# Patient Record
Sex: Female | Born: 1960 | Race: White | Hispanic: No | Marital: Married | Smoking: Never smoker
Health system: Southern US, Community
[De-identification: ages and names within clinical notes are randomized; demographics above are authoritative.]

## PROBLEM LIST (undated history)

## (undated) DIAGNOSIS — F329 Major depressive disorder, single episode, unspecified: Secondary | ICD-10-CM

## (undated) DIAGNOSIS — D649 Anemia, unspecified: Secondary | ICD-10-CM

## (undated) DIAGNOSIS — I10 Essential (primary) hypertension: Secondary | ICD-10-CM

## (undated) DIAGNOSIS — N83209 Unspecified ovarian cyst, unspecified side: Secondary | ICD-10-CM

## (undated) DIAGNOSIS — E119 Type 2 diabetes mellitus without complications: Secondary | ICD-10-CM

## (undated) DIAGNOSIS — J309 Allergic rhinitis, unspecified: Secondary | ICD-10-CM

## (undated) DIAGNOSIS — F32A Depression, unspecified: Secondary | ICD-10-CM

## (undated) DIAGNOSIS — C259 Malignant neoplasm of pancreas, unspecified: Secondary | ICD-10-CM

## (undated) HISTORY — DX: Malignant neoplasm of pancreas, unspecified: C25.9

## (undated) HISTORY — PX: LAMINECTOMY: SHX219

## (undated) HISTORY — PX: OTHER SURGICAL HISTORY: SHX169

## (undated) HISTORY — DX: Anemia, unspecified: D64.9

## (undated) HISTORY — DX: Unspecified ovarian cyst, unspecified side: N83.209

## (undated) HISTORY — DX: Allergic rhinitis, unspecified: J30.9

## (undated) HISTORY — DX: Major depressive disorder, single episode, unspecified: F32.9

## (undated) HISTORY — DX: Depression, unspecified: F32.A

## (undated) HISTORY — DX: Essential (primary) hypertension: I10

## (undated) HISTORY — DX: Type 2 diabetes mellitus without complications: E11.9

---

## 1998-06-29 ENCOUNTER — Encounter: Payer: Self-pay | Admitting: Obstetrics and Gynecology

## 1998-06-29 ENCOUNTER — Ambulatory Visit (HOSPITAL_COMMUNITY): Admission: RE | Admit: 1998-06-29 | Discharge: 1998-06-29 | Payer: Self-pay | Admitting: Obstetrics and Gynecology

## 1999-11-30 ENCOUNTER — Emergency Department (HOSPITAL_COMMUNITY): Admission: EM | Admit: 1999-11-30 | Discharge: 1999-11-30 | Payer: Self-pay | Admitting: Emergency Medicine

## 2000-09-23 ENCOUNTER — Emergency Department (HOSPITAL_COMMUNITY): Admission: EM | Admit: 2000-09-23 | Discharge: 2000-09-23 | Payer: Self-pay | Admitting: Emergency Medicine

## 2003-03-17 ENCOUNTER — Other Ambulatory Visit: Admission: RE | Admit: 2003-03-17 | Discharge: 2003-03-17 | Payer: Self-pay | Admitting: Obstetrics and Gynecology

## 2003-04-14 ENCOUNTER — Ambulatory Visit (HOSPITAL_COMMUNITY): Admission: RE | Admit: 2003-04-14 | Discharge: 2003-04-14 | Payer: Self-pay | Admitting: Obstetrics and Gynecology

## 2004-04-29 ENCOUNTER — Ambulatory Visit (HOSPITAL_COMMUNITY): Admission: RE | Admit: 2004-04-29 | Discharge: 2004-04-29 | Payer: Self-pay | Admitting: Obstetrics and Gynecology

## 2004-06-17 ENCOUNTER — Ambulatory Visit: Payer: Self-pay | Admitting: Internal Medicine

## 2004-12-09 ENCOUNTER — Other Ambulatory Visit: Admission: RE | Admit: 2004-12-09 | Discharge: 2004-12-09 | Payer: Self-pay | Admitting: Obstetrics and Gynecology

## 2005-08-15 ENCOUNTER — Ambulatory Visit: Payer: Self-pay | Admitting: Internal Medicine

## 2005-10-04 ENCOUNTER — Ambulatory Visit: Payer: Self-pay | Admitting: Internal Medicine

## 2005-12-09 ENCOUNTER — Other Ambulatory Visit: Admission: RE | Admit: 2005-12-09 | Discharge: 2005-12-09 | Payer: Self-pay | Admitting: Obstetrics and Gynecology

## 2006-01-02 ENCOUNTER — Ambulatory Visit (HOSPITAL_COMMUNITY): Admission: RE | Admit: 2006-01-02 | Discharge: 2006-01-02 | Payer: Self-pay | Admitting: Obstetrics and Gynecology

## 2006-06-19 ENCOUNTER — Ambulatory Visit: Payer: Self-pay | Admitting: Internal Medicine

## 2006-10-09 ENCOUNTER — Ambulatory Visit: Payer: Self-pay | Admitting: Internal Medicine

## 2006-10-09 LAB — CONVERTED CEMR LAB
Albumin: 4 g/dL (ref 3.5–5.2)
Alkaline Phosphatase: 42 units/L (ref 39–117)
BUN: 7 mg/dL (ref 6–23)
Basophils Relative: 0.5 % (ref 0.0–1.0)
Eosinophils Absolute: 0.7 10*3/uL — ABNORMAL HIGH (ref 0.0–0.6)
GFR calc Af Amer: 101 mL/min
HDL: 88.1 mg/dL (ref >39.0)
Ketones, ur: NEGATIVE mg/dL
Leukocytes, UA: NEGATIVE
Lymphocytes Relative: 28 % (ref 12.0–46.0)
MCV: 98 fL (ref 78.0–100.0)
Monocytes Relative: 9.4 % (ref 3.0–11.0)
Neutro Abs: 3.9 10*3/uL (ref 1.4–7.7)
Platelets: 359 10*3/uL (ref 150–400)
Potassium: 4 meq/L (ref 3.5–5.1)
Specific Gravity, Urine: 1.01 (ref 1.000–1.03)
Total Protein: 6.4 g/dL (ref 6.0–8.3)
Triglycerides: 54 mg/dL (ref 0–149)
Urine Glucose: NEGATIVE mg/dL
Urobilinogen, UA: 0.2 (ref 0.0–1.0)
VLDL: 11 mg/dL (ref 0–40)

## 2006-11-14 ENCOUNTER — Encounter: Payer: Self-pay | Admitting: Internal Medicine

## 2006-12-13 ENCOUNTER — Other Ambulatory Visit: Admission: RE | Admit: 2006-12-13 | Discharge: 2006-12-13 | Payer: Self-pay | Admitting: Obstetrics and Gynecology

## 2007-01-07 DIAGNOSIS — I10 Essential (primary) hypertension: Secondary | ICD-10-CM

## 2007-01-07 DIAGNOSIS — F418 Other specified anxiety disorders: Secondary | ICD-10-CM

## 2007-01-07 DIAGNOSIS — J309 Allergic rhinitis, unspecified: Secondary | ICD-10-CM | POA: Insufficient documentation

## 2007-08-07 ENCOUNTER — Ambulatory Visit: Payer: Self-pay | Admitting: Internal Medicine

## 2007-08-07 ENCOUNTER — Ambulatory Visit: Payer: Self-pay | Admitting: Cardiovascular Disease

## 2007-08-07 DIAGNOSIS — R079 Chest pain, unspecified: Secondary | ICD-10-CM

## 2007-08-07 DIAGNOSIS — R1011 Right upper quadrant pain: Secondary | ICD-10-CM | POA: Insufficient documentation

## 2007-08-07 LAB — CONVERTED CEMR LAB
CO2: 26 meq/L (ref 19–32)
GFR calc Af Amer: 100 mL/min
Glucose, Bld: 86 mg/dL (ref 70–99)
Potassium: 4.7 meq/L (ref 3.5–5.1)
Sodium: 138 meq/L (ref 135–145)

## 2007-08-08 ENCOUNTER — Telehealth: Payer: Self-pay | Admitting: Internal Medicine

## 2007-09-17 ENCOUNTER — Telehealth: Payer: Self-pay | Admitting: Internal Medicine

## 2007-11-02 ENCOUNTER — Telehealth (INDEPENDENT_AMBULATORY_CARE_PROVIDER_SITE_OTHER): Payer: Self-pay | Admitting: *Deleted

## 2007-11-29 ENCOUNTER — Telehealth (INDEPENDENT_AMBULATORY_CARE_PROVIDER_SITE_OTHER): Payer: Self-pay | Admitting: *Deleted

## 2008-02-01 ENCOUNTER — Ambulatory Visit: Payer: Self-pay | Admitting: Internal Medicine

## 2008-02-01 DIAGNOSIS — R51 Headache: Secondary | ICD-10-CM | POA: Insufficient documentation

## 2008-02-01 DIAGNOSIS — M549 Dorsalgia, unspecified: Secondary | ICD-10-CM | POA: Insufficient documentation

## 2008-02-01 DIAGNOSIS — R519 Headache, unspecified: Secondary | ICD-10-CM | POA: Insufficient documentation

## 2008-06-13 ENCOUNTER — Telehealth: Payer: Self-pay | Admitting: Internal Medicine

## 2008-11-10 LAB — CONVERTED CEMR LAB: Pap Smear: NORMAL

## 2008-11-11 ENCOUNTER — Ambulatory Visit: Payer: Self-pay | Admitting: Internal Medicine

## 2008-11-11 DIAGNOSIS — M25519 Pain in unspecified shoulder: Secondary | ICD-10-CM

## 2008-11-18 ENCOUNTER — Other Ambulatory Visit: Admission: RE | Admit: 2008-11-18 | Discharge: 2008-11-18 | Payer: Self-pay | Admitting: Obstetrics and Gynecology

## 2008-11-24 ENCOUNTER — Ambulatory Visit: Payer: Self-pay | Admitting: Internal Medicine

## 2008-11-25 LAB — CONVERTED CEMR LAB
ALT: 23 units/L (ref 0–35)
AST: 25 units/L (ref 0–37)
Albumin: 3.7 g/dL (ref 3.5–5.2)
Alkaline Phosphatase: 29 units/L — ABNORMAL LOW (ref 39–117)
Basophils Relative: 0.2 % (ref 0.0–3.0)
Cholesterol: 179 mg/dL (ref 0–200)
Eosinophils Relative: 7.9 % — ABNORMAL HIGH (ref 0.0–5.0)
GFR calc non Af Amer: 95.78 mL/min (ref >60)
Glucose, Bld: 74 mg/dL (ref 70–99)
HCT: 37.1 % (ref 36.0–46.0)
Hemoglobin: 12.7 g/dL (ref 12.0–15.0)
Leukocytes, UA: NEGATIVE
Lymphs Abs: 2.2 10*3/uL (ref 0.7–4.0)
Monocytes Relative: 6.9 % (ref 3.0–12.0)
Neutro Abs: 3.2 10*3/uL (ref 1.4–7.7)
Nitrite: NEGATIVE
Potassium: 4.2 meq/L (ref 3.5–5.1)
RBC: 3.8 M/uL — ABNORMAL LOW (ref 3.87–5.11)
RDW: 12 % (ref 11.5–14.6)
Sodium: 139 meq/L (ref 135–145)
TSH: 1.29 microintl units/mL (ref 0.35–5.50)
Total Protein, Urine: NEGATIVE mg/dL
Total Protein: 6.1 g/dL (ref 6.0–8.3)
pH: 6 (ref 5.0–8.0)

## 2008-12-01 ENCOUNTER — Ambulatory Visit (HOSPITAL_COMMUNITY): Admission: RE | Admit: 2008-12-01 | Discharge: 2008-12-01 | Payer: Self-pay | Admitting: Obstetrics and Gynecology

## 2010-02-23 ENCOUNTER — Ambulatory Visit: Admit: 2010-02-23 | Payer: Self-pay | Admitting: Internal Medicine

## 2010-03-07 ENCOUNTER — Encounter: Payer: Self-pay | Admitting: Obstetrics and Gynecology

## 2010-03-07 ENCOUNTER — Encounter: Payer: Self-pay | Admitting: Internal Medicine

## 2010-03-18 ENCOUNTER — Other Ambulatory Visit: Payer: Self-pay | Admitting: Obstetrics and Gynecology

## 2010-03-18 DIAGNOSIS — Z1239 Encounter for other screening for malignant neoplasm of breast: Secondary | ICD-10-CM

## 2010-03-23 ENCOUNTER — Ambulatory Visit: Payer: Self-pay

## 2010-03-23 ENCOUNTER — Other Ambulatory Visit (HOSPITAL_COMMUNITY)
Admission: RE | Admit: 2010-03-23 | Discharge: 2010-03-23 | Disposition: A | Discharge status: Home / Self Care | Payer: BC Managed Care – PPO | Source: Ambulatory Visit | Attending: Obstetrics and Gynecology | Admitting: Obstetrics and Gynecology

## 2010-03-23 ENCOUNTER — Other Ambulatory Visit: Payer: Self-pay | Admitting: Nurse Practitioner

## 2010-03-23 DIAGNOSIS — Z1159 Encounter for screening for other viral diseases: Secondary | ICD-10-CM | POA: Insufficient documentation

## 2010-03-23 DIAGNOSIS — Z01419 Encounter for gynecological examination (general) (routine) without abnormal findings: Secondary | ICD-10-CM | POA: Insufficient documentation

## 2010-03-25 ENCOUNTER — Ambulatory Visit: Payer: Self-pay

## 2010-04-06 ENCOUNTER — Ambulatory Visit
Admission: RE | Admit: 2010-04-06 | Discharge: 2010-04-06 | Disposition: A | Discharge status: Home / Self Care | Payer: BC Managed Care – PPO | Source: Ambulatory Visit | Attending: Obstetrics and Gynecology | Admitting: Obstetrics and Gynecology

## 2010-04-06 ENCOUNTER — Other Ambulatory Visit: Payer: Self-pay | Admitting: Obstetrics and Gynecology

## 2010-04-06 DIAGNOSIS — Z1239 Encounter for other screening for malignant neoplasm of breast: Secondary | ICD-10-CM

## 2010-04-08 ENCOUNTER — Encounter: Payer: Self-pay | Admitting: Internal Medicine

## 2010-04-08 ENCOUNTER — Ambulatory Visit (INDEPENDENT_AMBULATORY_CARE_PROVIDER_SITE_OTHER): Payer: BC Managed Care – PPO | Admitting: Internal Medicine

## 2010-04-08 DIAGNOSIS — Z0000 Encounter for general adult medical examination without abnormal findings: Secondary | ICD-10-CM

## 2010-04-13 NOTE — Assessment & Plan Note (Signed)
Summary: last ov 2010 / check blood pressure/med   Vital Signs:  Patient profile:   50 year old female Height:      62 inches Weight:      159 pounds BMI:     29.19 O2 Sat:      95 % on Room air Temp:     98.1 degrees F oral Pulse rate:   111 / minute BP sitting:   110 / 80  (left arm) Cuff size:   regular  Vitals Entered By: Zella Ball Ewing CMA Duncan Dull) (April 08, 2010 2:04 PM)  O2 Flow:  Room air CC: followup on meds/RE   Primary Care Provider:  Corwin Levins MD  CC:  followup on meds/RE.  History of Present Illness: here for wellness, overall doing ok;  Pt denies CP, worsening sob, doe, wheezing, orthopnea, pnd, worsening LE edema, palps, dizziness or syncope  Pt denies new neuro symptoms such as headache, facial or extremity weakness  Pt denies polydipsia, polyuria   Overall good compliance with meds, trying to follow low chol diet, wt stable, little excercise however  Overall good compliance with meds, and good tolerability.  No fever, wt loss, night sweats, loss of appetite or other constitutional symptoms  Denies worsening depressive symptoms, suicidal ideation, or panic, though has ongoing mild anxiety.  Pt states good ability with ADL's, low fall risk, home safety reviewed and adequate, no significant change in hearing or vision, trying to follow lower chol diet, and occasionally active only with regular excercise.  Has gained some wt back that she had lost previously.  Also with wart lesion to left leg not better with OTC treatment but plans to try again, does not want to see derm at this time.   Preventive Screening-Counseling & Management      Drug Use:  no.    Problems Prior to Update: 1)  Back Pain  (ICD-724.5) 2)  Preventive Health Care  (ICD-V70.0) 3)  Shoulder Pain, Left  (ICD-719.41) 4)  Headache  (ICD-784.0) 5)  Back Pain  (ICD-724.5) 6)  Abdominal Pain Right Upper Quadrant  (ICD-789.01) 7)  Rib Pain  (ICD-786.50) 8)  Allergic Rhinitis  (ICD-477.9) 9)   Hypertension  (ICD-401.9) 10)  Depression  (ICD-311)  Medications Prior to Update: 1)  Diovan 320 Mg Tabs (Valsartan) .... Take 1 Tablet By Mouth Once A Day 2)  Fluticasone Propionate 50 Mcg/act Susp (Fluticasone Propionate) .... Spray 2 Spray Into Both  Nostrils Once A Day 3)  Alprazolam 0.25 Mg  Tabs (Alprazolam) .Marland Kitchen.. 1 By Mouth Two Times A Day Prn 4)  Prozac 20 Mg  Caps (Fluoxetine Hcl) .... Take 1 Tablet By Mouth Once A Day 5)  Wellbutrin Sr 150 Mg  Tb12 (Bupropion Hcl) .... Take 1 Tablet By Mouth Once A Day  Current Medications (verified): 1)  Diovan 320 Mg Tabs (Valsartan) .... Take 1 Tablet By Mouth Once A Day 2)  Fluticasone Propionate 50 Mcg/act Susp (Fluticasone Propionate) .... Spray 2 Spray Into Both  Nostrils Once A Day 3)  Alprazolam 0.25 Mg  Tabs (Alprazolam) .Marland Kitchen.. 1 By Mouth Two Times A Day Prn 4)  Prozac 20 Mg  Caps (Fluoxetine Hcl) .... Take 2 By Mouth Once Daily 5)  Wellbutrin Sr 150 Mg  Tb12 (Bupropion Hcl) .... Take 1 Tablet By Mouth Once A Day 6)  Zyrtec Allergy 10 Mg Tabs (Cetirizine Hcl) .Marland Kitchen.. 1 By Mouth Once Daily 7)  Junel 1/20 1-20 Mg-Mcg Tabs (Norethindrone Acet-Ethinyl Est) .Marland Kitchen.. 1 By Mouth  Once Daily 8)  Centrum Silver  Tabs (Multiple Vitamins-Minerals) .Marland Kitchen.. 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 Drug use-no  Risk Factors: Smoking Status: never (01-18-2007)  Past Medical History: Depression - sees Dr Evelene Croon Hypertension Allergic rhinitis  Social History: Never Smoked Alcohol use-yes (7 drinks per week) Married 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         all otherwise negative per pt -    Physical Exam  General:  alert and overweight-appearing.   Head:  normocephalic and atraumatic.   Eyes:  vision grossly intact, pupils equal, and pupils round.   Ears:  R ear normal and L ear normal.   Nose:  no external deformity and no external erythema.   Mouth:  no gingival abnormalities and pharynx pink and moist.   Neck:  supple and no masses.   Lungs:  normal respiratory effort and normal breath sounds.   Heart:  normal rate and regular rhythm.   Abdomen:  soft, non-tender, and normal bowel sounds.   Msk:  no joint tenderness and no joint swelling.  , no spine tenderness or lumbar paravertebral tedner Extremities:  no edema, no erythema  Neurologic:  cranial nerves II-XII intact, strength normal in all extremities, and sensation intact to light touch.   Skin:  color normal and no rashes.  , has approx 8 mm warty lesin to the left medial mid leg Psych:  not depressed appearing and slightly anxious.     Impression & Recommendations:  Problem # 1:  Preventive Health Care (ICD-V70.0) Overall doing well, age appropriate education and counseling updated, referral for preventive services and immunizations addressed, dietary counseling and smoking status adressed , most recent labs reviewed  I have personally reviewed and have noted 1.The patient's medical and social history 2.Their use of alcohol, tobacco or illicit drugs 3.Their current medications and supplements 4. Functional ability including ADL's,  fall risk, home safety risk, hearing & visual impairment  5.Diet and physical activities 6.Evidence for depression or mood disorders The patients weight, height, BMI  have been recorded in the chart I have made referrals, counseling and provided education to the patient based review of the above   Problem # 2:  HYPERTENSION (ICD-401.9)  Her updated medication list for this problem includes:    Diovan 320 Mg Tabs (Valsartan) .Marland Kitchen... Take 1 tablet by mouth once a day  BP today: 110/80 Prior BP: 122/82 (11/24/2008)  Labs Reviewed: K+: 4.2 (11/24/2008) Creat: : 0.7 (11/24/2008)   Chol: 179 (11/24/2008)   HDL: 86.20 (11/24/2008)   LDL: 82 (11/24/2008)   TG: 53.0 (11/24/2008) stable overall by hx and exam, ok to continue meds/tx as is   Problem # 3:  DEPRESSION (ICD-311)  Her updated medication list for this problem includes:    Alprazolam 0.25 Mg Tabs (Alprazolam) .Marland Kitchen... 1 by mouth two times a  day prn    Prozac 20 Mg Caps (Fluoxetine hcl) .Marland Kitchen... Take 2 by mouth once daily    Wellbutrin Sr 150 Mg Tb12 (Bupropion hcl) .Marland Kitchen... Take 1 tablet by mouth once a day  Discussed treatment options  Patient agrees to call if any worsening of symptoms or thoughts of doing harm arise. Verified that the patient has no suicidal ideation at this time.  stable overall by hx and exam, ok to continue meds/tx as is   Complete Medication List: 1)  Diovan 320 Mg Tabs (Valsartan) .... Take 1 tablet by mouth once a day 2)  Fluticasone Propionate 50 Mcg/act Susp (Fluticasone propionate) .... Spray 2 spray into both  nostrils once a day 3)  Alprazolam 0.25 Mg Tabs (Alprazolam) .Marland Kitchen.. 1 by mouth two times a day prn 4)  Prozac 20 Mg Caps (Fluoxetine hcl) .... Take 2 by mouth once daily 5)  Wellbutrin Sr 150 Mg Tb12 (Bupropion hcl) .... Take 1 tablet by mouth once a day 6)  Zyrtec Allergy 10 Mg Tabs (Cetirizine hcl) .Marland Kitchen.. 1 by mouth once daily 7)  Junel 1/20 1-20 Mg-mcg Tabs (Norethindrone acet-ethinyl est) .Marland Kitchen.. 1 by mouth  once daily 8)  Centrum Silver Tabs (Multiple vitamins-minerals) .Marland Kitchen.. 1 by mouth once daily  Patient Instructions: 1)  Continue all previous medications as before this visit  2)  Please have your blood work drawn at the other lab 3)  Please call the number on the [facility] Card for results of your testing  4)  Please call if you would like referral to Dermatology for the skin lesion to the left leg 5)  Please schedule a follow-up appointment in 1 year, or sooner if needed Prescriptions: DIOVAN 320 MG TABS (VALSARTAN) Take 1 tablet by mouth once a day  #90 x 3   Entered and Authorized by:   Corwin Levins MD   Signed by:   Corwin Levins MD on 04/08/2010   Method used:   Print then Give to Patient   RxID:   [id number]    Orders Added: 1)  Est. Patient 40-64 years [[id number]]

## 2010-04-27 ENCOUNTER — Other Ambulatory Visit: Payer: Self-pay | Admitting: Nurse Practitioner

## 2010-05-17 ENCOUNTER — Telehealth: Payer: Self-pay

## 2010-05-17 DIAGNOSIS — L989 Disorder of the skin and subcutaneous tissue, unspecified: Secondary | ICD-10-CM | POA: Insufficient documentation

## 2010-05-17 NOTE — Telephone Encounter (Signed)
Pt called requesting referral to Dermatology as discussed at last OV (02/23). Pt also says that labs that she was going to have done at GYN office were not able to be drawn. Pt is requesting to have labs done at [facility]. Okay for CPX labs V70.0?

## 2010-05-17 NOTE — Telephone Encounter (Signed)
Referral done  [facility] for cpx labs v70.0 - dahlia to order

## 2010-05-18 NOTE — Telephone Encounter (Signed)
Left message on machine for pt to return my call  

## 2010-05-19 NOTE — Telephone Encounter (Signed)
Pt called back, will expect a call from [facility] with referral info. Pt scheduled for labs 04/05 per her request

## 2010-05-19 NOTE — Telephone Encounter (Signed)
Left message on machine for pt to return my call  

## 2010-05-20 ENCOUNTER — Other Ambulatory Visit (INDEPENDENT_AMBULATORY_CARE_PROVIDER_SITE_OTHER): Payer: 59

## 2010-05-20 DIAGNOSIS — Z0000 Encounter for general adult medical examination without abnormal findings: Secondary | ICD-10-CM

## 2010-05-20 LAB — LIPID PANEL
HDL: 70.1 mg/dL (ref >39.00)
Total CHOL/HDL Ratio: 2

## 2010-05-20 LAB — URINALYSIS, ROUTINE W REFLEX MICROSCOPIC
Bilirubin Urine: NEGATIVE
Hgb urine dipstick: NEGATIVE
Ketones, ur: NEGATIVE
pH: 6 (ref 5.0–8.0)

## 2010-05-20 LAB — CBC WITH DIFFERENTIAL/PLATELET
Basophils Absolute: 0 10*3/uL (ref 0.0–0.1)
Eosinophils Absolute: 0.6 10*3/uL (ref 0.0–0.7)
HCT: 39.3 % (ref 36.0–46.0)
Hemoglobin: 13.6 g/dL (ref 12.0–15.0)
Lymphs Abs: 2 10*3/uL (ref 0.7–4.0)
MCHC: 34.7 g/dL (ref 30.0–36.0)
MCV: 97.9 fl (ref 78.0–100.0)
Monocytes Absolute: 0.4 10*3/uL (ref 0.1–1.0)
Monocytes Relative: 6.9 % (ref 3.0–12.0)
Neutro Abs: 2.6 10*3/uL (ref 1.4–7.7)
Platelets: 297 10*3/uL (ref 150.0–400.0)
RDW: 12.3 % (ref 11.5–14.6)

## 2010-05-20 LAB — HEPATIC FUNCTION PANEL
ALT: 30 U/L (ref 0–35)
AST: 23 U/L (ref 0–37)
Alkaline Phosphatase: 38 U/L — ABNORMAL LOW (ref 39–117)
Bilirubin, Direct: 0.2 mg/dL (ref 0.0–0.3)
Total Bilirubin: 1.1 mg/dL (ref 0.3–1.2)

## 2010-05-20 LAB — BASIC METABOLIC PANEL
CO2: 27 mEq/L (ref 19–32)
Calcium: 8.7 mg/dL (ref 8.4–10.5)
Creatinine, Ser: 0.8 mg/dL (ref 0.4–1.2)
GFR: 83.99 mL/min (ref >60.00)
Glucose, Bld: 71 mg/dL (ref 70–99)
Sodium: 139 mEq/L (ref 135–145)

## 2010-05-20 NOTE — Progress Notes (Signed)
Quick Note:  Voice message left on [employer] system - lab is negative, normal or otherwise stable, pt to continue same tx ______ 

## 2010-06-28 ENCOUNTER — Other Ambulatory Visit: Payer: Self-pay | Admitting: Internal Medicine

## 2010-12-31 ENCOUNTER — Ambulatory Visit (INDEPENDENT_AMBULATORY_CARE_PROVIDER_SITE_OTHER): Payer: 59 | Admitting: Internal Medicine

## 2010-12-31 ENCOUNTER — Encounter: Payer: Self-pay | Admitting: Internal Medicine

## 2010-12-31 VITALS — BP 118/82 | HR 88 | Temp 98.1°F

## 2010-12-31 DIAGNOSIS — M545 Low back pain: Secondary | ICD-10-CM

## 2010-12-31 DIAGNOSIS — N390 Urinary tract infection, site not specified: Secondary | ICD-10-CM

## 2010-12-31 LAB — POCT URINALYSIS DIPSTICK
Bilirubin, UA: NEGATIVE
Glucose, UA: NEGATIVE
Ketones, UA: NEGATIVE
Leukocytes, UA: NEGATIVE
Nitrite, UA: NEGATIVE

## 2010-12-31 MED ORDER — NAPROXEN SODIUM 220 MG PO TABS: 220.0000 mg | ORAL_TABLET | Freq: Two times a day (BID) | ORAL | Status: DC

## 2010-12-31 MED ORDER — NAPROXEN SODIUM 220 MG PO TABS: 440.0000 mg | ORAL_TABLET | Freq: Two times a day (BID) | ORAL | Status: AC

## 2010-12-31 NOTE — Patient Instructions (Signed)
It was good to see you today. No UTI on urine sample today - If you develop worsening symptoms or fever, call and we can reconsider antibiotics, but it does not appear necessary to use antibiotics at this time. Anti-inflammatory medications with food twice a day for the next 3-5 days to help back pain symptoms Call if symptoms unimproved or worse for reevaluation as needed

## 2010-12-31 NOTE — Progress Notes (Signed)
  Subjective:    Patient ID: Tamara [name], female    DOB: 02-07-1961, 50 y.o.   MRN: [id number]  Back Pain This is a new problem. The current episode started in the past 7 days. The problem occurs constantly. The problem is unchanged. The pain is present in the sacro-iliac. The quality of the pain is described as aching and cramping. The pain does not radiate. The pain is at a severity of 5/10. The pain is moderate. The pain is the same all the time. The symptoms are aggravated by position and bending. Stiffness is present all day. Associated symptoms include pelvic pain. Pertinent negatives include no abdominal pain, bladder incontinence, dysuria, fever, headaches, leg pain, numbness, paresthesias, perianal numbness, tingling, weakness or weight loss. Risk factors include poor posture. She has tried heat for the symptoms. The treatment provided mild relief.    Past Medical History  Diagnosis Date  . Depression   . Hypertension   . Allergic rhinitis, mild      Review of Systems  Constitutional: Negative for fever and weight loss.  Gastrointestinal: Negative for abdominal pain.  Genitourinary: Positive for pelvic pain. Negative for bladder incontinence and dysuria.  Musculoskeletal: Positive for back pain.  Neurological: Negative for tingling, weakness, numbness, headaches and paresthesias.       Objective:   Physical Exam BP 118/82  Pulse 88  Temp(Src) 98.1 F (36.7 C) (Oral)  SpO2 97% Constitutional: She appears well-developed and well-nourished. No distress.  Cardiovascular: Normal rate, regular rhythm and normal heart sounds.  No murmur heard. No BLE edema. Pulmonary/Chest: Effort normal and breath sounds normal. No respiratory distress. She has no wheezes.  Abdominal: Soft. Bowel sounds are normal. She exhibits no distension. There is no tenderness. no masses Musculoskeletal: Back: full range of motion of thoracic and lumbar spine. mildly tender to palpation over L SI  joint. DTR's are symmetrically intact. Sensation intact in all dermatomes of the lower extremities. Full strength to manual muscle testing. patient is able to heel toe walk without difficulty and ambulates with antalgic gait Psychiatric: She has a normal mood and affect. Her behavior is normal. Judgment and thought content normal.   Lab Results  Component Value Date   WBC 5.6 05/20/2010   HGB 13.6 05/20/2010   HCT 39.3 05/20/2010   PLT 297.0 05/20/2010   GLUCOSE 71 05/20/2010   CHOL 171 05/20/2010   TRIG 54.0 05/20/2010   HDL 70.10 05/20/2010   LDLCALC 90 05/20/2010   ALT 30 05/20/2010   AST 23 05/20/2010   NA 139 05/20/2010   K 4.9 05/20/2010   CL 103 05/20/2010   CREATININE 0.8 05/20/2010   BUN 11 05/20/2010   CO2 27 05/20/2010   TSH 1.64 05/20/2010   Udip> mod blood (on menses)     Assessment & Plan:  Left low back pain> suspect musculoskeletal versus menses related, possible SI flare No evidence for UTI on urine dip today Advise scheduled anti-inflammatories (otc ok) for next 5 days and heat Hydrates, call if symptoms worse or unimproved

## 2011-01-03 ENCOUNTER — Ambulatory Visit (INDEPENDENT_AMBULATORY_CARE_PROVIDER_SITE_OTHER): Payer: 59 | Admitting: Internal Medicine

## 2011-01-03 ENCOUNTER — Encounter: Payer: Self-pay | Admitting: Internal Medicine

## 2011-01-03 VITALS — BP 110/80 | HR 97 | Temp 97.6°F

## 2011-01-03 DIAGNOSIS — Z0001 Encounter for general adult medical examination with abnormal findings: Secondary | ICD-10-CM | POA: Insufficient documentation

## 2011-01-03 DIAGNOSIS — I10 Essential (primary) hypertension: Secondary | ICD-10-CM

## 2011-01-03 DIAGNOSIS — Z0000 Encounter for general adult medical examination without abnormal findings: Secondary | ICD-10-CM

## 2011-01-03 DIAGNOSIS — M48061 Spinal stenosis, lumbar region without neurogenic claudication: Secondary | ICD-10-CM | POA: Insufficient documentation

## 2011-01-03 DIAGNOSIS — M5416 Radiculopathy, lumbar region: Secondary | ICD-10-CM

## 2011-01-03 DIAGNOSIS — IMO0002 Reserved for concepts with insufficient information to code with codable children: Secondary | ICD-10-CM

## 2011-01-03 DIAGNOSIS — F329 Major depressive disorder, single episode, unspecified: Secondary | ICD-10-CM

## 2011-01-03 DIAGNOSIS — Z719 Counseling, unspecified: Secondary | ICD-10-CM | POA: Insufficient documentation

## 2011-01-03 MED ORDER — OXYCODONE HCL 5 MG PO TABS: 5.0000 mg | ORAL_TABLET | Freq: Four times a day (QID) | ORAL | Status: AC | PRN

## 2011-01-03 MED ORDER — PREDNISONE 10 MG PO TABS: 10.0000 mg | ORAL_TABLET | Freq: Every day | ORAL | Status: AC

## 2011-01-03 MED ORDER — CYCLOBENZAPRINE HCL 5 MG PO TABS: 5.0000 mg | ORAL_TABLET | Freq: Three times a day (TID) | ORAL | Status: AC | PRN

## 2011-01-03 NOTE — Patient Instructions (Addendum)
Take all new medications as prescribed Continue all other medications as before Please call on Friday Nov 23 if not improved, as you will need MRI and Neurosurgury evaluation Please return in 3 mo with Lab testing done 3-5 days before

## 2011-01-07 ENCOUNTER — Telehealth: Payer: Self-pay | Admitting: *Deleted

## 2011-01-07 DIAGNOSIS — M5416 Radiculopathy, lumbar region: Secondary | ICD-10-CM

## 2011-01-07 NOTE — Telephone Encounter (Signed)
Pt advised to expect call form [facility] regarding referral to NS and MRI

## 2011-01-07 NOTE — Telephone Encounter (Signed)
Pt calling back pain is some what improved but still present Your advisement at last visit- Please call on Friday Nov 23 if not improved, as you will need MRI and Neurosurgury evaluation

## 2011-01-07 NOTE — Telephone Encounter (Signed)
This was not unexpected, and will defer to pt  OK for MRI, and NS referrals

## 2011-01-09 ENCOUNTER — Encounter: Payer: Self-pay | Admitting: Internal Medicine

## 2011-01-09 NOTE — Assessment & Plan Note (Signed)
stable overall by hx and exam, most recent data reviewed with pt, and pt to continue medical treatment as before  BP Readings from Last 3 Encounters:  01/03/11 110/80  12/31/10 118/82  04/08/10 110/80

## 2011-01-09 NOTE — Assessment & Plan Note (Signed)
Moderate to severe with some evidence for neuropathy- I think she should have MRI and NS eval, but she wants to try conservative tx with pain med/prednisone trial first

## 2011-01-09 NOTE — Assessment & Plan Note (Signed)
stable overall by hx and exam, most recent data reviewed with pt, and pt to continue medical treatment as before  Lab Results  Component Value Date   WBC 5.6 05/20/2010   HGB 13.6 05/20/2010   HCT 39.3 05/20/2010   PLT 297.0 05/20/2010   GLUCOSE 71 05/20/2010   CHOL 171 05/20/2010   TRIG 54.0 05/20/2010   HDL 70.10 05/20/2010   LDLCALC 90 05/20/2010   ALT 30 05/20/2010   AST 23 05/20/2010   NA 139 05/20/2010   K 4.9 05/20/2010   CL 103 05/20/2010   CREATININE 0.8 05/20/2010   BUN 11 05/20/2010   CO2 27 05/20/2010   TSH 1.64 05/20/2010

## 2011-01-09 NOTE — Progress Notes (Signed)
  Subjective:    Patient ID: Tamara [name], female    DOB: [birth year], 50 y.o.   MRN: [id number]  HPI  Here to f/u- Pt c/o left LBP with increased dull pain for 3 days mod to severe, bowel or bladder change, fever, wt loss,  worsening LE pain/numbness/weakness, gait change or falls, but does have LLE radiation with numbness to the calf, and not better with heat pad, alleve, and better with sitting and rest, worse with getting up from sitting.  Pt denies chest pain, increased sob or doe, wheezing, orthopnea, PND, increased LE swelling, palpitations, dizziness or syncope.  Pt denies new neurological symptoms such as new headache, or facial or extremity weakness or numbness except for the above.   Pt denies polydipsia, polyuria.  And  Denies urinary symptoms such as dysuria, frequency, urgency,or hematuria.  Denies worsening depressive symptoms, suicidal ideation, or panic symptoms.  Overall good compliance with treatment, and good medicine tolerability.  Past Medical History  Diagnosis Date  . Depression   . Hypertension   . Allergic rhinitis, mild    No past surgical history on file.  has an unknown smoking status. She does not have any smokeless tobacco history on file. Her alcohol and drug histories not on file. family history is not on file. No Known Allergies Current Outpatient Prescriptions on File Prior to Visit  Medication Sig Dispense Refill  . buPROPion (WELLBUTRIN SR) 150 MG 12 hr tablet Take 150 mg by mouth 2 (two) times daily.        Marland Kitchen FLUoxetine (PROZAC) 20 MG capsule Take 20 mg by mouth daily.        . fluticasone (FLONASE) 50 MCG/ACT nasal spray instill 2 sprays into each nostril once daily  16 g  9  . naproxen sodium (ALEVE) 220 MG tablet Take 2 tablets (440 mg total) by mouth 2 (two) times daily with a meal. x5 days then as needed      . valsartan (DIOVAN) 320 MG tablet Take 320 mg by mouth daily.         Review of Systems Review of Systems  Constitutional: Negative for  diaphoresis and unexpected weight change.  HENT: Negative for drooling and tinnitus.   Eyes: Negative for photophobia and visual disturbance.  Respiratory: Negative for choking and stridor.   Gastrointestinal: Negative for vomiting and blood in stool.  Genitourinary: Negative for hematuria and decreased urine volume.    Objective:   Physical Exam BP 110/80  Pulse 97  Temp(Src) 97.6 F (36.4 C) (Oral)  SpO2 97%  LMP 12/28/2010 Physical Exam  VS noted, not ill appearing, though uncomfortable Constitutional: Pt appears well-developed and well-nourished.  HENT: Head: Normocephalic.  Right Ear: External ear normal.  Left Ear: External ear normal.  Eyes: Conjunctivae and EOM are normal. Pupils are equal, round, and reactive to light.  Neck: Normal range of motion. Neck supple.  Cardiovascular: Normal rate and regular rhythm.   Pulmonary/Chest: Effort normal and breath sounds normal.  Abd:  Soft, NT, non-distended, + BS Neurological: Pt is alert. No cranial nerve deficit. motor/dtr/gait intact except for LLE distal 4+5 motor, and mild decreased sens to LT, left slr equivocal Skin: Skin is warm. No erythema.  Psychiatric: Pt behavior is normal. Thought content normal. not depressed affect/1+ nervous    Assessment & Plan:

## 2011-01-10 ENCOUNTER — Ambulatory Visit (HOSPITAL_COMMUNITY)
Admission: RE | Admit: 2011-01-10 | Discharge: 2011-01-10 | Disposition: A | Discharge status: Home / Self Care | Payer: 59 | Source: Ambulatory Visit | Attending: Internal Medicine | Admitting: Internal Medicine

## 2011-01-10 DIAGNOSIS — M5126 Other intervertebral disc displacement, lumbar region: Secondary | ICD-10-CM | POA: Insufficient documentation

## 2011-01-10 DIAGNOSIS — M545 Low back pain, unspecified: Secondary | ICD-10-CM | POA: Insufficient documentation

## 2011-01-10 DIAGNOSIS — M5416 Radiculopathy, lumbar region: Secondary | ICD-10-CM

## 2011-01-10 DIAGNOSIS — M25559 Pain in unspecified hip: Secondary | ICD-10-CM | POA: Insufficient documentation

## 2011-01-10 DIAGNOSIS — M79609 Pain in unspecified limb: Secondary | ICD-10-CM | POA: Insufficient documentation

## 2011-01-10 DIAGNOSIS — IMO0002 Reserved for concepts with insufficient information to code with codable children: Secondary | ICD-10-CM | POA: Insufficient documentation

## 2011-01-12 ENCOUNTER — Other Ambulatory Visit (HOSPITAL_COMMUNITY): Payer: 59

## 2011-01-17 ENCOUNTER — Telehealth: Payer: Self-pay

## 2011-01-17 MED ORDER — PREDNISONE 10 MG PO TABS: ORAL_TABLET | ORAL | Status: DC

## 2011-01-17 NOTE — Telephone Encounter (Signed)
Pt states that prednisone has helped with back pain. She has recently ran out and the pain has increased down the length of her entire LT leg with some numbness Pt is requesting a refill of prednisone to last until appt, no date has been made yet.

## 2011-01-17 NOTE — Telephone Encounter (Signed)
Ok for refill this time only

## 2011-01-17 NOTE — Telephone Encounter (Signed)
Pt called to check the status of Neurosurgery referral. Pt also has follow up questions regarding appt.

## 2011-01-17 NOTE — Telephone Encounter (Signed)
Patient informed. 

## 2011-03-17 ENCOUNTER — Other Ambulatory Visit: Payer: Self-pay | Admitting: Internal Medicine

## 2011-06-28 ENCOUNTER — Other Ambulatory Visit: Payer: Self-pay | Admitting: Internal Medicine

## 2012-03-05 ENCOUNTER — Encounter: Payer: Self-pay | Admitting: Internal Medicine

## 2012-03-05 ENCOUNTER — Ambulatory Visit (INDEPENDENT_AMBULATORY_CARE_PROVIDER_SITE_OTHER): Payer: Managed Care, Other (non HMO) | Admitting: Internal Medicine

## 2012-03-05 VITALS — BP 102/70 | HR 84 | Temp 97.7°F | Ht 62.0 in | Wt 154.5 lb

## 2012-03-05 DIAGNOSIS — J209 Acute bronchitis, unspecified: Secondary | ICD-10-CM

## 2012-03-05 DIAGNOSIS — R062 Wheezing: Secondary | ICD-10-CM

## 2012-03-05 DIAGNOSIS — I10 Essential (primary) hypertension: Secondary | ICD-10-CM

## 2012-03-05 DIAGNOSIS — Z0000 Encounter for general adult medical examination without abnormal findings: Secondary | ICD-10-CM

## 2012-03-05 MED ORDER — HYDROCODONE-HOMATROPINE 5-1.5 MG/5ML PO SYRP: 5.0000 mL | ORAL_SOLUTION | Freq: Four times a day (QID) | ORAL | Status: DC | PRN

## 2012-03-05 MED ORDER — LEVOFLOXACIN 250 MG PO TABS: 250.0000 mg | ORAL_TABLET | Freq: Every day | ORAL | Status: DC

## 2012-03-05 MED ORDER — PREDNISONE 10 MG PO TABS: ORAL_TABLET | ORAL | Status: DC

## 2012-03-05 NOTE — Assessment & Plan Note (Signed)
Very mild, liekly related to current infectiuos illness, for predpack asd

## 2012-03-05 NOTE — Patient Instructions (Addendum)
Please take all new medication as prescribed Please continue all other medications as before Thank you for enrolling in MyChart. Please follow the instructions below to securely access your online medical record. MyChart allows you to send messages to your doctor, view your test results, renew your prescriptions, schedule appointments, and more. To Log into My Chart online, please go by [employer] or [employer] to [email or web address], or download the MyChart App from the [employer] of [employer] .  Your Username is: [email or web address] (pass pensato) Please send a practice Message on Mychart later today. Please return in December 2014, or sooner if needed, with Lab testing done 3-5 days before

## 2012-03-05 NOTE — Assessment & Plan Note (Signed)
stable overall by history and exam, recent data reviewed with pt, and pt to continue medical treatment as before,  to f/u any worsening symptoms or concerns BP Readings from Last 3 Encounters:  03/05/12 102/70  01/03/11 110/80  12/31/10 118/82

## 2012-03-05 NOTE — Assessment & Plan Note (Signed)
Mild to mod, for antibx course,  to f/u any worsening symptoms or concerns 

## 2012-03-05 NOTE — Progress Notes (Signed)
  Subjective:    Patient ID: Tamara [name], female    DOB: 1960-07-18, 52 y.o.   MRN: [id number]  HPI  Here with acute onset mild to mod6-7 days ST, HA, general weakness and malaise, with prod cough greenish sputum, but Pt denies chest pain, increased sob or doe, wheezing, orthopnea, PND, increased LE swelling, palpitations, dizziness or syncopel except for onset mild wheeze last PM with mild sense of DOE.  Pt denies polydipsia, polyuria.  Denies worsening depressive symptoms, suicidal ideation, or panic, no other acute symptoms    Past Medical History  Diagnosis Date  . Depression   . Hypertension   . Allergic rhinitis, mild    No past surgical history on file.  has an unknown smoking status. She does not have any smokeless tobacco history on file. Her alcohol and drug histories not on file. family history is not on file. No Known Allergies Current Outpatient Prescriptions on File Prior to Visit  Medication Sig Dispense Refill  . buPROPion (WELLBUTRIN SR) 150 MG 12 hr tablet Take 150 mg by mouth 2 (two) times daily.        Marland Kitchen DIOVAN 320 MG tablet take 1 tablet by mouth once daily  90 tablet  3  . FLUoxetine (PROZAC) 20 MG capsule Take 20 mg by mouth daily.        . fluticasone (FLONASE) 50 MCG/ACT nasal spray instill 2 sprays into each nostril once daily  16 g  9   Review of Systems  Constitutional: Negative for unexpected weight change, or unusual diaphoresis  HENT: Negative for tinnitus.   Eyes: Negative for photophobia and visual disturbance.  Respiratory: Negative for choking and stridor.   Gastrointestinal: Negative for vomiting and blood in stool.  Genitourinary: Negative for hematuria and decreased urine volume.  Musculoskeletal: Negative for acute joint swelling Skin: Negative for color change and wound.  Neurological: Negative for tremors and numbness other than noted  Psychiatric/Behavioral: Negative for decreased concentration or  hyperactivity.       Objective:   Physical Exam BP 102/70  Pulse 84  Temp 97.7 F (36.5 C) (Oral)  Ht 5\' 2"  (1.575 m)  Wt 154 lb 8 oz (70.081 kg)  BMI 28.26 kg/m2  SpO2 96% VS noted,  Constitutional: Pt appears well-developed and well-nourished.  HENT: Head: NCAT.  Right Ear: External ear normal.  Left Ear: External ear normal.  Eyes: Conjunctivae and EOM are normal. Pupils are equal, round, and reactive to light.  Neck: Normal range of motion. Neck supple.  Cardiovascular: Normal rate and regular rhythm.   Pulmonary/Chest: Effort normal and breath sounds with mild bilat decreased BS, + mild wheeze.  - no rales Neurological: Pt is alert. Not confused  Skin: Skin is warm. No erythema.  Psychiatric: Pt behavior is normal. Thought content normal. mild nervous, not depressed affect    Assessment & Plan:

## 2012-03-06 ENCOUNTER — Encounter: Payer: Self-pay | Admitting: Internal Medicine

## 2012-03-12 ENCOUNTER — Ambulatory Visit (INDEPENDENT_AMBULATORY_CARE_PROVIDER_SITE_OTHER): Payer: Managed Care, Other (non HMO) | Admitting: Internal Medicine

## 2012-03-12 ENCOUNTER — Encounter: Payer: Self-pay | Admitting: Internal Medicine

## 2012-03-12 ENCOUNTER — Telehealth: Payer: Self-pay | Admitting: Internal Medicine

## 2012-03-12 VITALS — BP 110/82 | HR 84 | Temp 97.6°F | Ht 62.0 in | Wt 154.5 lb

## 2012-03-12 DIAGNOSIS — J019 Acute sinusitis, unspecified: Secondary | ICD-10-CM

## 2012-03-12 MED ORDER — FLUTICASONE PROPIONATE 50 MCG/ACT NA SUSP: 2.0000 | Freq: Every day | NASAL | Status: DC

## 2012-03-12 MED ORDER — AZITHROMYCIN 250 MG PO TABS: ORAL_TABLET | ORAL | Status: DC

## 2012-03-12 NOTE — Progress Notes (Signed)
HPI  Pt presents to the clinic today with c/o of nasal congestion, fatigue, sinus pain and pressure and cough since 03/05/2012. She was seen by Dr. Jonny Ruiz who diagnosed her with bronchitis. She was prescribed Levaquin and prednisone. She states that she really isn't feeling any better. She has tried using her flonase with some relief. She is coughing constantly but it does not have a color to it. The cough is worse at night. She is using the Hycodan that Dr. Jonny Ruiz gave her at night but it seems to make her a little dizzy. She has been out of work because she is afraid she is contagious. She has had sick contacts.  Review of Systems    Past Medical History  Diagnosis Date  . Depression   . Hypertension   . Allergic rhinitis, mild     History reviewed. No pertinent family history.  History   Social History  . Marital Status: Married    Spouse Name: N/A    Number of Children: N/A  . Years of Education: N/A   Occupational History  . Not on file.   Social History Main Topics  . Smoking status: Never Smoker   . Smokeless tobacco: Not on file  . Alcohol Use: Not on file  . Drug Use: Not on file  . Sexually Active: Not on file   Other Topics Concern  . Not on file   Social History Narrative  . No narrative on file    No Known Allergies   Constitutional: Positive headache, fatigue. Denies fever or abrupt weight changes.  HEENT:  Positive eye pain, pressure behind the eyes, facial pain, nasal congestion and sore throat. Denies eye redness, ear pain, ringing in the ears, wax buildup, runny nose or bloody nose. Respiratory: Positive cough. Denies difficulty breathing or shortness of breath.  Cardiovascular: Denies chest pain, chest tightness, palpitations or swelling in the hands or feet.   No other specific complaints in a complete review of systems (except as listed in HPI above).  Objective:    BP 110/82  Pulse 84  Temp 97.6 F (36.4 C) (Oral)  Ht 5\' 2"  (1.575 m)  Wt 154  lb 8 oz (70.081 kg)  BMI 28.26 kg/m2  SpO2 99% Wt Readings from Last 3 Encounters:  03/12/12 154 lb 8 oz (70.081 kg)  03/05/12 154 lb 8 oz (70.081 kg)  04/08/10 159 lb (72.122 kg)    General: Appears her stated age, well developed, well nourished in NAD. HEENT: Head: normal shape and size; Eyes: sclera white, no icterus, conjunctiva pink, PERRLA and EOMs intact; Ears: Tm's gray and intact, normal light reflex; Nose: mucosa pink and moist, septum midline; Throat/Mouth: + PND. Teeth present, mucosa erythematous and moist, no exudate noted, no lesions or ulcerations noted.  Neck: Mild cervical lymphadenopathy. Neck supple, trachea midline. No massses, lumps or thyromegaly present.  Cardiovascular: Normal rate and rhythm. S1,S2 noted.  No murmur, rubs or gallops noted. No JVD or BLE edema. No carotid bruits noted. Pulmonary/Chest: Normal effort and positive vesicular breath sounds. No respiratory distress. No wheezes, rales or ronchi noted.      Assessment & Plan:   Acute bacterial sinusitis  Can use a Neti Pot which can be purchased from your local drug store. Flonase 2 sprays each nostril for 3 days and then as needed. Azithromax x 5 days  Continue Hycodan cough syrup at night I would get OTC Delsym during the day  RTC as needed or if symptoms persist.

## 2012-03-12 NOTE — Patient Instructions (Signed)

## 2012-03-12 NOTE — Telephone Encounter (Signed)
Patient Information:  Caller Name: Cassanda  Phone: [phone]  Patient: Tamara, [name]  Gender: Female  DOB: [birth year]  Age: 52 Years  PCP: Oliver Barre (Adults only)  Pregnant: No  Office Follow Up:  Does the office need to follow up with this patient?: No  Instructions For The Office: N/A   Symptoms  Reason For Call & Symptoms: Cough; on day 9 of 10 days of antibiotics and not feeling better.  Frequent cough producing clear to yellow sputum.  Nasal drainage clear to yellow. Occasional wheezing. - no worse than when she was seen.  Reviewed Health History In EMR: Yes  Reviewed Medications In EMR: Yes  Reviewed Allergies In EMR: Yes  Reviewed Surgeries / Procedures: Yes  Date of Onset of Symptoms: 03/05/2012  Treatments Tried: Antibiotics, Steroids, Cough Medication.  Treatments Tried Worked: No OB / GYN:  LMP: 02/22/2012  Guideline(s) Used:  Cough  Disposition Per Guideline:   See Today in Office  Reason For Disposition Reached:   Sinus pain persists after using nasal washes and pain medicine > 24 hours  Advice Given:  N/A  Appointment Scheduled:  03/12/2012 10:15:00 Appointment Scheduled Provider:  Nicki Reaper

## 2012-03-16 ENCOUNTER — Telehealth: Payer: Self-pay | Admitting: Internal Medicine

## 2012-03-16 NOTE — Telephone Encounter (Signed)
Message copied by Etheleen Sia on Fri Mar 16, 2012  3:43 PM ------      Message from: Etheleen Sia      Created: Tue Mar 06, 2012  2:34 PM      Regarding: FW: due for ROV in dec 2014       [facility] TO CALL 1-21      ----- Message -----         From: Corwin Levins, MD         Sent: 03/05/2012   5:08 PM           To: Etheleen Sia      Subject: due for ROV in dec 2014                                  Please call pt

## 2012-03-29 ENCOUNTER — Other Ambulatory Visit: Payer: Self-pay | Admitting: Internal Medicine

## 2012-03-31 ENCOUNTER — Other Ambulatory Visit: Payer: Self-pay

## 2012-03-31 NOTE — Telephone Encounter (Signed)
Please advise the patient to schedule an appt for a yearly physical with Dr. Jonny Ruiz. The appointment is needed for additional refills.

## 2012-07-05 ENCOUNTER — Other Ambulatory Visit: Payer: Self-pay | Admitting: Internal Medicine

## 2012-07-25 ENCOUNTER — Other Ambulatory Visit (HOSPITAL_COMMUNITY)
Admission: RE | Admit: 2012-07-25 | Discharge: 2012-07-25 | Disposition: A | Discharge status: Home / Self Care | Payer: Managed Care, Other (non HMO) | Source: Ambulatory Visit | Attending: Obstetrics and Gynecology | Admitting: Obstetrics and Gynecology

## 2012-07-25 ENCOUNTER — Other Ambulatory Visit: Payer: Self-pay

## 2012-07-25 ENCOUNTER — Other Ambulatory Visit: Payer: Self-pay | Admitting: Obstetrics and Gynecology

## 2012-07-25 DIAGNOSIS — Z1231 Encounter for screening mammogram for malignant neoplasm of breast: Secondary | ICD-10-CM

## 2012-07-25 DIAGNOSIS — Z01419 Encounter for gynecological examination (general) (routine) without abnormal findings: Secondary | ICD-10-CM | POA: Insufficient documentation

## 2012-08-29 ENCOUNTER — Ambulatory Visit: Payer: Managed Care, Other (non HMO)

## 2012-11-10 ENCOUNTER — Other Ambulatory Visit: Payer: Self-pay | Admitting: Internal Medicine

## 2012-11-12 NOTE — Telephone Encounter (Signed)
Refill done.  

## 2012-12-20 ENCOUNTER — Other Ambulatory Visit: Payer: Self-pay

## 2013-02-12 ENCOUNTER — Encounter: Payer: Self-pay | Admitting: Internal Medicine

## 2013-02-12 ENCOUNTER — Ambulatory Visit (INDEPENDENT_AMBULATORY_CARE_PROVIDER_SITE_OTHER): Payer: Managed Care, Other (non HMO) | Admitting: Internal Medicine

## 2013-02-12 VITALS — BP 132/80 | HR 91 | Temp 97.2°F | Ht 62.0 in | Wt 137.0 lb

## 2013-02-12 DIAGNOSIS — Z0000 Encounter for general adult medical examination without abnormal findings: Secondary | ICD-10-CM

## 2013-02-12 DIAGNOSIS — H6692 Otitis media, unspecified, left ear: Secondary | ICD-10-CM | POA: Insufficient documentation

## 2013-02-12 DIAGNOSIS — I10 Essential (primary) hypertension: Secondary | ICD-10-CM

## 2013-02-12 DIAGNOSIS — H6690 Otitis media, unspecified, unspecified ear: Secondary | ICD-10-CM

## 2013-02-12 DIAGNOSIS — F329 Major depressive disorder, single episode, unspecified: Secondary | ICD-10-CM

## 2013-02-12 MED ORDER — LEVOFLOXACIN 250 MG PO TABS: 250.0000 mg | ORAL_TABLET | Freq: Every day | ORAL | Status: DC

## 2013-02-12 NOTE — Progress Notes (Signed)
   Subjective:    Patient ID: Tamara [name], female    DOB: 12-Jan-1961, 52 y.o.   MRN: [id number]  HPI   Here with 2-3 days acute onset fever, left ear pain, pressure, headache, popping and crackling, general weakness and malaise, and with mild ST and cough, but pt denies chest pain, wheezing, increased sob or doe, orthopnea, PND, increased LE swelling, palpitations, dizziness or syncope.  Pt denies new neurological symptoms such as new headache, or facial or extremity weakness or numbness   Pt denies polydipsia, polyuria,   Has lost signficant wt, LBP better. Denies worsening depressive symptoms, suicidal ideation, or panic; Past Medical History  Diagnosis Date  . Depression   . Hypertension   . Allergic rhinitis, mild    No past surgical history on file.  reports that she has never smoked. She does not have any smokeless tobacco history on file. Her alcohol and drug histories are not on file. family history is not on file. No Known Allergies Current Outpatient Prescriptions on File Prior to Visit  Medication Sig Dispense Refill  . buPROPion (WELLBUTRIN SR) 150 MG 12 hr tablet Take 150 mg by mouth 2 (two) times daily.        Marland Kitchen DIOVAN 320 MG tablet take 1 tablet by mouth once daily  90 tablet  0  . FLUoxetine (PROZAC) 20 MG capsule Take 20 mg by mouth daily.        . fluticasone (FLONASE) 50 MCG/ACT nasal spray Place 2 sprays into the nose daily.  16 g  9   No current facility-administered medications on file prior to visit.     Review of Systems  Constitutional: Negative for unexpected weight change, or unusual diaphoresis  HENT: Negative for tinnitus.   Eyes: Negative for photophobia and visual disturbance.  Respiratory: Negative for choking and stridor.   Gastrointestinal: Negative for vomiting and blood in stool.  Genitourinary: Negative for hematuria and decreased urine volume.  Musculoskeletal: Negative for acute joint swelling Skin: Negative for color change and wound.    Neurological: Negative for tremors and numbness other than noted  Psychiatric/Behavioral: Negative for decreased concentration or  hyperactivity.       Objective:   Physical Exam BP 132/80  Pulse 91  Temp(Src) 97.2 F (36.2 C) (Oral)  Ht 5\' 2"  (1.575 m)  Wt 137 lb (62.143 kg)  BMI 25.05 kg/m2  SpO2 95% VS noted, mild ill Constitutional: Pt appears well-developed and well-nourished.  HENT: Head: NCAT.  Right Ear: External ear normal.  Left Ear: External ear normal.  Left tm's with severe erythema.  Max sinus areas non tender.  Pharynx with mild erythema, no exudate' Eyes: Conjunctivae and EOM are normal. Pupils are equal, round, and reactive to light.  Neck: Normal range of motion. Neck supple.  Cardiovascular: Normal rate and regular rhythm.   Pulmonary/Chest: Effort normal and breath sounds normal.  Neurological: Pt is alert. Not confused  Skin: Skin is warm. No erythema.  Psychiatric: Pt behavior is normal. Thought content normal. not depressed affect     Assessment & Plan:

## 2013-02-12 NOTE — Assessment & Plan Note (Signed)
stable overall by history and exam, recent data reviewed with pt, and pt to continue medical treatment as before,  to f/u any worsening symptoms or concerns Lab Results  Component Value Date   WBC 5.6 05/20/2010   HGB 13.6 05/20/2010   HCT 39.3 05/20/2010   PLT 297.0 05/20/2010   GLUCOSE 71 05/20/2010   CHOL 171 05/20/2010   TRIG 54.0 05/20/2010   HDL 70.10 05/20/2010   LDLCALC 90 05/20/2010   ALT 30 05/20/2010   AST 23 05/20/2010   NA 139 05/20/2010   K 4.9 05/20/2010   CL 103 05/20/2010   CREATININE 0.8 05/20/2010   BUN 11 05/20/2010   CO2 27 05/20/2010   TSH 1.64 05/20/2010

## 2013-02-12 NOTE — Progress Notes (Signed)
Pre-visit discussion using our clinic review tool. No additional management support is needed unless otherwise documented below in the visit note.  

## 2013-02-12 NOTE — Assessment & Plan Note (Signed)
Mild to mod, for antibx course,  to f/u any worsening symptoms or concerns 

## 2013-02-12 NOTE — Patient Instructions (Addendum)
Please take all new medication as prescribed  - the antibiotic Please continue all other medications as before  You can also take Delsym OTC for cough, and/or Mucinex (or it's generic off brand) for congestion and ear clicking/popping, and tylenol as needed for pain.  Please return in 6 months, or sooner if needed, with Lab testing done 3-5 days before

## 2013-02-12 NOTE — Assessment & Plan Note (Signed)
stable overall by history and exam, recent data reviewed with pt, and pt to continue medical treatment as before,  to f/u any worsening symptoms or concerns BP Readings from Last 3 Encounters:  02/12/13 132/80  03/12/12 110/82  03/05/12 102/70

## 2013-02-20 ENCOUNTER — Other Ambulatory Visit: Payer: Self-pay | Admitting: Internal Medicine

## 2013-03-06 ENCOUNTER — Telehealth: Payer: Self-pay | Admitting: Internal Medicine

## 2013-03-06 MED ORDER — LEVOFLOXACIN 250 MG PO TABS: 250.0000 mg | ORAL_TABLET | Freq: Every day | ORAL | Status: DC

## 2013-03-06 NOTE — Telephone Encounter (Signed)
Patient called stating her symptoms have came back and would like a refill on  levofloxacin (LEVAQUIN) 250 MG tablet  Please advise

## 2013-03-06 NOTE — Telephone Encounter (Signed)
Done erx 

## 2013-03-06 NOTE — Telephone Encounter (Signed)
Patient informed. 

## 2013-04-03 ENCOUNTER — Encounter: Payer: Self-pay | Admitting: Family Medicine

## 2013-04-03 ENCOUNTER — Encounter: Payer: Self-pay | Admitting: Internal Medicine

## 2013-04-03 ENCOUNTER — Ambulatory Visit (INDEPENDENT_AMBULATORY_CARE_PROVIDER_SITE_OTHER): Payer: Managed Care, Other (non HMO) | Admitting: Family Medicine

## 2013-04-03 ENCOUNTER — Ambulatory Visit (INDEPENDENT_AMBULATORY_CARE_PROVIDER_SITE_OTHER): Payer: Managed Care, Other (non HMO) | Admitting: Internal Medicine

## 2013-04-03 VITALS — BP 122/80 | HR 82 | Temp 98.8°F | Resp 16 | Wt 144.0 lb

## 2013-04-03 VITALS — BP 122/80 | HR 82 | Temp 98.8°F | Ht 61.5 in | Wt 144.0 lb

## 2013-04-03 DIAGNOSIS — M7550 Bursitis of unspecified shoulder: Secondary | ICD-10-CM | POA: Insufficient documentation

## 2013-04-03 DIAGNOSIS — M25519 Pain in unspecified shoulder: Secondary | ICD-10-CM

## 2013-04-03 DIAGNOSIS — M25512 Pain in left shoulder: Secondary | ICD-10-CM

## 2013-04-03 DIAGNOSIS — M67919 Unspecified disorder of synovium and tendon, unspecified shoulder: Secondary | ICD-10-CM

## 2013-04-03 DIAGNOSIS — M719 Bursopathy, unspecified: Secondary | ICD-10-CM

## 2013-04-03 DIAGNOSIS — M549 Dorsalgia, unspecified: Secondary | ICD-10-CM

## 2013-04-03 DIAGNOSIS — M25511 Pain in right shoulder: Secondary | ICD-10-CM

## 2013-04-03 MED ORDER — MELOXICAM 15 MG PO TABS: 15.0000 mg | ORAL_TABLET | Freq: Every day | ORAL | Status: DC

## 2013-04-03 NOTE — Progress Notes (Signed)
[name] [address], [place] [address] Phone: [phone] Subjective:    I'm seeing this patient by the request  of:  Cathlean Cower, MD   CC: bilateral shoulder pain.   [id number] Tamara [name] is a 53 y.o. female coming in with complaint of bilateral shoulder pain and upper back pain. 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 Patient also has a past medical history significant for scoliosis. Patient states that she has had upper back pain for quite some time. Patient states that she thinks this is secondary to her breast tissue. Patient is going to discuss breast reduction surgery: See if anything else can be done. Patient states that she is having bilateral shoulder pain. Patient has been taking up swimming since after her lower back surgery and states since that time she has noticed some more difficulty with raising her arms above her head as well as reaching behind her back. Patient states that also reaching to the back seat in her car can be painful. Patient denies any radiation down the arm, denies any numbness or tingling denies any loss of strength.     Past medical history, social, surgical and family history all reviewed in electronic medical record.   Review of Systems: No headache, visual changes, nausea, vomiting, diarrhea, constipation, dizziness, abdominal pain, skin rash, fevers, chills, night sweats, weight loss, swollen lymph nodes, body aches, joint swelling, muscle aches, chest pain, shortness of breath, mood changes.   Objective Blood pressure 122/80, pulse 82, temperature 98.8 F (37.1 C), temperature source Oral, resp. rate 16, weight 144 lb (65.318 kg), SpO2 95.00%.  General: No apparent distress alert and oriented x3 mood and affect normal, dressed appropriately.  HEENT: Pupils equal, extraocular movements intact    Respiratory: Patient's speak in full sentences and does not appear short of breath  Cardiovascular: No lower extremity edema, non tender, no erythema  Skin: Warm dry intact with no signs of infection or rash on extremities or on axial skeleton.  Abdomen: Soft nontender  Neuro: Cranial nerves II through XII are intact, neurovascularly intact in all extremities with 2+ DTRs and 2+ pulses.  Lymph: No lymphadenopathy of posterior or anterior cervical chain or axillae bilaterally.  Gait normal with good balance and coordination.  MSK:  Non tender with full range of motion and good stability and symmetric strength and tone of elbows, wrist, hip, knee and ankles bilaterally.  Back examination patient has some mild levoscoliosis of the thoracic spine Shoulder: Bilateral Inspection reveals no abnormalities, atrophy or asymmetry. Palpation is normal with no tenderness over AC joint or bicipital groove. ROM lacks the last 5 actively and forward flexion as well as internal rotation but does have full range of motion passively. Rotator cuff strength normal throughout. signs of impingement with positive Neer and Hawkin's tests, negative empty can sign. Speeds and Yergason's tests normal. No labral pathology noted with negative Obrien's, negative clunk and good stability. Normal scapular function observed. No painful arc and no drop arm sign. No apprehension sign  Procedure: Real-time Ultrasound Guided Injection of left glenohumeral joint Device: GE Logiq E  Ultrasound guided injection is preferred based studies that show increased duration, increased effect, greater accuracy, decreased procedural pain, increased response rate with ultrasound guided versus blind injection.  Verbal informed consent obtained.  Time-out conducted.  Noted no overlying erythema, induration, or other signs of local infection.  Skin prepped in a sterile fashion.  Local anesthesia: Topical Ethyl chloride.  With sterile  technique and under real time ultrasound guidance:  Joint visualized.  23g 1  inch needle inserted posterior approach. Pictures taken for needle placement. Patient did have injection of 2 cc of 1% lidocaine, 2 cc of 0.5% Marcaine, and 1cc of Kenalog 40 mg/dL. Completed without difficulty  Pain immediately resolved suggesting accurate placement of the medication.  Advised to call if fevers/chills, erythema, induration, drainage, or persistent bleeding.  Images permanently stored and available for review in the ultrasound unit.  Impression: Technically successful ultrasound guided injection. Procedure: Real-time Ultrasound Guided Injection of right glenohumeral joint Device: GE Logiq E  Ultrasound guided injection is preferred based studies that show increased duration, increased effect, greater accuracy, decreased procedural pain, increased response rate with ultrasound guided versus blind injection.  Verbal informed consent obtained.  Time-out conducted.  Noted no overlying erythema, induration, or other signs of local infection.  Skin prepped in a sterile fashion.  Local anesthesia: Topical Ethyl chloride.  With sterile technique and under real time ultrasound guidance:  Joint visualized.  23g 1  inch needle inserted posterior approach. Pictures taken for needle placement. Patient did have injection of 2 cc of 1% lidocaine, 2 cc of 0.5% Marcaine, and 1.0 cc of Kenalog 40 mg/dL. Completed without difficulty  Pain immediately resolved suggesting accurate placement of the medication.  Advised to call if fevers/chills, erythema, induration, drainage, or persistent bleeding.  Images permanently stored and available for review in the ultrasound unit.  Impression: Technically successful ultrasound guided injection.      Impression and Recommendations:     This case required medical decision making of moderate complexity.

## 2013-04-03 NOTE — Patient Instructions (Signed)
There is no charge for your visit with me today  Please see the front desk to be checked in for Dr Tamala Julian, to be seen for shoulder pain  You will be contacted regarding the referral for: plastic surgury, but if your shoulder pain improves with Dr Tamala Julian, you may not need the surgury

## 2013-04-03 NOTE — Assessment & Plan Note (Signed)
?   Bursitis, for sport med referral today

## 2013-04-03 NOTE — Assessment & Plan Note (Signed)
Suspect given small overall body size, and breast size out of proportion, most likely related and for referral to plastic surgury to consider breast reduction surgury

## 2013-04-03 NOTE — Patient Instructions (Signed)
Very nice to meet you and thanks for the tips on [facility].  Try exercises most days of the week Work on posture on wall heels, butt, shoulders and head against wall.  Tennis ball between shoulder blades with sitting.  Meloxicam daily for 10 days then as needed Come back in 2 weeks and we can see how you are doing and try some manipulation if needed.

## 2013-04-03 NOTE — Progress Notes (Signed)
Pre-visit discussion using our clinic review tool. No additional management support is needed unless otherwise documented below in the visit note.  

## 2013-04-03 NOTE — Assessment & Plan Note (Signed)
Patient given exercises to help with her upper back pain. I do think patient's breast tissue does contribute to do think that surgery would be significantly unnecessary. Patient was given postural exercises and discussed changing different position in order to can be helpful. Discuss sleeping position. Patient will return in 2-3 weeks and is continuing to have pain will try osteopathic manipulation.

## 2013-04-03 NOTE — Progress Notes (Signed)
   Subjective:    Patient ID: Tamara [name], female    DOB: Sep 25, 1960, 53 y.o.   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  Worse with sleeping.  But also with ongoing months to a year of bilat upper back aching pain persistent as well, bra straps digging in, questions pendulous breasts (for her body size) Past Medical History  Diagnosis Date  . Depression   . Hypertension   . Allergic rhinitis, mild    No past surgical history on file.  reports that she has never smoked. She does not have any smokeless tobacco history on file. Her alcohol and drug histories are not on file. family history is not on file. No Known Allergies Current Outpatient Prescriptions on File Prior to Visit  Medication Sig Dispense Refill  . buPROPion (WELLBUTRIN SR) 150 MG 12 hr tablet Take 150 mg by mouth 2 (two) times daily.        Marland Kitchen FLUoxetine (PROZAC) 20 MG capsule Take 20 mg by mouth daily.        . fluticasone (FLONASE) 50 MCG/ACT nasal spray Place 2 sprays into the nose daily.  16 g  9  . valsartan (DIOVAN) 320 MG tablet take 1 tablet by mouth once daily  90 tablet  3   No current facility-administered medications on file prior to visit.    Review of Systems Not done in detail    Objective:   Physical Exam BP 122/80  Pulse 82  Temp(Src) 98.8 F (37.1 C) (Oral)  Ht 5' 1.5" (1.562 m)  Wt 144 lb (65.318 kg)  BMI 26.77 kg/m2  SpO2 95% bilat shoulders with straps digging in; nontender bilat bursa Spine nontender       Assessment & Plan:

## 2013-04-03 NOTE — Assessment & Plan Note (Signed)
Patient did have injections as described above. Patient tolerated the procedure very well. Home exercise program given Anti-inflammatory burst Discussed icing protocol Return in 2-3 weeks for further evaluation

## 2013-04-17 ENCOUNTER — Ambulatory Visit: Payer: Managed Care, Other (non HMO) | Admitting: Family Medicine

## 2013-04-29 ENCOUNTER — Ambulatory Visit (INDEPENDENT_AMBULATORY_CARE_PROVIDER_SITE_OTHER): Payer: Managed Care, Other (non HMO) | Admitting: Family Medicine

## 2013-04-29 ENCOUNTER — Encounter: Payer: Self-pay | Admitting: Family Medicine

## 2013-04-29 VITALS — BP 110/80 | HR 76

## 2013-04-29 DIAGNOSIS — M999 Biomechanical lesion, unspecified: Secondary | ICD-10-CM

## 2013-04-29 DIAGNOSIS — M719 Bursopathy, unspecified: Secondary | ICD-10-CM

## 2013-04-29 DIAGNOSIS — M7550 Bursitis of unspecified shoulder: Secondary | ICD-10-CM

## 2013-04-29 DIAGNOSIS — M9981 Other biomechanical lesions of cervical region: Secondary | ICD-10-CM

## 2013-04-29 DIAGNOSIS — M549 Dorsalgia, unspecified: Secondary | ICD-10-CM

## 2013-04-29 DIAGNOSIS — M67919 Unspecified disorder of synovium and tendon, unspecified shoulder: Secondary | ICD-10-CM

## 2013-04-29 MED ORDER — MELOXICAM 15 MG PO TABS: 15.0000 mg | ORAL_TABLET | Freq: Every day | ORAL | Status: DC

## 2013-04-29 NOTE — Assessment & Plan Note (Signed)
Patient has made an 80% improvement and is doing very well. Discussed continuing postural exercises and get a new exercises today. Patient will continue this and come back again in 4 weeks for further evaluation. I would like patient to try titrate off of the meloxicam. Patient and will be evaluated again in 4 weeks. If continuing to have some pain we may reconsider ultrasound.

## 2013-04-29 NOTE — Patient Instructions (Signed)
Good to see you neighbor.  Try exercises 3 times a week Continue to work on posture Y-T-A exercises 10 reps daily.  Come back in 4 weeks.

## 2013-04-29 NOTE — Progress Notes (Signed)
  [name] [address], [place] [address] Phone: [phone] Subjective:    CC: bilateral shoulder pain follow up  Tamara [name] is a 53 y.o. female coming in with complaint of bilateral shoulder pain. Patient was seen previously and was given injections in both shoulders for shoulder bursitis. Patient was given home exercise program and anti-inflammatories. Patient states she is doing approximately 80% better. Patient states her shoulders and heard a regular basis and is able to do all activities of daily living without any significant discomfort. Patient continues to take meloxicam daily though. Patient's upper back pain is still there and is mildly improved. Patient still has significant discomfort at the end of the long day. Denies any radiation down the arms. No new symptoms.     Past medical history, social, surgical and family history all reviewed in electronic medical record.   Review of Systems: No headache, visual changes, nausea, vomiting, diarrhea, constipation, dizziness, abdominal pain, skin rash, fevers, chills, night sweats, weight loss, swollen lymph nodes, body aches, joint swelling, muscle aches, chest pain, shortness of breath, mood changes.   Objective Blood pressure 110/80, pulse 76, SpO2 97.00%.  General: No apparent distress alert and oriented x3 mood and affect normal, dressed appropriately.  HEENT: Pupils equal, extraocular movements intact  Respiratory: Patient's speak in full sentences and does not appear short of breath  Cardiovascular: No lower extremity edema, non tender, no erythema  Skin: Warm dry intact with no signs of infection or rash on extremities or on axial skeleton.  Abdomen: Soft nontender  Neuro: Cranial nerves II through XII are intact, neurovascularly intact in all extremities with 2+ DTRs and 2+ pulses.  Lymph: No lymphadenopathy of posterior or anterior cervical chain or axillae  bilaterally.  Gait normal with good balance and coordination.  MSK:  Non tender with full range of motion and good stability and symmetric strength and tone of elbows, wrist, hip, knee and ankles bilaterally.  Back examination patient has some mild levoscoliosis of the thoracic spine Shoulder: Bilateral Inspection reveals no abnormalities, atrophy or asymmetry. Palpation is normal with no tenderness over AC joint or bicipital groove. ROM is full now Rotator cuff strength normal throughout. signs of impingement with positive Neer and Hawkin's tests, negative empty can sign no change from previous exam Speeds and Yergason's tests normal. No labral pathology noted with negative Obrien's, negative clunk and good stability. Normal scapular function observed. No painful arc and no drop arm sign. No apprehension sign   Osteopathic findings C2 flexed rotated and side bent left C5 flexed rotated and side bent right  Thoracic T3 extended rotated and side bent right T7 extended rotated and side bent left   Impression and Recommendations:     This case required medical decision making of moderate complexity.

## 2013-04-29 NOTE — Assessment & Plan Note (Signed)
Sponge very well to osteopathic manipulation. Patient was given other postural exercises and discussed working position that could be beneficial. Patient will be gardening soon which I think will be very helpful. Discuss continuing multivitamin but also getting vitamin D for could be beneficial. Patient will come back again in 4 weeks for further evaluation.

## 2013-04-29 NOTE — Assessment & Plan Note (Signed)
Decision today to treat with OMT was based on Physical Exam  After verbal consent patient was treated with HVLA and muscle energy techniques in cervical and thoracic areas  Patient tolerated the procedure well with improvement in symptoms  Patient given exercises, stretches and lifestyle modifications  See medications in patient instructions if given  Patient will follow up in 4 weeks  

## 2013-04-29 NOTE — Assessment & Plan Note (Signed)
Decision today to treat with OMT was based on Physical Exam  After verbal consent patient was treated with HVLA and muscle energy techniques in cervical and thoracic areas  Patient tolerated the procedure well with improvement in symptoms  Patient given exercises, stretches and lifestyle modifications  See medications in patient instructions if given  Patient will follow up in 4 weeks

## 2013-06-05 ENCOUNTER — Ambulatory Visit: Payer: Managed Care, Other (non HMO) | Admitting: Family Medicine

## 2013-06-05 DIAGNOSIS — Z0289 Encounter for other administrative examinations: Secondary | ICD-10-CM

## 2013-06-12 ENCOUNTER — Encounter: Payer: Self-pay | Admitting: Family Medicine

## 2013-06-12 ENCOUNTER — Ambulatory Visit (INDEPENDENT_AMBULATORY_CARE_PROVIDER_SITE_OTHER): Payer: Managed Care, Other (non HMO) | Admitting: Family Medicine

## 2013-06-12 VITALS — BP 106/76 | HR 98 | Wt 140.0 lb

## 2013-06-12 DIAGNOSIS — M549 Dorsalgia, unspecified: Secondary | ICD-10-CM

## 2013-06-12 DIAGNOSIS — M9981 Other biomechanical lesions of cervical region: Secondary | ICD-10-CM

## 2013-06-12 DIAGNOSIS — M999 Biomechanical lesion, unspecified: Secondary | ICD-10-CM

## 2013-06-12 NOTE — Assessment & Plan Note (Signed)
Decision today to treat with OMT was based on Physical Exam  After verbal consent patient was treated with HVLA and muscle energy techniques in cervical and thoracic areas  Patient tolerated the procedure well with improvement in symptoms  Patient given exercises, stretches and lifestyle modifications  See medications in patient instructions if given  Patient will follow up in 6 weeks     

## 2013-06-12 NOTE — Assessment & Plan Note (Signed)
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 Discuss again the importance of changing her seated position at work as well as doing a posterolateral size is most is the week. We did discuss potential formal physical therapy which patient Caryl Comes. Patient was shown other exercises that could be beneficial. Patient has a new home workout now. Patient will followup again in 6 weeks. Continues to respond well to osteopathic manipulation.  Spent greater than 25 minutes with patient face-to-face and had greater than 50% of counseling including as described above in assessment and plan.

## 2013-06-12 NOTE — Patient Instructions (Signed)
Good to see you.  Continue the exercises 3 times a week. Or at least 2 times a week Tennis ball duct tape together and lay on it for 5 minutes Lets go another 6 weeks.

## 2013-06-12 NOTE — Progress Notes (Signed)
  [name] [address] [place], [place] [address] Phone: [phone] Subjective:    CC: bilateral shoulder pain follow up, OMT  [id number] Tamara [name] is a 53 y.o. female coming in with complaint of bilateral shoulder pain. Patient was seen previously and was given injections in both shoulders for shoulder bursitis. Patient was given home exercise program and anti-inflammatories. Patient also responded well to osteopathic manipulation. She states that her shoulders are feeling terrific. Patient also states that she's been working on her posture but still has quite significant amount of difficulty. Patient states that she does the exercises occasionally. Denies any significant pain overall.     Past medical history, social, surgical and family history all reviewed in electronic medical record.   Review of Systems: No headache, visual changes, nausea, vomiting, diarrhea, constipation, dizziness, abdominal pain, skin rash, fevers, chills, night sweats, weight loss, swollen lymph nodes, body aches, joint swelling, muscle aches, chest pain, shortness of breath, mood changes.   Objective Blood pressure 106/76, pulse 98, weight 140 lb (63.504 kg), SpO2 97.00%.  General: No apparent distress alert and oriented x3 mood and affect normal, dressed appropriately.  HEENT: Pupils equal, extraocular movements intact  Respiratory: Patient's speak in full sentences and does not appear short of breath  Cardiovascular: No lower extremity edema, non tender, no erythema  Skin: Warm dry intact with no signs of infection or rash on extremities or on axial skeleton.  Abdomen: Soft nontender  Neuro: Cranial nerves II through XII are intact, neurovascularly intact in all extremities with 2+ DTRs and 2+ pulses.  Lymph: No lymphadenopathy of posterior or anterior cervical chain or axillae bilaterally.  Gait normal with good balance and coordination.  MSK:  Non tender with  full range of motion and good stability and symmetric strength and tone of shoulders, elbows, wrist, hip, knee and ankles bilaterally.  Back examination patient has some mild levoscoliosis of the thoracic spine    Osteopathic findings C2 flexed rotated and side bent left C6 flexed rotated and side bent right  Thoracic T4 extended rotated and side bent right T6 extended rotated and side bent left   Impression and Recommendations:     This case required medical decision making of moderate complexity.

## 2013-07-23 ENCOUNTER — Ambulatory Visit: Payer: Managed Care, Other (non HMO) | Admitting: Family

## 2013-07-24 ENCOUNTER — Ambulatory Visit: Payer: Managed Care, Other (non HMO) | Admitting: Family Medicine

## 2013-07-24 DIAGNOSIS — Z0289 Encounter for other administrative examinations: Secondary | ICD-10-CM

## 2013-07-31 ENCOUNTER — Encounter: Payer: Self-pay | Admitting: Internal Medicine

## 2013-07-31 ENCOUNTER — Other Ambulatory Visit (INDEPENDENT_AMBULATORY_CARE_PROVIDER_SITE_OTHER): Payer: Managed Care, Other (non HMO)

## 2013-07-31 ENCOUNTER — Ambulatory Visit (INDEPENDENT_AMBULATORY_CARE_PROVIDER_SITE_OTHER): Payer: Managed Care, Other (non HMO) | Admitting: Internal Medicine

## 2013-07-31 VITALS — BP 120/80 | HR 82 | Temp 98.1°F | Ht 61.5 in | Wt 139.2 lb

## 2013-07-31 DIAGNOSIS — Z0000 Encounter for general adult medical examination without abnormal findings: Secondary | ICD-10-CM

## 2013-07-31 LAB — URINALYSIS, ROUTINE W REFLEX MICROSCOPIC
Bilirubin Urine: NEGATIVE
Hgb urine dipstick: NEGATIVE
Ketones, ur: NEGATIVE
Leukocytes, UA: NEGATIVE
NITRITE: NEGATIVE
RBC / HPF: NONE SEEN (ref 0–?)
Total Protein, Urine: NEGATIVE
URINE GLUCOSE: NEGATIVE
UROBILINOGEN UA: 0.2 (ref 0.0–1.0)
pH: 6 (ref 5.0–8.0)

## 2013-07-31 LAB — CBC WITH DIFFERENTIAL/PLATELET
Basophils Absolute: 0 10*3/uL (ref 0.0–0.1)
Basophils Relative: 0.5 % (ref 0.0–3.0)
Eosinophils Absolute: 0.9 10*3/uL — ABNORMAL HIGH (ref 0.0–0.7)
Eosinophils Relative: 17 % — ABNORMAL HIGH (ref 0.0–5.0)
HEMATOCRIT: 40.2 % (ref 36.0–46.0)
HEMOGLOBIN: 13.6 g/dL (ref 12.0–15.0)
LYMPHS ABS: 1.8 10*3/uL (ref 0.7–4.0)
Lymphocytes Relative: 31.9 % (ref 12.0–46.0)
MCHC: 33.8 g/dL (ref 30.0–36.0)
MCV: 99 fl (ref 78.0–100.0)
MONO ABS: 0.4 10*3/uL (ref 0.1–1.0)
MONOS PCT: 7.8 % (ref 3.0–12.0)
NEUTROS ABS: 2.4 10*3/uL (ref 1.4–7.7)
Neutrophils Relative %: 42.8 % — ABNORMAL LOW (ref 43.0–77.0)
PLATELETS: 316 10*3/uL (ref 150.0–400.0)
RBC: 4.06 Mil/uL (ref 3.87–5.11)
RDW: 12 % (ref 11.5–15.5)
WBC: 5.6 10*3/uL (ref 4.0–10.5)

## 2013-07-31 LAB — LIPID PANEL
CHOLESTEROL: 167 mg/dL (ref 0–200)
HDL: 96.3 mg/dL (ref >39.00)
LDL CALC: 62 mg/dL (ref 0–99)
NonHDL: 70.7
Total CHOL/HDL Ratio: 2
Triglycerides: 46 mg/dL (ref 0.0–149.0)
VLDL: 9.2 mg/dL (ref 0.0–40.0)

## 2013-07-31 LAB — HEPATIC FUNCTION PANEL
ALBUMIN: 4.3 g/dL (ref 3.5–5.2)
ALK PHOS: 50 U/L (ref 39–117)
ALT: 20 U/L (ref 0–35)
AST: 29 U/L (ref 0–37)
BILIRUBIN TOTAL: 0.6 mg/dL (ref 0.2–1.2)
Bilirubin, Direct: 0.2 mg/dL (ref 0.0–0.3)
Total Protein: 6.7 g/dL (ref 6.0–8.3)

## 2013-07-31 LAB — BASIC METABOLIC PANEL
BUN: 9 mg/dL (ref 6–23)
CALCIUM: 9.1 mg/dL (ref 8.4–10.5)
CO2: 27 mEq/L (ref 19–32)
Chloride: 103 mEq/L (ref 96–112)
Creatinine, Ser: 0.6 mg/dL (ref 0.4–1.2)
GFR: 106.06 mL/min (ref >60.00)
Glucose, Bld: 85 mg/dL (ref 70–99)
Potassium: 4.4 mEq/L (ref 3.5–5.1)
SODIUM: 137 meq/L (ref 135–145)

## 2013-07-31 LAB — TSH: TSH: 1.32 u[IU]/mL (ref 0.35–4.50)

## 2013-07-31 NOTE — Progress Notes (Signed)
Subjective:    Patient ID: Tamara [name], female    DOB: 1960/05/07, 53 y.o.   MRN: [id number]  HPI  Here for wellness and f/u;  Overall doing ok;  Pt denies CP, worsening SOB, DOE, wheezing, orthopnea, PND, worsening LE edema, palpitations, dizziness or syncope.  Pt denies neurological change such as new headache, facial or extremity weakness.  Pt denies polydipsia, polyuria, or low sugar symptoms. Pt states overall good compliance with treatment and medications, good tolerability, and has been trying to follow lower cholesterol diet.  Pt denies worsening depressive symptoms, suicidal ideation or panic. No fever, night sweats, wt loss, loss of appetite, or other constitutional symptoms.  Pt states good ability with ADL's, has low fall risk, home safety reviewed and adequate, no other significant changes in hearing or vision, and only occasionally active with exercise. S/p lumbar surgury, has twinges of pain recurring recently, moinor to pt. Also improved shoulder pain since seeingDr Tamala Julian, s/p nsaid, a nd therapy.  Going to [place] soon, asks for travel recommendations.  Mentions vaccinated for rabies years ago after exposure to cat with rabies, wants lab to prove for employment purpose, but not sure of cost. Past Medical History  Diagnosis Date  . Depression   . Hypertension   . Allergic rhinitis, mild    No past surgical history on file.  reports that she has never smoked. She does not have any smokeless tobacco history on file. Her alcohol and drug histories are not on file. family history is not on file. No Known Allergies Current Outpatient Prescriptions on File Prior to Visit  Medication Sig Dispense Refill  . ALPRAZolam (XANAX) 0.5 MG tablet       . buPROPion (WELLBUTRIN SR) 150 MG 12 hr tablet Take 150 mg by mouth 2 (two) times daily.        Marland Kitchen FLUoxetine (PROZAC) 20 MG capsule Take 20 mg by mouth daily.        . fluticasone (FLONASE) 50 MCG/ACT nasal spray Place 2 sprays  into the nose daily.  16 g  9  . valsartan (DIOVAN) 320 MG tablet take 1 tablet by mouth once daily  90 tablet  3   No current facility-administered medications on file prior to visit.   Review of Systems Constitutional: Negative for increased diaphoresis, other activity, appetite or other siginficant weight change  HENT: Negative for worsening hearing loss, ear pain, facial swelling, mouth sores and neck stiffness.   Eyes: Negative for other worsening pain, redness or visual disturbance.  Respiratory: Negative for shortness of breath and wheezing.   Cardiovascular: Negative for chest pain and palpitations.  Gastrointestinal: Negative for diarrhea, blood in stool, abdominal distention or other pain Genitourinary: Negative for hematuria, flank pain or change in urine volume.  Musculoskeletal: Negative for myalgias or other joint complaints.  Skin: Negative for color change and wound.  Neurological: Negative for syncope and numbness. other than noted Hematological: Negative for adenopathy. or other swelling Psychiatric/Behavioral: Negative for hallucinations, self-injury, decreased concentration or other worsening agitation.      Objective:   Physical Exam BP 120/80  Pulse 82  Temp(Src) 98.1 F (36.7 C) (Oral)  Ht 5' 1.5" (1.562 m)  Wt 139 lb 4 oz (63.163 kg)  BMI 25.89 kg/m2  SpO2 97% VS noted,  Constitutional: Pt is oriented to person, place, and time. Appears well-developed and well-nourished.  Head: Normocephalic and atraumatic.  Right Ear: External ear normal.  Left Ear: External ear normal.  Nose: Nose  normal.  Mouth/Throat: Oropharynx is clear and moist.  Eyes: Conjunctivae and EOM are normal. Pupils are equal, round, and reactive to light.  Neck: Normal range of motion. Neck supple. No JVD present. No tracheal deviation present.  Cardiovascular: Normal rate, regular rhythm, normal heart sounds and intact distal pulses.   Pulmonary/Chest: Effort normal and breath sounds  without rales or wheezing  Abdominal: Soft. Bowel sounds are normal. NT. No HSM  Musculoskeletal: Normal range of motion. Exhibits no edema.  Lymphadenopathy:  Has no cervical adenopathy.  Neurological: Pt is alert and oriented to person, place, and time. Pt has normal reflexes. No cranial nerve deficit. Motor grossly intact Skin: Skin is warm and dry. No rash noted.  Psychiatric:  Has normal mood and affect. Behavior is normal.  Spine: nontender    Assessment & Plan:

## 2013-07-31 NOTE — Patient Instructions (Addendum)
Please continue all other medications as before, and refills have been done if requested.  Please have the pharmacy call with any other refills you may need.  Please continue your efforts at being more active, low cholesterol diet, and weight control.  You are otherwise up to date with prevention measures today.  Please keep your appointments with your specialists as you may have planned  You will be contacted regarding the referral for: colonoscopy  Please go to the LAB in the Basement (turn left off the elevator) for the tests to be done today  You will be contacted by phone if any changes need to be made immediately.  Otherwise, you will receive a letter about your results with an explanation, but please check with MyChart first.  Please remember to sign up for MyChart if you have not done so, as this will be important to you in the future with finding out test results, communicating by private email, and scheduling acute appointments online when needed.  Please return in 1 year for your yearly visit, or sooner if needed 

## 2013-07-31 NOTE — Progress Notes (Signed)
Pre visit review using our clinic review tool, if applicable. No additional management support is needed unless otherwise documented below in the visit note. 

## 2013-08-01 NOTE — Assessment & Plan Note (Signed)

## 2013-08-12 ENCOUNTER — Encounter: Payer: Self-pay | Admitting: Family Medicine

## 2013-08-12 ENCOUNTER — Ambulatory Visit (INDEPENDENT_AMBULATORY_CARE_PROVIDER_SITE_OTHER): Payer: Managed Care, Other (non HMO) | Admitting: Family Medicine

## 2013-08-12 VITALS — BP 112/80 | HR 85 | Ht 61.5 in | Wt 138.0 lb

## 2013-08-12 DIAGNOSIS — M549 Dorsalgia, unspecified: Secondary | ICD-10-CM

## 2013-08-12 DIAGNOSIS — M999 Biomechanical lesion, unspecified: Secondary | ICD-10-CM

## 2013-08-12 DIAGNOSIS — M9981 Other biomechanical lesions of cervical region: Secondary | ICD-10-CM

## 2013-08-12 MED ORDER — CYCLOBENZAPRINE HCL 5 MG PO TABS: 5.0000 mg | ORAL_TABLET | Freq: Three times a day (TID) | ORAL | Status: DC | PRN

## 2013-08-12 NOTE — Progress Notes (Signed)
  [name] [address] [place], [place] [address] Phone: [phone] Subjective:    CC: bilateral shoulder pain follow up, OMT  [id number] Tamara [name] is a 53 y.o. female coming in with complaint of bilateral shoulder pain. Patient was seen previously and was given injections in both shoulders for shoulder bursitis. Patient was given home exercise program and anti-inflammatories. Patient also responded well to osteopathic manipulation. Patient had been doing very well but is now taking care of a sick dog all the time which has made very difficult. Patient states that she's been having more soreness in her neck as well as upper back. Patient is also had a lot of different life changing events recently with her husband having to find a new job and they will be traveling for her 52th birthday. Patient thinks the anxiety is also playing a roll. Denies any radiation down her arms any numbness or tingling.     Past medical history, social, surgical and family history all reviewed in electronic medical record.   Review of Systems: No headache, visual changes, nausea, vomiting, diarrhea, constipation, dizziness, abdominal pain, skin rash, fevers, chills, night sweats, weight loss, swollen lymph nodes, body aches, joint swelling, muscle aches, chest pain, shortness of breath, mood changes.   Objective Blood pressure 112/80, pulse 85, height 5' 1.5" (1.562 m), weight 138 lb (62.596 kg), SpO2 97.00%.  General: No apparent distress alert and oriented x3 mood and affect normal, dressed appropriately.  HEENT: Pupils equal, extraocular movements intact  Respiratory: Patient's speak in full sentences and does not appear short of breath  Cardiovascular: No lower extremity edema, non tender, no erythema  Skin: Warm dry intact with no signs of infection or rash on extremities or on axial skeleton.  Abdomen: Soft nontender  Neuro: Cranial nerves II through XII are  intact, neurovascularly intact in all extremities with 2+ DTRs and 2+ pulses.  Lymph: No lymphadenopathy of posterior or anterior cervical chain or axillae bilaterally.  Gait normal with good balance and coordination.  MSK:  Non tender with full range of motion and good stability and symmetric strength and tone of shoulders, elbows, wrist, hip, knee and ankles bilaterally.  Back examination patient has some mild levoscoliosis of the thoracic spine    Osteopathic findings C2 flexed rotated and side bent left C5 flexed rotated and side bent right  Thoracic T4 extended rotated and side bent right T6 extended rotated and side bent left T9 extended rotated and side bent right  Lumbar L2 flexed rotated and side bent right   Impression and Recommendations:     This case required medical decision making of moderate complexity.

## 2013-08-12 NOTE — Assessment & Plan Note (Signed)
Patient's upper back pain is still responding well to conservative therapy. Patient will continue with the osteopathic manipulation was given home exercise program. Encourage him to do the exercise program a regular basis. Patient was given a new handout she can do while she is on vacation. The patient does well she can follow up again in 4-6 weeks for further evaluation and treatment.

## 2013-08-12 NOTE — Assessment & Plan Note (Signed)
Decision today to treat with OMT was based on Physical Exam  After verbal consent patient was treated with HVLA and muscle energy techniques in cervical and thoracic areas  Patient tolerated the procedure well with improvement in symptoms  Patient given exercises, stretches and lifestyle modifications  See medications in patient instructions if given  Patient will follow up in 6 weeks

## 2013-08-12 NOTE — Patient Instructions (Signed)
Good to see you Ice still at end of a long day.  Meloxicam when you need it and try the flexeril but may make you sleepy Continue the exercises at least 3 times a week.  Try to avoid a bag if you can help.  Come back in 6 weeks.

## 2013-08-13 ENCOUNTER — Other Ambulatory Visit: Payer: Self-pay

## 2013-08-13 ENCOUNTER — Telehealth: Payer: Self-pay

## 2013-08-13 MED ORDER — BUDESONIDE 32 MCG/ACT NA SUSP: 2.0000 | Freq: Every day | NASAL | Status: DC

## 2013-08-13 MED ORDER — FLUTICASONE PROPIONATE 50 MCG/ACT NA SUSP: 2.0000 | Freq: Every day | NASAL | Status: DC

## 2013-08-13 NOTE — Telephone Encounter (Signed)
Ok, changed to rhinocort aq

## 2013-08-13 NOTE — Telephone Encounter (Signed)
Patients insurance no longer covers the corticosteroid nasal sprays that have gone OTC.  She could purchase OTC, but too expensive.  Alternative insurance will cover budesonide nasal spray.  Advise on changing.

## 2013-08-14 ENCOUNTER — Ambulatory Visit: Payer: Managed Care, Other (non HMO) | Admitting: Internal Medicine

## 2013-08-28 ENCOUNTER — Other Ambulatory Visit: Payer: Self-pay | Admitting: Obstetrics and Gynecology

## 2013-09-05 ENCOUNTER — Encounter: Payer: Self-pay | Admitting: Internal Medicine

## 2013-09-11 ENCOUNTER — Ambulatory Visit
Admission: RE | Admit: 2013-09-11 | Discharge: 2013-09-11 | Disposition: A | Discharge status: Home / Self Care | Payer: Managed Care, Other (non HMO) | Source: Ambulatory Visit

## 2013-09-11 DIAGNOSIS — Z1231 Encounter for screening mammogram for malignant neoplasm of breast: Secondary | ICD-10-CM

## 2013-09-25 ENCOUNTER — Ambulatory Visit (INDEPENDENT_AMBULATORY_CARE_PROVIDER_SITE_OTHER): Payer: Managed Care, Other (non HMO) | Admitting: Family Medicine

## 2013-09-25 ENCOUNTER — Encounter: Payer: Self-pay | Admitting: Family Medicine

## 2013-09-25 VITALS — BP 110/77 | HR 83 | Ht 62.0 in | Wt 139.0 lb

## 2013-09-25 DIAGNOSIS — M549 Dorsalgia, unspecified: Secondary | ICD-10-CM

## 2013-09-25 DIAGNOSIS — M999 Biomechanical lesion, unspecified: Secondary | ICD-10-CM

## 2013-09-25 DIAGNOSIS — M9981 Other biomechanical lesions of cervical region: Secondary | ICD-10-CM

## 2013-09-25 NOTE — Patient Instructions (Signed)
Good to see you.   ice is still your friend.  Continue the exercises and try to add the Posture on wall with heels, butt, shoulders and head touching for goal of 5 minutes Other one that is important is Y-T-A exercises.  Meloxicam daily for 10 days Flexeril at night Come back in 4 weeks.

## 2013-09-25 NOTE — Assessment & Plan Note (Signed)
Decision today to treat with OMT was based on Physical Exam  After verbal consent patient was treated with HVLA and muscle energy techniques in cervical and thoracic areas  Patient tolerated the procedure well with improvement in symptoms  Patient given exercises, stretches and lifestyle modifications  See medications in patient instructions if given  Patient will follow up in 4 weeks due to patient have an exacerbation with increasing her duration.

## 2013-09-25 NOTE — Progress Notes (Signed)
  [name] [address] [place], [place] [address] Phone: [phone] Subjective:    CC: bilateral shoulder pain follow up, OMT  [id number] Tamara [name] is a 53 y.o. female coming in with complaint of bilateral shoulder pain. Patient was seen previously and was given injections in both shoulders for shoulder bursitis. Patient was given home exercise program and anti-inflammatories. Patient also responded well to osteopathic manipulation. Patient at last visit was having significant amount of anxiety with multiple different life stressors. Patient did travel to [place] and was doing well. Patient will has been working an extensive amount of time and has no is more increasing pain in the shoulders bilaterally as well as the neck. Patient has not been doing exercises regularly and has not been taking any anti-inflammatories. Overall patient continues to have some difficulty. Patient actually states she may have been worse since last visit.     Past medical history, social, surgical and family history all reviewed in electronic medical record.   Review of Systems: No headache, visual changes, nausea, vomiting, diarrhea, constipation, dizziness, abdominal pain, skin rash, fevers, chills, night sweats, weight loss, swollen lymph nodes, body aches, joint swelling, muscle aches, chest pain, shortness of breath, mood changes.   Objective Blood pressure 110/77, pulse 83, height 5\' 2"  (1.575 m), weight 139 lb (63.05 kg).  General: No apparent distress alert and oriented x3 mood and affect normal, dressed appropriately.  HEENT: Pupils equal, extraocular movements intact  Respiratory: Patient's speak in full sentences and does not appear short of breath  Cardiovascular: No lower extremity edema, non tender, no erythema  Skin: Warm dry intact with no signs of infection or rash on extremities or on axial skeleton.  Abdomen: Soft nontender  Neuro: Cranial nerves  II through XII are intact, neurovascularly intact in all extremities with 2+ DTRs and 2+ pulses.  Lymph: No lymphadenopathy of posterior or anterior cervical chain or axillae bilaterally.  Gait normal with good balance and coordination.  MSK:  Non tender with full range of motion and good stability and symmetric strength and tone of shoulders, elbows, wrist, hip, knee and ankles bilaterally.  Back examination patient has some mild levoscoliosis of the thoracic spine, no change    Osteopathic findings C2 flexed rotated and side bent left C5 flexed rotated and side bent right  Thoracic T4 extended rotated and side bent right T6 extended rotated and side bent left T9 extended rotated and side bent right  Lumbar L2 flexed rotated and side bent right   Impression and Recommendations:     This case required medical decision making of moderate complexity.

## 2013-09-25 NOTE — Assessment & Plan Note (Signed)
Patient has discontinued doing the exercises on a regular basis as well as for postural exercises that I think was be more beneficial and taking the pressure off of her neck secondary to muscle imbalances. Encourage her to start this again on a regular basis. We discussed over the counter medications to be beneficial as well. Patient showed proper technique of certain exercises that will be beneficial. I do feel that 6 weeks is too long and patient will need to come back a more regular basis and will come back in 4 weeks for further evaluation and treatment.   Spent greater than 25 minutes with patient face-to-face and had greater than 50% of counseling including as described above in assessment and plan.

## 2013-10-01 ENCOUNTER — Ambulatory Visit: Payer: Managed Care, Other (non HMO) | Admitting: Internal Medicine

## 2013-10-22 ENCOUNTER — Ambulatory Visit (INDEPENDENT_AMBULATORY_CARE_PROVIDER_SITE_OTHER): Payer: Managed Care, Other (non HMO) | Admitting: Family Medicine

## 2013-10-22 ENCOUNTER — Encounter: Payer: Self-pay | Admitting: Family Medicine

## 2013-10-22 VITALS — BP 112/80 | HR 88 | Ht 62.0 in | Wt 140.0 lb

## 2013-10-22 DIAGNOSIS — M999 Biomechanical lesion, unspecified: Secondary | ICD-10-CM

## 2013-10-22 DIAGNOSIS — M719 Bursopathy, unspecified: Secondary | ICD-10-CM

## 2013-10-22 DIAGNOSIS — M9981 Other biomechanical lesions of cervical region: Secondary | ICD-10-CM

## 2013-10-22 DIAGNOSIS — M549 Dorsalgia, unspecified: Secondary | ICD-10-CM

## 2013-10-22 DIAGNOSIS — M67919 Unspecified disorder of synovium and tendon, unspecified shoulder: Secondary | ICD-10-CM

## 2013-10-22 DIAGNOSIS — M7550 Bursitis of unspecified shoulder: Secondary | ICD-10-CM

## 2013-10-22 MED ORDER — DICLOFENAC SODIUM 2 % TD SOLN: TRANSDERMAL | Status: DC

## 2013-10-22 NOTE — Assessment & Plan Note (Signed)
Decision today to treat with OMT was based on Physical Exam  After verbal consent patient was treated with HVLA and muscle energy techniques in cervical and thoracic areas  Patient tolerated the procedure well with improvement in symptoms  Patient given exercises, stretches and lifestyle modifications  See medications in patient instructions if given  Patient will follow up in 2-3 weeks

## 2013-10-22 NOTE — Assessment & Plan Note (Signed)
Patient did have injection previously and did have good response. Mrs. been greater than 6 months. If the patient continues to have pain we'll consider doing injections at followup.

## 2013-10-22 NOTE — Patient Instructions (Signed)
You are doing great.  Meloxicam daily for 3 days.  Then start the pennsaid twice daily, this will be sent to your house.  Ice is your friend.  We can consider a shoulder injection if not perfect.  Continue the exercises 3 times a week.  I would say 2-3 weeks.

## 2013-10-22 NOTE — Assessment & Plan Note (Signed)
Patient is responding still well to osteopathic manipulation. Patient is doing well with the over-the-counter medications. Discuss with patient to do more of a scheduled anti-inflammatory for the next 3 days and then we have switched her into a topical medication. Discussed icing as well. Patient will come back and see me again in 2-3 weeks for further evaluation and treatment. No signs of neurologic compression.  Spent greater than 25 minutes with patient face-to-face and had greater than 50% of counseling including as described above in assessment and plan.
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
## 2013-11-28 ENCOUNTER — Ambulatory Visit (INDEPENDENT_AMBULATORY_CARE_PROVIDER_SITE_OTHER)
Admission: RE | Admit: 2013-11-28 | Discharge: 2013-11-28 | Disposition: A | Discharge status: Home / Self Care | Payer: Managed Care, Other (non HMO) | Source: Ambulatory Visit | Attending: Family Medicine | Admitting: Family Medicine

## 2013-11-28 ENCOUNTER — Encounter: Payer: Self-pay | Admitting: Family Medicine

## 2013-11-28 ENCOUNTER — Ambulatory Visit (INDEPENDENT_AMBULATORY_CARE_PROVIDER_SITE_OTHER): Payer: Managed Care, Other (non HMO) | Admitting: Family Medicine

## 2013-11-28 VITALS — BP 125/78 | HR 102 | Ht 61.5 in | Wt 135.0 lb

## 2013-11-28 DIAGNOSIS — M25511 Pain in right shoulder: Secondary | ICD-10-CM

## 2013-11-28 DIAGNOSIS — M999 Biomechanical lesion, unspecified: Secondary | ICD-10-CM

## 2013-11-28 DIAGNOSIS — M25512 Pain in left shoulder: Secondary | ICD-10-CM

## 2013-11-28 DIAGNOSIS — M9902 Segmental and somatic dysfunction of thoracic region: Secondary | ICD-10-CM

## 2013-11-28 DIAGNOSIS — M9901 Segmental and somatic dysfunction of cervical region: Secondary | ICD-10-CM

## 2013-11-28 DIAGNOSIS — M7550 Bursitis of unspecified shoulder: Secondary | ICD-10-CM

## 2013-11-28 DIAGNOSIS — M9903 Segmental and somatic dysfunction of lumbar region: Secondary | ICD-10-CM

## 2013-11-28 DIAGNOSIS — M542 Cervicalgia: Secondary | ICD-10-CM

## 2013-11-28 DIAGNOSIS — M549 Dorsalgia, unspecified: Secondary | ICD-10-CM

## 2013-11-28 DIAGNOSIS — M546 Pain in thoracic spine: Secondary | ICD-10-CM

## 2013-11-28 NOTE — Patient Instructions (Signed)
Good to see you Tamara [name] is still your friend Xrays of neck today. No news is good news.  Flerxeril at night when needed Start turmeric 500mg  twice daily.  See me again in 2 weeks.

## 2013-11-28 NOTE — Progress Notes (Signed)
[name] [place], [place] [employer] [address] Phone: [phone] Subjective:    CC: Bilateral shoulder and neck pain  Tamara [name] REMELL GIAIMO is a 53 y.o. female coming in with complaint of bilateral shoulder pain. Patient is also complaining of significant neck pain. Patient does not complain of neck pain previously. Patient states that unfortunately both of her shoulders is giving her more pain. Patient states that last time she had this much pain she did respond very well to injections in the shoulders bilaterally. Patient states that this pain is keeping her up at night. Patient states that it is making it difficult to do some of her activities. Patient has tried some over-the-counter medications without any significant improvement. Patient states that this is affecting her daily activities and would like further evaluation and potentially the injections.     Past medical history, social, surgical and family history all reviewed in electronic medical record.   Review of Systems: No headache, visual changes, nausea, vomiting, diarrhea, constipation, dizziness, abdominal pain, skin rash, fevers, chills, night sweats, weight loss, swollen lymph nodes, body aches, joint swelling, muscle aches, chest pain, shortness of breath, mood changes.   Objective Blood pressure 125/78, pulse 102, height 5' 1.5" (1.562 m), weight 135 lb (61.236 kg), last menstrual period 11/28/2013, SpO2 99.00%.  General: No apparent distress alert and oriented x3 mood and affect normal, dressed appropriately.  HEENT: Pupils equal, extraocular movements intact  Respiratory: Patient's speak in full sentences and does not appear short of breath  Cardiovascular: No lower extremity edema, non tender, no erythema  Skin: Warm dry intact with no signs of infection or rash on extremities or on axial skeleton.  Abdomen: Soft nontender  Neuro: Cranial nerves II through XII are intact,  neurovascularly intact in all extremities with 2+ DTRs and 2+ pulses.  Lymph: No lymphadenopathy of posterior or anterior cervical chain or axillae bilaterally.  Gait normal with good balance and coordination.  MSK:  Non tender with full range of motion and good stability and symmetric strength and tone of shoulders, elbows, wrist, hip, knee and ankles bilaterally.  Back examination patient has some mild levoscoliosis of the thoracic spine, no change Shoulder: Right Inspection reveals no abnormalities, atrophy or asymmetry. Palpation is normal with no tenderness over AC joint or bicipital groove. ROM is full in all planes. Rotator cuff strength normal throughout. Increase signs of impingement bilaterally No labral pathology noted with negative Obrien's, negative clunk and good stability. Normal scapular function observed. No painful arc and no drop arm sign. No apprehension sign Contralateral shoulder unremarkable  After informed written and verbal consent, patient was seated on exam table. Left shoulder was prepped with alcohol swab and utilizing posterior approach, patient's right glenohumeral space was injected with 4:1  marcaine 0.5%: Kenalog 40mg /dL. Patient tolerated the procedure well without immediate complications.  After informed written and verbal consent, patient was seated on exam table. Right shoulder was prepped with alcohol swab and utilizing posterior approach, patient's right glenohumeral space was injected with 4:1  marcaine 0.5%: Kenalog 40mg /dL. Patient tolerated the procedure well without immediate complications.     Osteopathic findings C2 flexed rotated and side bent left C5 flexed rotated and side bent right  Thoracic T4 extended rotated and side bent right T6 extended rotated and side bent left T7 extended rotated and side bent right  Lumbar L2 flexed rotated and side bent right   Impression and Recommendations:     This case  required medical decision  making of moderate complexity.

## 2013-11-28 NOTE — Assessment & Plan Note (Signed)
Bilateral injections were given again today and patient did notice some moderate improvement immediately. We discussed avoiding any significant overhead activity or any significant lifting at this time. We discussed patient we'll give her a note for work but she declined. I discussed proper sleeping position that can be helpful as well. Patient will start home exercises again in the next 24 hours. Patient then come back and see me in 2 weeks to make sure she continues to improve. Depending on findings on imaging the patient's cervical spine further advanced imaging may be necessary.

## 2013-11-28 NOTE — Assessment & Plan Note (Signed)
Decision today to treat with OMT was based on Physical Exam  After verbal consent patient was treated with HVLA and muscle energy techniques in cervical and thoracic and lumbar. areas  Patient tolerated the procedure well with improvement in symptoms  Patient given exercises, stretches and lifestyle modifications  See medications in patient instructions if given  Patient will follow up in 2-3 weeks

## 2013-11-28 NOTE — Assessment & Plan Note (Signed)
Patient has complained of upper back pain before but does that time and seems to respond well to osteopathic manipulation. Patient having bilateral shoulder pain I do think that cervical neck x-rays would be beneficial to rule out any other central pathology. Patient given exercises otherwise we talked about different postural changes she can make a work that might be helpful. We discussed the icing and patient was given medications per orders. Patient will follow up with me again in 2 weeks for further evaluation and treatment.

## 2013-12-17 ENCOUNTER — Encounter: Payer: Self-pay | Admitting: Family Medicine

## 2013-12-17 ENCOUNTER — Ambulatory Visit (INDEPENDENT_AMBULATORY_CARE_PROVIDER_SITE_OTHER): Payer: Managed Care, Other (non HMO) | Admitting: Family Medicine

## 2013-12-17 ENCOUNTER — Ambulatory Visit: Payer: Managed Care, Other (non HMO) | Admitting: Internal Medicine

## 2013-12-17 VITALS — BP 140/80 | HR 98 | Ht 61.5 in | Wt 141.0 lb

## 2013-12-17 DIAGNOSIS — M9901 Segmental and somatic dysfunction of cervical region: Secondary | ICD-10-CM

## 2013-12-17 DIAGNOSIS — M25511 Pain in right shoulder: Secondary | ICD-10-CM

## 2013-12-17 DIAGNOSIS — M999 Biomechanical lesion, unspecified: Secondary | ICD-10-CM

## 2013-12-17 DIAGNOSIS — M9902 Segmental and somatic dysfunction of thoracic region: Secondary | ICD-10-CM

## 2013-12-17 DIAGNOSIS — M9903 Segmental and somatic dysfunction of lumbar region: Secondary | ICD-10-CM

## 2013-12-17 DIAGNOSIS — M4722 Other spondylosis with radiculopathy, cervical region: Secondary | ICD-10-CM

## 2013-12-17 DIAGNOSIS — M25512 Pain in left shoulder: Secondary | ICD-10-CM

## 2013-12-17 MED ORDER — GABAPENTIN 100 MG PO CAPS: 200.0000 mg | ORAL_CAPSULE | Freq: Every day | ORAL | Status: DC

## 2013-12-17 NOTE — Patient Instructions (Addendum)
So your neck is arthritis.  Gabapentin 100mg  nightly for 1 week then 200mg  nightly thereafter.  Ice is your friend.  Meloxicam daily as needed.  Increase vitamin  D 4000 IU daily Glucosamine 1500mg  daily.  Continue the exercises.  See me again in 4 weeks.

## 2013-12-17 NOTE — Assessment & Plan Note (Signed)
Decision today to treat with OMT was based on Physical Exam  After verbal consent patient was treated with FPR and muscle energy techniques in cervical and thoracic and lumbar. areas  Patient tolerated the procedure well with improvement in symptoms  Patient given exercises, stretches and lifestyle modifications  See medications in patient instructions if given  Patient will follow up in 4 weeks                   

## 2013-12-17 NOTE — Progress Notes (Signed)
[name] [address] [place], [place] [address] Phone: [phone] Subjective:    CC: Bilateral shoulder and neck pain follow up  ASN:[id number] Tamara [name] is a 53 y.o. female coming in with complaint of bilateral shoulder pain. Patient is also complaining of significant neck pain. Patient did have injections in the shoulders bilaterally. Patient tolerated the procedure very well and states that this decrease the pain by approximately 80% bilaterally. Patient states that she is able to do more activity throughout the day and does not notice has much fatigue. Sleeping comfortably at night.   Patient states that she continues to have neck pain. States that as a dull aching sensation that can even wake her up at night. Patient is doing some of the medications intermittently. Patient states that the topical medicine did not help. Patient has been doing icing and home exercises fairly regularly. Patient has done formal physical therapy for it previously. Continues to current regimen.   Patient had x-rays of her neck that does show diffuse degenerative changes at multiple levels.        Past medical history, social, surgical and family history all reviewed in electronic medical record.   Review of Systems: No headache, visual changes, nausea, vomiting, diarrhea, constipation, dizziness, abdominal pain, skin rash, fevers, chills, night sweats, weight loss, swollen lymph nodes, body aches, joint swelling, muscle aches, chest pain, shortness of breath, mood changes.   Objective Blood pressure 140/80, pulse 98, height 5' 1.5" (1.562 m), weight 141 lb (63.957 kg), last menstrual period 11/28/2013, SpO2 98 %.  General: No apparent distress alert and oriented x3 mood and affect normal, dressed appropriately.  HEENT: Pupils equal, extraocular movements intact  Respiratory: Patient's speak in full sentences and does not appear short of breath  Cardiovascular:  No lower extremity edema, non tender, no erythema  Skin: Warm dry intact with no signs of infection or rash on extremities or on axial skeleton.  Abdomen: Soft nontender  Neuro: Cranial nerves II through XII are intact, neurovascularly intact in all extremities with 2+ DTRs and 2+ pulses.  Lymph: No lymphadenopathy of posterior or anterior cervical chain or axillae bilaterally.  Gait normal with good balance and coordination.  MSK:  Non tender with full range of motion and good stability and symmetric strength and tone of shoulders, elbows, wrist, hip, knee and ankles bilaterally.  Back examination patient has some mild levoscoliosis of the thoracic spine, no change Neck: Inspection unremarkable. No palpable stepoffs. positiveSpurling's maneuver. Lacking the last 5 in all range of motion. Grip strength and sensation normal in bilateral hands Strength good C4 to T1 distribution No sensory change to C4 to T1 Negative Hoffman sign bilaterally Reflexes normal Shoulder: Right Inspection reveals no abnormalities, atrophy or asymmetry. Palpation is normal with no tenderness over AC joint or bicipital groove. ROM is full in all planes. Rotator cuff strength normal throughout. Mild impingement sign still noted No labral pathology noted with negative Obrien's, negative clunk and good stability. Normal scapular function observed. No painful arc and no drop arm sign. No apprehension sign Contralateral shoulder unremarkable     Osteopathic findings C2 flexed rotated and side bent left C5 flexed rotated and side bent right  Thoracic T4 extended rotated and side bent right T6 extended rotated and side bent left T7 extended rotated and side bent right  Lumbar L2 flexed rotated and side bent right   Impression and Recommendations:     This case required medical decision  making of moderate complexity.

## 2013-12-17 NOTE — Assessment & Plan Note (Signed)
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 I do think that this could be contributing to some of her shoulder pain. We discussed different treatment option at this time and we will increase patient's vitamin D level as well as the gabapentin. Patient continues to respond fairly well to osteopathic manipulation and we will continue but more of the muscle energy type techniques. We also discussed icing regimen. Patient will continue the other medications as we discussed previously. Patient will come back and see me again in 3-4 weeks for further evaluation.

## 2013-12-17 NOTE — Assessment & Plan Note (Signed)
Has made some improvement with the injections. I do think there is some subacromial bursitis but I also think that this is also from the arthritis in her neck and likely some radicular symptoms. We will continue to monitor. Patient was started on gabapentin today.

## 2013-12-25 ENCOUNTER — Ambulatory Visit (INDEPENDENT_AMBULATORY_CARE_PROVIDER_SITE_OTHER): Payer: Managed Care, Other (non HMO) | Admitting: Internal Medicine

## 2013-12-25 ENCOUNTER — Encounter: Payer: Self-pay | Admitting: Internal Medicine

## 2013-12-25 VITALS — BP 132/82 | HR 109 | Temp 98.9°F | Wt 137.0 lb

## 2013-12-25 DIAGNOSIS — J209 Acute bronchitis, unspecified: Secondary | ICD-10-CM

## 2013-12-25 DIAGNOSIS — I10 Essential (primary) hypertension: Secondary | ICD-10-CM

## 2013-12-25 MED ORDER — PREDNISONE 10 MG PO TABS: ORAL_TABLET | ORAL | Status: DC

## 2013-12-25 MED ORDER — LEVOFLOXACIN 250 MG PO TABS: 250.0000 mg | ORAL_TABLET | Freq: Every day | ORAL | Status: DC

## 2013-12-25 MED ORDER — HYDROCODONE-HOMATROPINE 5-1.5 MG/5ML PO SYRP
5.0000 mL | ORAL_SOLUTION | Freq: Four times a day (QID) | ORAL | Status: DC | PRN
Start: 2013-12-25 — End: 2014-03-19

## 2013-12-25 NOTE — Progress Notes (Signed)
Pre visit review using our clinic review tool, if applicable. No additional management support is needed unless otherwise documented below in the visit note. 

## 2013-12-25 NOTE — Progress Notes (Signed)
   Subjective:    Patient ID: Tamara [name], female    DOB: 24-Aug-1960, 53 y.o.   MRN: [id number]  HPI  Here with acute onset mild to mod 2-3 days ST, HA, general weakness and malaise, with prod cough greenish sputum, but Pt denies chest pain, increased sob or doe, wheezing, orthopnea, PND, increased LE swelling, palpitations, dizziness or syncope. Past Medical History  Diagnosis Date  . Depression   . Hypertension   . Allergic rhinitis, mild    No past surgical history on file.  reports that she has never smoked. She does not have any smokeless tobacco history on file. Her alcohol and drug histories are not on file. family history is not on file. No Known Allergies Current Outpatient Prescriptions on File Prior to Visit  Medication Sig Dispense Refill  . ALPRAZolam (XANAX) 0.5 MG tablet     . budesonide (RHINOCORT AQUA) 32 MCG/ACT nasal spray Place 2 sprays into both nostrils daily. 8.6 g 5  . buPROPion (WELLBUTRIN SR) 150 MG 12 hr tablet Take 150 mg by mouth 2 (two) times daily.      . cyclobenzaprine (FLEXERIL) 5 MG tablet Take 1 tablet (5 mg total) by mouth 3 (three) times daily as needed for muscle spasms. 30 tablet 0  . FLUoxetine (PROZAC) 20 MG capsule Take 20 mg by mouth daily.      . fluticasone (FLONASE) 50 MCG/ACT nasal spray Place 2 sprays into both nostrils daily. 48 g 3  . gabapentin (NEURONTIN) 100 MG capsule Take 2 capsules (200 mg total) by mouth at bedtime. 60 capsule 1  . meloxicam (MOBIC) 15 MG tablet Take 15 mg by mouth daily as needed.    . valsartan (DIOVAN) 320 MG tablet take 1 tablet by mouth once daily 90 tablet 3   No current facility-administered medications on file prior to visit.   Review of Systems All otherwise neg per pt     Objective:   Physical Exam BP 132/82 mmHg  Pulse 109  Temp(Src) 98.9 F (37.2 C) (Oral)  Wt 137 lb (62.143 kg)  SpO2 95%  LMP 11/28/2013 VS noted,  Constitutional: Pt appears well-developed, well-nourished.  HENT:  Head: NCAT.  Right Ear: External ear normal.  Left Ear: External ear normal.  Eyes: . Pupils are equal, round, and reactive to light. Conjunctivae and EOM are normal Neck: Normal range of motion. Neck supple.  Cardiovascular: Normal rate and regular rhythm.   Pulmonary/Chest: Effort normal and breath sounds normal. - no rales or wheezing Neurological: Pt is alert. Not confused , motor grossly intact Skin: Skin is warm. No rash Psychiatric: Pt behavior is normal. No agitation.     Assessment & Plan:

## 2013-12-25 NOTE — Patient Instructions (Signed)
Please take all new medication as prescribed  Please continue all other medications as before, and refills have been done if requested.  Please have the pharmacy call with any other refills you may need.  Please keep your appointments with your specialists as you may have planned     

## 2013-12-26 ENCOUNTER — Telehealth: Payer: Self-pay

## 2013-12-26 DIAGNOSIS — J209 Acute bronchitis, unspecified: Secondary | ICD-10-CM | POA: Insufficient documentation

## 2013-12-26 NOTE — Telephone Encounter (Signed)
Letter done, faxed to her office at [phone].  Called the patient informed letter done and faxed.

## 2013-12-26 NOTE — Telephone Encounter (Signed)
-----   Message from [name], MD sent at 12/26/2013  1:00 PM EST ----- [place] with me. thanks ----- Message -----    From: Donell Sievert Eldar Robitaille    Sent: 12/26/2013  12:05 PM      To: [name], MD  Patient was given a work note at appt. Yesterday and would like to have added to be out today and return tomorrow 12/27/13.  If ok I can do and fax to her employer. Let me know

## 2013-12-26 NOTE — Assessment & Plan Note (Signed)
Mild to mod, for antibx course,  to f/u any worsening symptoms or concerns 

## 2013-12-26 NOTE — Assessment & Plan Note (Signed)
stable overall by history and exam, recent data reviewed with pt, and pt to continue medical treatment as before,  to f/u any worsening symptoms or concerns BP Readings from Last 3 Encounters:  12/25/13 132/82  12/17/13 140/80  11/28/13 125/78

## 2014-01-07 ENCOUNTER — Encounter: Payer: Self-pay | Admitting: Family Medicine

## 2014-01-07 ENCOUNTER — Ambulatory Visit (INDEPENDENT_AMBULATORY_CARE_PROVIDER_SITE_OTHER): Payer: Managed Care, Other (non HMO) | Admitting: Family Medicine

## 2014-01-07 VITALS — BP 118/84 | HR 93 | Ht 61.5 in | Wt 138.0 lb

## 2014-01-07 DIAGNOSIS — M4722 Other spondylosis with radiculopathy, cervical region: Secondary | ICD-10-CM

## 2014-01-07 DIAGNOSIS — M9902 Segmental and somatic dysfunction of thoracic region: Secondary | ICD-10-CM

## 2014-01-07 DIAGNOSIS — M999 Biomechanical lesion, unspecified: Secondary | ICD-10-CM

## 2014-01-07 DIAGNOSIS — M9901 Segmental and somatic dysfunction of cervical region: Secondary | ICD-10-CM

## 2014-01-07 DIAGNOSIS — M9903 Segmental and somatic dysfunction of lumbar region: Secondary | ICD-10-CM

## 2014-01-07 NOTE — Progress Notes (Signed)
  [name] [place], [place] [address] Phone: [phone] Subjective:    CC: Bilateral shoulder and neck pain follow up  [id number] Tamara [name] is a 53 y.o. female coming in with complaint of bilateral shoulder pain. Patient is also complaining of significant neck pain. Patient did have injections in the shoulders bilaterally and had improvements. Did have significant OA of neck noted. Patient did have bilateral shoulder injections multiple visits ago and continues to do well after that. Patient is trying conservative therapy.  Patient is on gababpentin 200mg  at night. Patient states that this is help somewhat. Patient is working with significant number of Avoiding a Significant Amount of Heavy Lifting. Patient denies any new symptoms.   Patient had x-rays of her neck that does show diffuse degenerative changes at multiple levels.     Past medical history, social, surgical and family history all reviewed in electronic medical record.   Review of Systems: No headache, visual changes, nausea, vomiting, diarrhea, constipation, dizziness, abdominal pain, skin rash, fevers, chills, night sweats, weight loss, swollen lymph nodes, body aches, joint swelling, muscle aches, chest pain, shortness of breath, mood changes.   Objective Blood pressure 118/84, pulse 93, height 5' 1.5" (1.562 m), weight 138 lb (62.596 kg), SpO2 95 %.  General: No apparent distress alert and oriented x3 mood and affect normal, dressed appropriately.  HEENT: Pupils equal, extraocular movements intact  Respiratory: Patient's speak in full sentences and does not appear short of breath  Cardiovascular: No lower extremity edema, non tender, no erythema  Skin: Warm dry intact with no signs of infection or rash on extremities or on axial skeleton.  Abdomen: Soft nontender  Neuro: Cranial nerves II through XII are intact, neurovascularly intact in all extremities with 2+  DTRs and 2+ pulses.  Lymph: No lymphadenopathy of posterior or anterior cervical chain or axillae bilaterally.  Gait normal with good balance and coordination.  MSK:  Non tender with full range of motion and good stability and symmetric strength and tone of shoulders, elbows, wrist, hip, knee and ankles bilaterally.  Back examination patient has some mild levoscoliosis of the thoracic spine, no change Neck: Inspection unremarkable. No palpable stepoffs. positiveSpurling's maneuver. Lacking the last 5 in all range of motion. Grip strength and sensation normal in bilateral hands Strength good C4 to T1 distribution No sensory change to C4 to T1 Negative Hoffman sign bilaterally Reflexes normal Shoulder: Bilateral Inspection reveals no abnormalities, atrophy or asymmetry. Palpation is normal with no tenderness over AC joint or bicipital groove. ROM is full in all planes. Rotator cuff strength normal throughout. Negative impingement No labral pathology noted with negative Obrien's, negative clunk and good stability. Normal scapular function observed. No painful arc and no drop arm sign. No apprehension sign Mild improvement from previous exam    Osteopathic findings C2 flexed rotated and side bent left C6 flexed rotated and side bent right  Thoracic T4 extended rotated and side bent right T6 extended rotated and side bent left T7 extended rotated and side bent right  Lumbar L2 flexed rotated and side bent right   Impression and Recommendations:     This case required medical decision making of moderate complexity.

## 2014-01-07 NOTE — Assessment & Plan Note (Signed)
Patient I do think does have some underlying radicular symptoms that is responding fairly well to the gabapentin. We discussed the possibility of increasing the gabapentin to 300 mg but if it makes her tired during the day she will go back to 200 mg. We discussed continuing the over-the-counter medications. Patient will continue the home exercises. Patient continues to respond well to osteopathic manipulation. Patient will come back and see me again in 3-4 weeks for further evaluation and treatment.  Spent greater than 25 minutes with patient face-to-face and had greater than 50% of counseling including as described above in assessment and plan.

## 2014-01-07 NOTE — Patient Instructions (Addendum)
Good to see you as always Happy [place] day! OK to divide the vitamin D and continue the vitamins including the 500mg  twice daily.  Increase the gabapentin to 300mg  at night and lets see if it helps Continue all other exercises See me again in 4 weeks.

## 2014-01-07 NOTE — Assessment & Plan Note (Signed)
Decision today to treat with OMT was based on Physical Exam  After verbal consent patient was treated with FPR and muscle energy techniques in cervical and thoracic and lumbar. areas  Patient tolerated the procedure well with improvement in symptoms  Patient given exercises, stretches and lifestyle modifications  See medications in patient instructions if given  Patient will follow up in 4 weeks                   

## 2014-01-08 ENCOUNTER — Ambulatory Visit (INDEPENDENT_AMBULATORY_CARE_PROVIDER_SITE_OTHER): Payer: Managed Care, Other (non HMO) | Admitting: Family Medicine

## 2014-01-08 ENCOUNTER — Telehealth: Payer: Self-pay | Admitting: Family Medicine

## 2014-01-08 ENCOUNTER — Encounter: Payer: Self-pay | Admitting: Family Medicine

## 2014-01-08 VITALS — BP 120/84 | HR 96 | Temp 98.3°F | Ht 61.5 in | Wt 138.0 lb

## 2014-01-08 DIAGNOSIS — T7840XA Allergy, unspecified, initial encounter: Secondary | ICD-10-CM

## 2014-01-08 MED ORDER — METHYLPREDNISOLONE ACETATE 80 MG/ML IJ SUSP
80.0000 mg | Freq: Once | INTRAMUSCULAR | Status: AC
  Administered 2014-01-08: 80 mg via INTRAMUSCULAR

## 2014-01-08 MED ORDER — AMOXICILLIN-POT CLAVULANATE 875-125 MG PO TABS: 1.0000 | ORAL_TABLET | Freq: Two times a day (BID) | ORAL | Status: DC

## 2014-01-08 NOTE — Telephone Encounter (Signed)
Spoke to pt, she is coming in to see Dr. Tamala Julian today.

## 2014-01-08 NOTE — Addendum Note (Signed)
Addended by: Douglass Rivers T on: 01/08/2014 03:00 PM   Modules accepted: Orders

## 2014-01-08 NOTE — Telephone Encounter (Signed)
Pt request phone call from the assistant concern about her neck pain, Please give her a call, offer an appt with Dr. Jenny Reichmann today but pt request to speak to the assistant.

## 2014-01-08 NOTE — Progress Notes (Signed)
SUBJECTIVE:  Tamara [name] is a 53 y.o. female who complains of congestion, sore throat, swollen glands, post nasal drip, pain while swallowing and enlarged tonsils for 2 days. She denies a history of chest pain, fatigue, fevers, nausea, vomiting, weakness and weight loss and denies a history of asthma. Patient denies smoke cigarettes. Does work with a lot of animals, but denies anything new in her environment.   OBJECTIVE: Blood pressure 120/84, pulse 96, temperature 98.3 F (36.8 C), temperature source Oral, height 5' 1.5" (1.562 m), weight 138 lb (62.596 kg), SpO2 94 %.  She appears well, vital signs are as noted. Ears normal.  Throat and pharynx moderate erythema with mild swelling of the tonsils  Neck supple with mild anterior cervical lymphadenopathy.  Nose is congested. Sinuses non tender. The chest is clear, without wheezes or rales. No significant rash appreciated. Mild exfoliation on back from patient itching. Patient does have some mild swelling of her face in general.  ASSESSMENT:  Likely more of an allergic reaction  PLAN: Patient was given a shot of Depo-Medrol and also is going to be started on anti-and histamines. We discussed the possibility of this being bacterial with patient not having a fever and feeling otherwise healthy this is lower likelihood. Patient does have any worsening with breathing she knows to go to the ER immediately. We discussed starting the antibiotic in the next 48 hours if she is not improving with this being the holiday weekend. Patient otherwise will follow-up with PCP in one week.

## 2014-01-08 NOTE — Telephone Encounter (Signed)
Returned pt call. [email or web address].

## 2014-01-08 NOTE — Patient Instructions (Signed)
Good to see you Cut back on the gabapentin and see if that helps Zyrtec daily or nightly for next 2 weeks at least Shot today to help Augmentin 2 times daily if not better in 24-48 hours.  If trouble breathing go to ER immediately.  Happy thanksgiving.

## 2014-01-24 ENCOUNTER — Telehealth: Payer: Self-pay | Admitting: Internal Medicine

## 2014-01-24 MED ORDER — MELOXICAM 15 MG PO TABS: 15.0000 mg | ORAL_TABLET | Freq: Every day | ORAL | Status: DC | PRN

## 2014-01-24 NOTE — Telephone Encounter (Signed)
Done erx 

## 2014-01-24 NOTE — Telephone Encounter (Signed)
Pt requesting refill of Meloxicam.

## 2014-02-04 ENCOUNTER — Ambulatory Visit: Payer: Managed Care, Other (non HMO) | Admitting: Family Medicine

## 2014-02-22 NOTE — Addendum Note (Signed)
Addended by: [name] on: 02/22/2014 09:01 PM   Modules accepted: Miquel Dunn

## 2014-03-19 ENCOUNTER — Encounter: Payer: Self-pay | Admitting: Family Medicine

## 2014-03-19 ENCOUNTER — Ambulatory Visit (INDEPENDENT_AMBULATORY_CARE_PROVIDER_SITE_OTHER): Payer: 59 | Admitting: Family Medicine

## 2014-03-19 VITALS — BP 122/84 | HR 104 | Ht 61.5 in | Wt 142.0 lb

## 2014-03-19 DIAGNOSIS — M9901 Segmental and somatic dysfunction of cervical region: Secondary | ICD-10-CM

## 2014-03-19 DIAGNOSIS — M4722 Other spondylosis with radiculopathy, cervical region: Secondary | ICD-10-CM

## 2014-03-19 DIAGNOSIS — M999 Biomechanical lesion, unspecified: Secondary | ICD-10-CM

## 2014-03-19 DIAGNOSIS — M9903 Segmental and somatic dysfunction of lumbar region: Secondary | ICD-10-CM

## 2014-03-19 DIAGNOSIS — M9902 Segmental and somatic dysfunction of thoracic region: Secondary | ICD-10-CM

## 2014-03-19 NOTE — Assessment & Plan Note (Signed)
Decision today to treat with OMT was based on Physical Exam  After verbal consent patient was treated with FPR and muscle energy techniques in cervical and thoracic and lumbar. areas  Patient tolerated the procedure well with improvement in symptoms  Patient given exercises, stretches and lifestyle modifications  See medications in patient instructions if given  Patient will follow up in 4 weeks

## 2014-03-19 NOTE — Progress Notes (Signed)
Pre visit review using our clinic review tool, if applicable. No additional management support is needed unless otherwise documented below in the visit note. 

## 2014-03-19 NOTE — Progress Notes (Signed)
  [name] [place], [place] [address] Phone: [phone] Subjective:    CC: Bilateral shoulder and neck pain follow up  [id number] Tamara [name] is a 54 y.o. female coming in with complaint of bilateral shoulder pain. Patient is also complaining of significant neck pain. Patient did have injections in the shoulders bilaterally and had improvements. 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 Patient did have bilateral shoulder injections multiple visits ago and continues to do well after that. Patient is trying conservative therapy. Not doing the exercises as much as she should. Patient has not been seen for greater than 2 months. Patient states of increasing neck pain. Patient though has not been as a religious on doing the exercises about vitamins. Patient continues to be only do daily activities. Pain is worse at night. Not taking any pain medication .   Patient had x-rays of her neck that does show diffuse degenerative changes at multiple levels.     Past medical history, social, surgical and family history all reviewed in electronic medical record.   Review of Systems: No headache, visual changes, nausea, vomiting, diarrhea, constipation, dizziness, abdominal pain, skin rash, fevers, chills, night sweats, weight loss, swollen lymph nodes, body aches, joint swelling, muscle aches, chest pain, shortness of breath, mood changes.   Objective Blood pressure 122/84, pulse 104, height 5' 1.5" (1.562 m), weight 142 lb (64.411 kg), SpO2 98 %.  General: No apparent distress alert and oriented x3 mood and affect normal, dressed appropriately.  HEENT: Pupils equal, extraocular movements intact  Respiratory: Patient's speak in full sentences and does not appear short of breath  Cardiovascular: No lower extremity edema, non tender, no erythema  Skin: Warm dry intact with no signs of infection or rash on extremities or on axial skeleton.    Abdomen: Soft nontender  Neuro: Cranial nerves II through XII are intact, neurovascularly intact in all extremities with 2+ DTRs and 2+ pulses.  Lymph: No lymphadenopathy of posterior or anterior cervical chain or axillae bilaterally.  Gait normal with good balance and coordination.  MSK:  Non tender with full range of motion and good stability and symmetric strength and tone of shoulders, elbows, wrist, hip, knee and ankles bilaterally.  Back examination patient has some mild levoscoliosis of the thoracic spine, no change Neck: Inspection unremarkable. No palpable stepoffs. positiveSpurling's maneuver still present Lacking the last 5 in all range of motion. Grip strength and sensation normal in bilateral hands Strength good C4 to T1 distribution No sensory change to C4 to T1 Negative Hoffman sign bilaterally Reflexes normal Shoulder: Bilateral Inspection reveals no abnormalities, atrophy or asymmetry. Palpation is normal with no tenderness over AC joint or bicipital groove. ROM is full in all planes. Rotator cuff strength normal throughout. Negative impingement No labral pathology noted with negative Obrien's, negative clunk and good stability.  Normal scapular function observed. No painful arc and no drop arm sign. No apprehension sign No significant change from previous exam    Osteopathic findings C2 flexed rotated and side bent left C6 flexed rotated and side bent right  Thoracic T4 extended rotated and side bent right T6 extended rotated and side bent left T7 extended rotated and side bent right  Lumbar L2 flexed rotated and side bent right  No significant change from previous exam   Impression and Recommendations:     This case required medical decision making of moderate complexity.

## 2014-03-19 NOTE — Patient Instructions (Signed)
Good to see you Ice is your friend Conitnue the vitamins Work with Leveda Anna today.  See me again in 3 weeks.  When I see you we will consider nortriptyline if needed.

## 2014-03-19 NOTE — Assessment & Plan Note (Signed)
She continues to have some difficulty with the neck I think is contributing to most of her discomfort. We discussed postural changes since home exercises. Patient did respond well to manipulation again. Patient will try to make these different changes and come back and see me again in 3-4 weeks.

## 2014-03-22 ENCOUNTER — Other Ambulatory Visit: Payer: Self-pay | Admitting: Internal Medicine

## 2014-04-09 ENCOUNTER — Ambulatory Visit (INDEPENDENT_AMBULATORY_CARE_PROVIDER_SITE_OTHER): Payer: 59 | Admitting: Family Medicine

## 2014-04-09 ENCOUNTER — Encounter: Payer: Self-pay | Admitting: Family Medicine

## 2014-04-09 VITALS — BP 108/74 | HR 97 | Ht 61.5 in | Wt 141.0 lb

## 2014-04-09 DIAGNOSIS — M999 Biomechanical lesion, unspecified: Secondary | ICD-10-CM

## 2014-04-09 DIAGNOSIS — M4722 Other spondylosis with radiculopathy, cervical region: Secondary | ICD-10-CM

## 2014-04-09 DIAGNOSIS — M9901 Segmental and somatic dysfunction of cervical region: Secondary | ICD-10-CM

## 2014-04-09 DIAGNOSIS — M9903 Segmental and somatic dysfunction of lumbar region: Secondary | ICD-10-CM

## 2014-04-09 DIAGNOSIS — M9902 Segmental and somatic dysfunction of thoracic region: Secondary | ICD-10-CM

## 2014-04-09 NOTE — Progress Notes (Signed)
[name] [place], [place] [address] Phone: [phone] Subjective:    CC: Bilateral shoulder and neck pain follow up  [id number] Tamara [name] is a 54 y.o. female coming in with complaint of bilateral shoulder pain. Patient is also complaining of significant neck pain. Patient did have injections in the shoulders bilaterally and had improvements. 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 Last injections in her shoulders 11/28/2013. Patient is trying conservative therapy. Continues to have difficulty to do the exercises on her own accord. Patient though does not have the time or financial resources to do formal physical therapy. Patient states she has been doing well, patient though has had some increasing stress recently and has not been doing the exercises regularly. Patient states that sometimes she has a back pain and some time she has neck pain but it is still very manageable. Nothing that is stopping her from daily activities.  Patient had x-rays of her neck that does show diffuse degenerative changes at multiple levels.     Past medical history, social, surgical and family history all reviewed in electronic medical record.   Review of Systems: No headache, visual changes, nausea, vomiting, diarrhea, constipation, dizziness, abdominal pain, skin rash, fevers, chills, night sweats, weight loss, swollen lymph nodes, body aches, joint swelling, muscle aches, chest pain, shortness of breath, mood changes.   Objective Blood pressure 108/74, pulse 97, height 5' 1.5" (1.562 m), weight 141 lb (63.957 kg), SpO2 97 %.  General: No apparent distress alert and oriented x3 mood and affect normal, dressed appropriately.  HEENT: Pupils equal, extraocular movements intact  Respiratory: Patient's speak in full sentences and does not appear short of breath  Cardiovascular: No lower extremity edema, non tender, no erythema  Skin: Warm dry  intact with no signs of infection or rash on extremities or on axial skeleton.  Abdomen: Soft nontender  Neuro: Cranial nerves II through XII are intact, neurovascularly intact in all extremities with 2+ DTRs and 2+ pulses.  Lymph: No lymphadenopathy of posterior or anterior cervical chain or axillae bilaterally.  Gait normal with good balance and coordination.  MSK:  Non tender with full range of motion and good stability and symmetric strength and tone of shoulders, elbows, wrist, hip, knee and ankles bilaterally.  Back examination patient has some mild levoscoliosis of the thoracic spine, no change Neck: Inspection unremarkable. No palpable stepoffs. positiveSpurling's maneuver still present Lacking the last 5 in all range of motion. Grip strength and sensation normal in bilateral hands Strength good C4 to T1 distribution No sensory change to C4 to T1 Negative Hoffman sign bilaterally Reflexes normal Shoulder: Bilateral Inspection reveals no abnormalities, atrophy or asymmetry. Palpation is normal with no tenderness over AC joint or bicipital groove. ROM is full in all planes. Rotator cuff strength normal throughout. Negative impingement No labral pathology noted with negative Obrien's, negative clunk and good stability.  Normal scapular function observed. No painful arc and no drop arm sign. No apprehension sign No significant change from previous exam    Osteopathic findings C2 flexed rotated and side bent left C6 flexed rotated and side bent right  Thoracic T4 extended rotated and side bent right T6 extended rotated and side bent left T7 extended rotated and side bent right  Lumbar L2 flexed rotated and side bent right  No significant change from previous exam   Impression and Recommendations:     This case required medical decision making of moderate  complexity.

## 2014-04-09 NOTE — Assessment & Plan Note (Signed)
Decision today to treat with OMT was based on Physical Exam  After verbal consent patient was treated with FPR and muscle energy techniques in cervical and thoracic and lumbar. areas  Patient tolerated the procedure well with improvement in symptoms  Patient given exercises, stretches and lifestyle modifications  See medications in patient instructions if given  Patient will follow up in 4 weeks

## 2014-04-09 NOTE — Assessment & Plan Note (Signed)
Doing well with OMT and conservative therapy.  Discussed icing, HEP and given handout. Patient's we discussed again with more postural control and different postural changes she can do at work. Patient will come back and see me again in 3-4 weeks for further evaluation and treatment.

## 2014-04-09 NOTE — Progress Notes (Signed)
Pre visit review using our clinic review tool, if applicable. No additional management support is needed unless otherwise documented below in the visit note. 

## 2014-04-09 NOTE — Patient Instructions (Addendum)
Good to see you.  Ice is your friend when needed Continue the exercises, these are key.  Good luck with the kitten.  New exercises for your knee See me again in 4 weeks.

## 2014-05-07 ENCOUNTER — Ambulatory Visit: Payer: 59 | Admitting: Family Medicine

## 2014-05-12 ENCOUNTER — Ambulatory Visit: Payer: 59 | Admitting: Family Medicine

## 2014-05-19 ENCOUNTER — Ambulatory Visit: Payer: 59 | Admitting: Family Medicine

## 2014-05-19 DIAGNOSIS — Z0289 Encounter for other administrative examinations: Secondary | ICD-10-CM

## 2014-06-09 ENCOUNTER — Ambulatory Visit (INDEPENDENT_AMBULATORY_CARE_PROVIDER_SITE_OTHER): Payer: 59 | Admitting: Family Medicine

## 2014-06-09 ENCOUNTER — Encounter: Payer: Self-pay | Admitting: Family Medicine

## 2014-06-09 VITALS — BP 122/84 | HR 92 | Ht 61.5 in | Wt 143.0 lb

## 2014-06-09 DIAGNOSIS — F329 Major depressive disorder, single episode, unspecified: Secondary | ICD-10-CM

## 2014-06-09 DIAGNOSIS — M9903 Segmental and somatic dysfunction of lumbar region: Secondary | ICD-10-CM | POA: Diagnosis not present

## 2014-06-09 DIAGNOSIS — M9901 Segmental and somatic dysfunction of cervical region: Secondary | ICD-10-CM

## 2014-06-09 DIAGNOSIS — M999 Biomechanical lesion, unspecified: Secondary | ICD-10-CM | POA: Insufficient documentation

## 2014-06-09 DIAGNOSIS — F32A Depression, unspecified: Secondary | ICD-10-CM

## 2014-06-09 DIAGNOSIS — M9902 Segmental and somatic dysfunction of thoracic region: Secondary | ICD-10-CM

## 2014-06-09 DIAGNOSIS — M4722 Other spondylosis with radiculopathy, cervical region: Secondary | ICD-10-CM | POA: Diagnosis not present

## 2014-06-09 MED ORDER — VENLAFAXINE HCL ER 37.5 MG PO CP24: 37.5000 mg | ORAL_CAPSULE | Freq: Every day | ORAL | Status: DC

## 2014-06-09 NOTE — Assessment & Plan Note (Signed)
Patient's overall seems to be doing relatively well. We discussed emergency plan. Patient denies any recent suicidal or homicidal ideation but is having difficulty finding motivation to do exercises on her regular basis. Patient will switch her anti-depressive medications. We will start her on Effexor which will hopefully help with some of the pain relief that she is having as well. Patient will slowly titrate off patient was given titration pedicle. Patient and will come back and see me again in 2-3 weeks to discuss possible titrating up on the Effexor.

## 2014-06-09 NOTE — Progress Notes (Signed)
Pre visit review using our clinic review tool, if applicable. No additional management support is needed unless otherwise documented below in the visit note. 

## 2014-06-09 NOTE — Patient Instructions (Addendum)
Good to see you.  Continue what you are doing.  Try to be a little more regular with the home exercises.  Prozac and wellbutrin every other day for next weeks and start effexor twice daily on other days.  After a week start effexor daily. Call me if any side effects and consider xanax when needed.  See me again in 2-3 weeks.

## 2014-06-09 NOTE — Assessment & Plan Note (Signed)
Continues to respond fairly well to the conservative therapy. Discussed other over-the-counter medications that I think a be beneficial. We'll have patient actually change some medications here. Patient will come back and see me again in 2-3 weeks for further evaluation and treatment.

## 2014-06-09 NOTE — Assessment & Plan Note (Signed)
Decision today to treat with OMT was based on Physical Exam  After verbal consent patient was treated with FPR and muscle energy techniques in cervical and thoracic and lumbar. areas  Patient tolerated the procedure well with improvement in symptoms  Patient given exercises, stretches and lifestyle modifications  See medications in patient instructions if given  Patient will follow up in 2-3 weeks

## 2014-06-09 NOTE — Progress Notes (Signed)
  [name] [place], [place] [address] Phone: [phone] Subjective:    CC: Bilateral shoulder and neck pain follow up  [id number] Tamara [name] is a 54 y.o. female coming in with complaint of bilateral shoulder pain. Patient is also complaining of significant neck pain. Patient did have injections in the shoulders bilaterally and had improvements. 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 Last injections in her shoulders 11/28/2013. Patient has had some difficulty recently because her as medical history significant for depression. Patient is having some trouble  Patient had x-rays of her neck that does show diffuse degenerative changes at multiple levels.     Past medical history, social, surgical and family history all reviewed in electronic medical record.   Review of Systems: No headache, visual changes, nausea, vomiting, diarrhea, constipation, dizziness, abdominal pain, skin rash, fevers, chills, night sweats, weight loss, swollen lymph nodes, body aches, joint swelling, muscle aches, chest pain, shortness of breath, mood changes.   Objective Blood pressure 122/84, pulse 92, height 5' 1.5" (1.562 m), weight 143 lb (64.864 kg), SpO2 94 %.  General: No apparent distress alert and oriented x3 mood and affect normal, dressed appropriately.  HEENT: Pupils equal, extraocular movements intact  Respiratory: Patient's speak in full sentences and does not appear short of breath  Cardiovascular: No lower extremity edema, non tender, no erythema  Skin: Warm dry intact with no signs of infection or rash on extremities or on axial skeleton.  Abdomen: Soft nontender  Neuro: Cranial nerves II through XII are intact, neurovascularly intact in all extremities with 2+ DTRs and 2+ pulses.  Lymph: No lymphadenopathy of posterior or anterior cervical chain or axillae bilaterally.  Gait normal with good balance and coordination.  MSK:  Non  tender with full range of motion and good stability and symmetric strength and tone of shoulders, elbows, wrist, hip, knee and ankles bilaterally.  Back examination patient has some mild levoscoliosis of the thoracic spine, no change Neck: Inspection unremarkable. No palpable stepoffs. positiveSpurling's maneuver still present Lacking the last 5 in all range of motion.no significant worsening from previous exam Grip strength and sensation normal in bilateral hands Strength good C4 to T1 distribution No sensory change to C4 to T1 Negative Hoffman sign bilaterally Reflexes normal Shoulder: Bilateral Inspection reveals no abnormalities, atrophy or asymmetry. Palpation is normal with no tenderness over AC joint or bicipital groove. ROM is full in all planes. Rotator cuff strength normal throughout. Negative impingement No labral pathology noted with negative Obrien's, negative clunk and good stability.  Normal scapular function observed. No painful arc and no drop arm sign. No apprehension sign No significant change from previous exam    Osteopathic findings C2 flexed rotated and side bent left C6 flexed rotated and side bent right  Thoracic T4 extended rotated and side bent right T6 extended rotated and side bent left T7 extended rotated and side bent right  Lumbar L2 flexed rotated and side bent right  Sacrum left on left   Impression and Recommendations:     This case required medical decision making of moderate complexity.

## 2014-06-20 ENCOUNTER — Ambulatory Visit: Payer: 59 | Admitting: Internal Medicine

## 2014-06-23 ENCOUNTER — Ambulatory Visit: Payer: 59 | Admitting: Internal Medicine

## 2014-06-23 DIAGNOSIS — R4689 Other symptoms and signs involving appearance and behavior: Secondary | ICD-10-CM | POA: Insufficient documentation

## 2014-06-23 DIAGNOSIS — Z0289 Encounter for other administrative examinations: Secondary | ICD-10-CM

## 2014-06-27 ENCOUNTER — Ambulatory Visit (INDEPENDENT_AMBULATORY_CARE_PROVIDER_SITE_OTHER): Payer: 59 | Admitting: Family Medicine

## 2014-06-27 ENCOUNTER — Encounter: Payer: Self-pay | Admitting: Family Medicine

## 2014-06-27 VITALS — BP 136/82 | HR 93 | Ht 61.5 in | Wt 140.0 lb

## 2014-06-27 DIAGNOSIS — M9903 Segmental and somatic dysfunction of lumbar region: Secondary | ICD-10-CM | POA: Diagnosis not present

## 2014-06-27 DIAGNOSIS — F32A Depression, unspecified: Secondary | ICD-10-CM

## 2014-06-27 DIAGNOSIS — M4722 Other spondylosis with radiculopathy, cervical region: Secondary | ICD-10-CM

## 2014-06-27 DIAGNOSIS — M999 Biomechanical lesion, unspecified: Secondary | ICD-10-CM

## 2014-06-27 DIAGNOSIS — F329 Major depressive disorder, single episode, unspecified: Secondary | ICD-10-CM

## 2014-06-27 DIAGNOSIS — M9901 Segmental and somatic dysfunction of cervical region: Secondary | ICD-10-CM | POA: Diagnosis not present

## 2014-06-27 DIAGNOSIS — M9902 Segmental and somatic dysfunction of thoracic region: Secondary | ICD-10-CM | POA: Diagnosis not present

## 2014-06-27 NOTE — Progress Notes (Signed)
Pre visit review using our clinic review tool, if applicable. No additional management support is needed unless otherwise documented below in the visit note. 

## 2014-06-27 NOTE — Patient Instructions (Signed)
We are on the right track Ice is your friend Conitnue to do the exercises  I like the effexor and lets leave the dose See me again in 4-5 weeks.

## 2014-06-27 NOTE — Progress Notes (Signed)
  [name] [place],  [address] Phone: [phone] Subjective:    CC: Bilateral shoulder and neck pain follow up  [id number] ERIN OBANDO is a 54 y.o. female coming in with complaint of bilateral shoulder pain. Patient is also complaining of significant neck pain. Patient did have injections in the shoulders bilaterally and had improvements. 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 Last injections in her shoulders 11/28/2013. Patient states that there is just mild dull achiness. Patient states that she has been doing the exercises regularly and has been feeling significantly better. Patient has had some difficulty recently because her as medical history significant for depression. We placed patient on Effexor. Patient did titrate off her other medications. Patient is feeling much better. No feelings of despair. Overall feels much more like herself.  Patient had x-rays of her neck that does show diffuse degenerative changes at multiple levels.     Past medical history, social, surgical and family history all reviewed in electronic medical record.   Review of Systems: No headache, visual changes, nausea, vomiting, diarrhea, constipation, dizziness, abdominal pain, skin rash, fevers, chills, night sweats, weight loss, swollen lymph nodes, body aches, joint swelling, muscle aches, chest pain, shortness of breath, mood changes.   Objective Blood pressure 136/82, pulse 93, height 5' 1.5" (1.562 m), weight 140 lb (63.504 kg), SpO2 98 %.  General: No apparent distress alert and oriented x3 mood and affect normal, dressed appropriately.  HEENT: Pupils equal, extraocular movements intact  Respiratory: Patient's speak in full sentences and does not appear short of breath  Cardiovascular: No lower extremity edema, non tender, no erythema  Skin: Warm dry intact with no signs of infection or rash on extremities or on axial skeleton.    Abdomen: Soft nontender  Neuro: Cranial nerves II through XII are intact, neurovascularly intact in all extremities with 2+ DTRs and 2+ pulses.  Lymph: No lymphadenopathy of posterior or anterior cervical chain or axillae bilaterally.  Gait normal with good balance and coordination.  MSK:  Non tender with full range of motion and good stability and symmetric strength and tone of shoulders, elbows, wrist, hip, knee and ankles bilaterally.  Back examination patient has some mild levoscoliosis of the thoracic spine, no change Neck: Inspection unremarkable. No palpable stepoffs. positiveSpurling's maneuver still present Lacking the last 5 in all range of motion.no significant worsening from previous exam Grip strength and sensation normal in bilateral hands Strength good C4 to T1 distribution No sensory change to C4 to T1 Negative Hoffman sign bilaterally Reflexes normal Shoulder: Bilateral Inspection reveals no abnormalities, atrophy or asymmetry. Palpation is normal with no tenderness over AC joint or bicipital groove. ROM is full in all planes. Rotator cuff strength normal throughout. Negative impingement No labral pathology noted with negative Obrien's, negative clunk and good stability.  Normal scapular function observed. No painful arc and no drop arm sign. No apprehension sign No significant change from previous exam    Osteopathic findings C2 flexed rotated and side bent left C6 flexed rotated and side bent right  Thoracic T4 extended rotated and side bent right T6 extended rotated and side bent left T7 extended rotated and side bent right  Lumbar L2 flexed rotated and side bent right  Sacrum left on left   Impression and Recommendations:     This case required medical decision making of moderate complexity.

## 2014-06-27 NOTE — Assessment & Plan Note (Signed)
Decision today to treat with OMT was based on Physical Exam  After verbal consent patient was treated with FPR and muscle energy techniques in cervical and thoracic and lumbar. areas  Patient tolerated the procedure well with improvement in symptoms  Patient given exercises, stretches and lifestyle modifications  See medications in patient instructions if given  Patient will follow up in 4-5 weeks

## 2014-06-27 NOTE — Assessment & Plan Note (Signed)
Patient is doing much better on Effexor. We'll not change the dose at this time we will follow-up again in 4 weeks. We may need to 8 titrate up in the next several weeks.

## 2014-06-27 NOTE — Assessment & Plan Note (Signed)
Doing better.  Icing. Discussed HEP Discussed posture changes.  RTC in 4-5 weeks.

## 2014-07-25 ENCOUNTER — Encounter: Payer: Self-pay | Admitting: Family Medicine

## 2014-07-25 ENCOUNTER — Ambulatory Visit (INDEPENDENT_AMBULATORY_CARE_PROVIDER_SITE_OTHER): Payer: 59 | Admitting: Family Medicine

## 2014-07-25 ENCOUNTER — Telehealth: Payer: Self-pay | Admitting: Family Medicine

## 2014-07-25 VITALS — BP 114/80 | HR 92 | Ht 61.5 in | Wt 144.0 lb

## 2014-07-25 DIAGNOSIS — F329 Major depressive disorder, single episode, unspecified: Secondary | ICD-10-CM | POA: Diagnosis not present

## 2014-07-25 DIAGNOSIS — M4722 Other spondylosis with radiculopathy, cervical region: Secondary | ICD-10-CM

## 2014-07-25 DIAGNOSIS — M62838 Other muscle spasm: Secondary | ICD-10-CM

## 2014-07-25 DIAGNOSIS — M9902 Segmental and somatic dysfunction of thoracic region: Secondary | ICD-10-CM | POA: Diagnosis not present

## 2014-07-25 DIAGNOSIS — M999 Biomechanical lesion, unspecified: Secondary | ICD-10-CM

## 2014-07-25 DIAGNOSIS — F32A Depression, unspecified: Secondary | ICD-10-CM

## 2014-07-25 NOTE — Progress Notes (Signed)
Pre visit review using our clinic review tool, if applicable. No additional management support is needed unless otherwise documented below in the visit note. 

## 2014-07-25 NOTE — Assessment & Plan Note (Signed)
Continues to be an underlying problem. We discussed icing regimen and home exercises. We discussed different postural changes throughout the day. Discussed with patient to take the over-the-counter natural supple mentation's on a more regular basis. Patient will try to remain more active.

## 2014-07-25 NOTE — Assessment & Plan Note (Signed)
Decision today to treat with OMT was based on Physical Exam  After verbal consent patient was treated with FPR and muscle energy techniques in cervical and thoracic and lumbar. areas  Patient tolerated the procedure well with improvement in symptoms  Patient given exercises, stretches and lifestyle modifications  See medications in patient instructions if given  Patient will follow up in 4-5 weeks

## 2014-07-25 NOTE — Patient Instructions (Addendum)
Good to see you.  You are doing great overall.  Ice is your friend Lets double the effexor and if you like it write me in 1 week and will get you a new prescription.  Continue the exercises Iorn 325mg  3 times daily See me again in 4-6 weeks.

## 2014-07-25 NOTE — Telephone Encounter (Signed)
Error

## 2014-07-25 NOTE — Assessment & Plan Note (Signed)
Likely metabolic deficiency. We discussed iron deficiency at this time. Patient is to try this. Patient come back and see me again in 2-3 weeks and we will side on laboratory our.

## 2014-07-25 NOTE — Assessment & Plan Note (Signed)
Trial of increase medication to 75 mg daily. Patient does well with this we will send a new prescription.

## 2014-07-25 NOTE — Progress Notes (Signed)
  [name] [place], [place] [address] Phone: [phone] Subjective:    CC: Bilateral shoulder and neck pain follow up  [id number] Tamara [name] is a 54 y.o. female coming in with complaint of bilateral shoulder pain. Patient is also complaining of significant neck pain. Patient did have injections in the shoulders bilaterally and had improvements. 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  Patient states this is more of a dull aching pain. Patient states that overall she has been doing relatively well but feels somewhat more stiff.   Patient is also complaining of a new problem. Patient is having muscle stiffness and tightness as well as cramping in the mornings. Seems to be mostly in the left leg. States that then he can be a dull aching pain later in the day. Patient states though that she can still do her daily activities. Has not made any significant changes and has been trying to eat bananas to help it.     Patient also had some trouble with depression anxiety. Patient is on Effexor and we've been on the same dose now for greater than 6 weeks. Patient states she is no longer noticing as much improvement. Patient states that she does not have any significant depressive signs but does not feel an overwhelming enthusiasm for the future. Patient denies any suicidal or homicidal ideation. Patient is continuing to see therapist  Past medical history, social, surgical and family history all reviewed in electronic medical record.   Review of Systems: No headache, visual changes, nausea, vomiting, diarrhea, constipation, dizziness, abdominal pain, skin rash, fevers, chills, night sweats, weight loss, swollen lymph nodes, body aches, joint swelling, muscle aches, chest pain, shortness of breath, mood changes.   Objective Blood pressure 114/80, pulse 92, height 5' 1.5" (1.562 m), weight 144 lb (65.318 kg), SpO2 98 %.  General: No apparent  distress alert and oriented x3 mood and affect normal, dressed appropriately.  HEENT: Pupils equal, extraocular movements intact  Respiratory: Patient's speak in full sentences and does not appear short of breath  Cardiovascular: No lower extremity edema, non tender, no erythema  Skin: Warm dry intact with no signs of infection or rash on extremities or on axial skeleton.  Abdomen: Soft nontender  Neuro: Cranial nerves II through XII are intact, neurovascularly intact in all extremities with 2+ DTRs and 2+ pulses.  Lymph: No lymphadenopathy of posterior or anterior cervical chain or axillae bilaterally.  Gait normal with good balance and coordination.  MSK:  Non tender with full range of motion and good stability and symmetric strength and tone of shoulders, elbows, wrist, hip, knee and ankles bilaterally.  Back examination patient has some mild levoscoliosis of the thoracic spine, no change Neck: Inspection unremarkable. No palpable stepoffs. Negative Spurling's which is an improvement Lacking the last 5 in all range of motion.stable Grip strength and sensation normal in bilateral hands Strength good C4 to T1 distribution No sensory change to C4 to T1 Negative Hoffman sign bilaterally Reflexes normal     Osteopathic findings C2 flexed rotated and side bent left C6 flexed rotated and side bent right  Thoracic T4 extended rotated and side bent right T7 extended rotated and side bent right  Lumbar L2 flexed rotated and side bent right  Sacrum left on left   Impression and Recommendations:     This case required medical decision making of moderate complexity.

## 2014-08-05 ENCOUNTER — Encounter: Payer: Managed Care, Other (non HMO) | Admitting: Internal Medicine

## 2014-08-05 ENCOUNTER — Telehealth: Payer: Self-pay | Admitting: Family Medicine

## 2014-08-05 MED ORDER — VENLAFAXINE HCL ER 37.5 MG PO CP24: 37.5000 mg | ORAL_CAPSULE | Freq: Every day | ORAL | Status: DC

## 2014-08-05 NOTE — Telephone Encounter (Signed)
Patient is calling requesting a refill for venlafaxine XR (EFFEXOR XR) 37.5 MG 24 hr capsule [[id number]] . Today was her last dose she has and she gets dizzy if she doesn't take it

## 2014-08-05 NOTE — Telephone Encounter (Signed)
Refill done.  

## 2014-08-08 ENCOUNTER — Encounter: Payer: Self-pay | Admitting: Internal Medicine

## 2014-08-08 ENCOUNTER — Ambulatory Visit (INDEPENDENT_AMBULATORY_CARE_PROVIDER_SITE_OTHER): Payer: 59 | Admitting: Internal Medicine

## 2014-08-08 ENCOUNTER — Other Ambulatory Visit (INDEPENDENT_AMBULATORY_CARE_PROVIDER_SITE_OTHER): Payer: 59

## 2014-08-08 VITALS — BP 124/90 | HR 83 | Temp 98.0°F | Ht 61.5 in | Wt 146.0 lb

## 2014-08-08 DIAGNOSIS — Z0000 Encounter for general adult medical examination without abnormal findings: Secondary | ICD-10-CM

## 2014-08-08 DIAGNOSIS — F329 Major depressive disorder, single episode, unspecified: Secondary | ICD-10-CM

## 2014-08-08 DIAGNOSIS — N924 Excessive bleeding in the premenopausal period: Secondary | ICD-10-CM | POA: Diagnosis not present

## 2014-08-08 DIAGNOSIS — I10 Essential (primary) hypertension: Secondary | ICD-10-CM | POA: Diagnosis not present

## 2014-08-08 DIAGNOSIS — F32A Depression, unspecified: Secondary | ICD-10-CM

## 2014-08-08 DIAGNOSIS — N920 Excessive and frequent menstruation with regular cycle: Secondary | ICD-10-CM | POA: Insufficient documentation

## 2014-08-08 LAB — CBC WITH DIFFERENTIAL/PLATELET
BASOS PCT: 0.3 % (ref 0.0–3.0)
Basophils Absolute: 0 10*3/uL (ref 0.0–0.1)
EOS PCT: 9.7 % — AB (ref 0.0–5.0)
Eosinophils Absolute: 0.7 10*3/uL (ref 0.0–0.7)
HCT: 41.1 % (ref 36.0–46.0)
Hemoglobin: 13.7 g/dL (ref 12.0–15.0)
LYMPHS PCT: 22.9 % (ref 12.0–46.0)
Lymphs Abs: 1.6 10*3/uL (ref 0.7–4.0)
MCHC: 33.3 g/dL (ref 30.0–36.0)
MCV: 99.3 fl (ref 78.0–100.0)
Monocytes Absolute: 0.5 10*3/uL (ref 0.1–1.0)
Monocytes Relative: 7.3 % (ref 3.0–12.0)
NEUTROS PCT: 59.8 % (ref 43.0–77.0)
Neutro Abs: 4.1 10*3/uL (ref 1.4–7.7)
Platelets: 333 10*3/uL (ref 150.0–400.0)
RBC: 4.14 Mil/uL (ref 3.87–5.11)
RDW: 12.2 % (ref 11.5–15.5)
WBC: 6.9 10*3/uL (ref 4.0–10.5)

## 2014-08-08 LAB — IBC PANEL
Iron: 110 ug/dL (ref 42–145)
SATURATION RATIOS: 26.4 % (ref 20.0–50.0)
Transferrin: 298 mg/dL (ref 212.0–360.0)

## 2014-08-08 LAB — HEPATIC FUNCTION PANEL
ALBUMIN: 3.9 g/dL (ref 3.5–5.2)
ALT: 18 U/L (ref 0–35)
AST: 23 U/L (ref 0–37)
Alkaline Phosphatase: 39 U/L (ref 39–117)
BILIRUBIN DIRECT: 0.1 mg/dL (ref 0.0–0.3)
TOTAL PROTEIN: 6.5 g/dL (ref 6.0–8.3)
Total Bilirubin: 0.6 mg/dL (ref 0.2–1.2)

## 2014-08-08 LAB — LIPID PANEL
Cholesterol: 182 mg/dL (ref 0–200)
HDL: 79.6 mg/dL (ref >39.00)
LDL Cholesterol: 88 mg/dL (ref 0–99)
NonHDL: 102.4
TRIGLYCERIDES: 73 mg/dL (ref 0.0–149.0)
Total CHOL/HDL Ratio: 2
VLDL: 14.6 mg/dL (ref 0.0–40.0)

## 2014-08-08 LAB — BASIC METABOLIC PANEL
BUN: 11 mg/dL (ref 6–23)
CHLORIDE: 106 meq/L (ref 96–112)
CO2: 30 meq/L (ref 19–32)
CREATININE: 0.7 mg/dL (ref 0.40–1.20)
Calcium: 8.3 mg/dL — ABNORMAL LOW (ref 8.4–10.5)
GFR: 93.53 mL/min (ref >60.00)
GLUCOSE: 87 mg/dL (ref 70–99)
POTASSIUM: 4 meq/L (ref 3.5–5.1)
SODIUM: 139 meq/L (ref 135–145)

## 2014-08-08 LAB — TSH: TSH: 0.92 u[IU]/mL (ref 0.35–4.50)

## 2014-08-08 MED ORDER — VALSARTAN 320 MG PO TABS
320.0000 mg | ORAL_TABLET | Freq: Every day | ORAL | Status: DC
Start: 2014-08-08 — End: 2015-08-17

## 2014-08-08 NOTE — Assessment & Plan Note (Signed)
Ongoing, o/w stable overall by history and exam, recent data reviewed with pt, and pt to continue medical treatment as before,  to f/u any worsening symptoms or concerns Lab Results  Component Value Date   WBC 5.6 07/31/2013   HGB 13.6 07/31/2013   HCT 40.2 07/31/2013   MCV 99.0 07/31/2013   PLT 316.0 07/31/2013   For f/u cbc, iron panel today

## 2014-08-08 NOTE — Patient Instructions (Signed)
Please continue all other medications as before, and refills have been done if requested - the Diovan  Please have the pharmacy call with any other refills you may need.  Please continue your efforts at being more active, low cholesterol diet, and weight control.  Please keep your appointments with your specialists as you may have planned  Please go to the LAB in the Basement (turn left off the elevator) for the tests to be done today  You will be contacted by phone if any changes need to be made immediately.  Otherwise, you will receive a letter about your results with an explanation, but please check with MyChart first.  Please remember to sign up for MyChart if you have not done so, as this will be important to you in the future with finding out test results, communicating by private email, and scheduling acute appointments online when needed.  Please return in 1 year for your yearly visit, or sooner if needed

## 2014-08-08 NOTE — Progress Notes (Signed)
Subjective:    Patient ID: Tamara [name], female    DOB: 1960/09/09, 54 y.o.   MRN: [id number]  HPI  Here to f/u; overall doing ok,  Pt denies chest pain, increasing sob or doe, wheezing, orthopnea, PND, increased LE swelling, palpitations, dizziness or syncope.  Pt denies new neurological symptoms such as new headache, or facial or extremity weakness or numbness.  Pt denies polydipsia, polyuria, or low sugar episode.   Pt denies new neurological symptoms such as new headache, or facial or extremity weakness or numbness.   Pt states overall good compliance with meds, mostly trying to follow appropriate diet, with wt overall stable,  but little exercise however.  Now on effexor for pain and depression.  C/o fatigue, lethargic, tired.  Thought to be possible anemia based on PE, pt now started on slow iron 3 times weekly.  No daytime somnolence.  Has worsening menorrhoagia for several yrs, seen GYN for several yrs, now seeing a different GYN, back on BCP, but even on pills still excessive to her for longer time.  Plans to see GYN soon in f/u.  Has known fibroids, not yet suggested for TAH Past Medical History  Diagnosis Date  . Depression   . Hypertension   . Allergic rhinitis, mild    No past surgical history on file.  reports that she has never smoked. She does not have any smokeless tobacco history on file. Her alcohol and drug histories are not on file. family history is not on file. No Known Allergies Current Outpatient Prescriptions on File Prior to Visit  Medication Sig Dispense Refill  . ALPRAZolam (XANAX) 0.5 MG tablet     . budesonide (RHINOCORT AQUA) 32 MCG/ACT nasal spray Place 2 sprays into both nostrils daily. 8.6 g 5  . cyclobenzaprine (FLEXERIL) 5 MG tablet Take 1 tablet (5 mg total) by mouth 3 (three) times daily as needed for muscle spasms. 30 tablet 0  . meloxicam (MOBIC) 15 MG tablet Take 1 tablet (15 mg total) by mouth daily as needed. 90 tablet 1  . valsartan  (DIOVAN) 320 MG tablet take 1 tablet by mouth once daily 90 tablet 3  . venlafaxine XR (EFFEXOR XR) 37.5 MG 24 hr capsule Take 1 capsule (37.5 mg total) by mouth daily with breakfast. (Patient taking differently: Take 75 mg by mouth daily with breakfast. ) 30 capsule 3   No current facility-administered medications on file prior to visit.    Review of Systems  Constitutional: Negative for unusual diaphoresis or night sweats HENT: Negative for ringing in ear or discharge Eyes: Negative for double vision or worsening visual disturbance.  Respiratory: Negative for choking and stridor.   Gastrointestinal: Negative for vomiting or other signifcant bowel change Genitourinary: Negative for hematuria or change in urine volume.  Musculoskeletal: Negative for other MSK pain or swelling Skin: Negative for color change and worsening wound.  Neurological: Negative for tremors and numbness other than noted  Psychiatric/Behavioral: Negative for decreased concentration or agitation other than above       Objective:   Physical Exam BP 124/90 mmHg  Pulse 83  Temp(Src) 98 F (36.7 C) (Oral)  Ht 5' 1.5" (1.562 m)  Wt 146 lb (66.225 kg)  BMI 27.14 kg/m2 VS noted,  Constitutional: Pt appears in no significant distress HENT: Head: NCAT.  Right Ear: External ear normal.  Left Ear: External ear normal.  Eyes: . Pupils are equal, round, and reactive to light. Conjunctivae and EOM are normal Neck: Normal  range of motion. Neck supple.  Cardiovascular: Normal rate and regular rhythm.   Pulmonary/Chest: Effort normal and breath sounds without rales or wheezing.  Abd:  Soft, NT, ND, + BS Neurological: Pt is alert. Not confused , motor grossly intact Skin: Skin is warm. No rash, no LE edema Psychiatric: Pt behavior is normal. No agitation. mild nervous    Assessment & Plan:

## 2014-08-08 NOTE — Progress Notes (Signed)
Pre visit review using our clinic review tool, if applicable. No additional management support is needed unless otherwise documented below in the visit note. 

## 2014-08-08 NOTE — Assessment & Plan Note (Signed)
stable overall by history and exam, recent data reviewed with pt, and pt to continue medical treatment as before,  to f/u any worsening symptoms or concerns Lab Results  Component Value Date   WBC 5.6 07/31/2013   HGB 13.6 07/31/2013   HCT 40.2 07/31/2013   PLT 316.0 07/31/2013   GLUCOSE 85 07/31/2013   CHOL 167 07/31/2013   TRIG 46.0 07/31/2013   HDL 96.30 07/31/2013   LDLCALC 62 07/31/2013   ALT 20 07/31/2013   AST 29 07/31/2013   NA 137 07/31/2013   K 4.4 07/31/2013   CL 103 07/31/2013   CREATININE 0.6 07/31/2013   BUN 9 07/31/2013   CO2 27 07/31/2013   TSH 1.32 07/31/2013

## 2014-08-08 NOTE — Addendum Note (Signed)
Addended by: Townsend Roger D on: 08/08/2014 03:34 PM   Modules accepted: Orders

## 2014-08-08 NOTE — Assessment & Plan Note (Signed)
stable overall by history and exam, recent data reviewed with pt, and pt to continue medical treatment as before,  to f/u any worsening symptoms or concerns BP Readings from Last 3 Encounters:  08/08/14 124/90  07/25/14 114/80  06/27/14 136/82

## 2014-08-22 ENCOUNTER — Ambulatory Visit (INDEPENDENT_AMBULATORY_CARE_PROVIDER_SITE_OTHER): Payer: 59 | Admitting: Family Medicine

## 2014-08-22 ENCOUNTER — Ambulatory Visit (INDEPENDENT_AMBULATORY_CARE_PROVIDER_SITE_OTHER)
Admission: RE | Admit: 2014-08-22 | Discharge: 2014-08-22 | Disposition: A | Discharge status: Home / Self Care | Payer: 59 | Source: Ambulatory Visit | Attending: Family Medicine | Admitting: Family Medicine

## 2014-08-22 ENCOUNTER — Encounter: Payer: Self-pay | Admitting: Family Medicine

## 2014-08-22 VITALS — BP 118/82 | HR 93 | Ht 61.5 in | Wt 146.0 lb

## 2014-08-22 DIAGNOSIS — M9901 Segmental and somatic dysfunction of cervical region: Secondary | ICD-10-CM

## 2014-08-22 DIAGNOSIS — M4722 Other spondylosis with radiculopathy, cervical region: Secondary | ICD-10-CM | POA: Diagnosis not present

## 2014-08-22 DIAGNOSIS — M4806 Spinal stenosis, lumbar region: Secondary | ICD-10-CM | POA: Diagnosis not present

## 2014-08-22 DIAGNOSIS — M5416 Radiculopathy, lumbar region: Secondary | ICD-10-CM

## 2014-08-22 DIAGNOSIS — M9903 Segmental and somatic dysfunction of lumbar region: Secondary | ICD-10-CM

## 2014-08-22 DIAGNOSIS — F329 Major depressive disorder, single episode, unspecified: Secondary | ICD-10-CM

## 2014-08-22 DIAGNOSIS — M9902 Segmental and somatic dysfunction of thoracic region: Secondary | ICD-10-CM

## 2014-08-22 DIAGNOSIS — M48061 Spinal stenosis, lumbar region without neurogenic claudication: Secondary | ICD-10-CM

## 2014-08-22 DIAGNOSIS — F32A Depression, unspecified: Secondary | ICD-10-CM

## 2014-08-22 DIAGNOSIS — M999 Biomechanical lesion, unspecified: Secondary | ICD-10-CM

## 2014-08-22 MED ORDER — VENLAFAXINE HCL 75 MG PO TABS: 75.0000 mg | ORAL_TABLET | Freq: Every day | ORAL | Status: DC

## 2014-08-22 MED ORDER — HYDROXYZINE HCL 10 MG PO TABS: 10.0000 mg | ORAL_TABLET | Freq: Three times a day (TID) | ORAL | Status: DC | PRN

## 2014-08-22 NOTE — Patient Instructions (Addendum)
Good to see you Xray today Meloxicam daily for 5 days Call me next week if worsens and we will do prednisone Refilled effexor at higher dose.  Labs look great 1 pill 3 times a week for the iron Try the hydroxyzine instead of xanax on the weekend.  See me again in 3-4 weeks.

## 2014-08-22 NOTE — Progress Notes (Signed)
Pre visit review using our clinic review tool, if applicable. No additional management support is needed unless otherwise documented below in the visit note. 

## 2014-08-22 NOTE — Progress Notes (Signed)
Tamara [name], Tamara [name] Phone: [phone] Subjective:    CC: Bilateral shoulder and neck pain follow up  Tamara [name] is a 54 y.o. female coming in with complaint of bilateral shoulder pain. Patient is also complaining of significant neck pain. Patient did have injections in the shoulders bilaterally and had improvements. 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  Patient states that this is not affecting her daily activities at this time. States that the tightness in the shoulders bilaterally has improved significant as well. Denies any numbness or tingling.  Patient was having lower leg cramping at night. Patient was to take over-the-counter iron. Patient states that the cramping has improved somewhat. Patient though states that now it seems to be related to more of the lower back pain. Patient states that it is starting to feel fairly similar to when she had the laminectomies. 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 Patient is concerned week and she does not want this to start happening on a regular basis. Patient also wants to avoid any type of surgery.     Patient also had some trouble with depression anxiety. Asian has been doing very well with the Effexor but we did double her dose after last encounter. Patient is not taking 75 mg daily. Patient states this is helped out significantly. Still has some anxiety and continues to use Xanax 1 time daily.  Past medical history, social, surgical and family history all reviewed in electronic medical record.   Review of Systems: No headache, visual changes, nausea, vomiting, diarrhea, constipation, dizziness, abdominal pain, skin rash, fevers, chills, night sweats, weight loss, swollen lymph nodes, body aches, joint swelling, muscle aches, chest pain, shortness of breath, mood changes.    Objective Blood pressure 118/82, pulse 93, height 5' 1.5" (1.562 m), weight 146 lb (66.225 kg), last menstrual period 08/08/2014, SpO2 96 %.  General: No apparent distress alert and oriented x3 mood and affect normal, dressed appropriately.  HEENT: Pupils equal, extraocular movements intact  Respiratory: Patient's speak in full sentences and does not appear short of breath  Cardiovascular: No lower extremity edema, non tender, no erythema  Skin: Warm dry intact with no signs of infection or rash on extremities or on axial skeleton.  Abdomen: Soft nontender  Neuro: Cranial nerves II through XII are intact, neurovascularly intact in all extremities with 2+ DTRs and 2+ pulses.  Lymph: No lymphadenopathy of posterior or anterior cervical chain or axillae bilaterally.  Gait normal with good balance and coordination.  MSK:  Non tender with full range of motion and good stability and symmetric strength and tone of shoulders, elbows, wrist, hip, knee and ankles bilaterally.  Back examination patient has some mild levoscoliosis of the thoracic spine, no change Neck: Inspection unremarkable. No palpable stepoffs. Negative Spurling's which is an improvement Lacking the last 5 in all range of motion.stable Grip strength and sensation normal in bilateral hands Strength good C4 to T1 distribution No sensory change to C4 to T1 Negative Hoffman sign bilaterally Reflexes normal Back Exam:  Inspection: Unremarkable  Motion: Flexion 45 deg, Extension 45 deg, Side Bending to 45 deg bilaterally,  Rotation to 45 deg bilaterally  SLR laying: Negative  XSLR laying: Negative  Palpable tenderness: Mild tenderness to palpation over the paraspinal musculature of the lumbar spine. FABER: negative. Sensory change: Gross sensation intact to all lumbar and sacral dermatomes.  Reflexes: 2+ at both patellar tendons, 2+ at achilles tendons, Babinski's downgoing.  Strength at foot  Plantar-flexion: 5/5  Dorsi-flexion: 5/5 Eversion: 5/5 Inversion: 5/5  Leg strength  Quad: 5/5 Hamstring: 5/5 Hip flexor: 5/5 Hip abductors: 5/5  Gait unremarkable.     Osteopathic findings C2 flexed rotated and side bent left C6 flexed rotated and side bent right  Thoracic T4 extended rotated and side bent right T7 extended rotated and side bent right  Lumbar L2 flexed rotated and side bent right  Sacrum left on left   Impression and Recommendations:     This case required medical decision making of moderate complexity.

## 2014-08-22 NOTE — Assessment & Plan Note (Signed)
Patient does have osteophytic changes of the neck and we'll continue to have some discomfort. Seems to be doing relatively well. Conitnue to be doing well no significant changes.

## 2014-08-22 NOTE — Assessment & Plan Note (Signed)
Doing better at the higher Effexor dose. With activity and get her off of Xanax on a daily basis and patient was given hydroxyzine. Patient may need Effexor at 150 mg.

## 2014-08-22 NOTE — Assessment & Plan Note (Signed)
Patient overall is having some mild exacerbation. Patients have muscle relaxers if needed. We discussed the possibility of gabapentin another medication if necessary. We also discussed the possibility of needing prednisone in the near future. We discussed icing regimen. Patient will start doing more range of motion exercises and see me again in 3-4 weeks.

## 2014-08-22 NOTE — Assessment & Plan Note (Signed)
Decision today to treat with OMT was based on Physical Exam  After verbal consent patient was treated with FPR and muscle energy techniques in cervical and thoracic and lumbar. areas  Patient tolerated the procedure well with improvement in symptoms  Patient given exercises, stretches and lifestyle modifications  See medications in patient instructions if given  Patient will follow up in 4-5 weeks

## 2014-08-25 ENCOUNTER — Telehealth: Payer: Self-pay | Admitting: Family Medicine

## 2014-08-25 NOTE — Telephone Encounter (Signed)
Discussed with pt

## 2014-08-25 NOTE — Telephone Encounter (Signed)
Please call patient regarding her xray results Best # is [phone]

## 2014-09-11 ENCOUNTER — Telehealth: Payer: Self-pay | Admitting: Family Medicine

## 2014-09-11 MED ORDER — VENLAFAXINE HCL 37.5 MG PO TABS: 37.5000 mg | ORAL_TABLET | Freq: Every day | ORAL | Status: DC

## 2014-09-11 NOTE — Telephone Encounter (Signed)
Discussed with pt. She states she would like to wait on the tramadol.

## 2014-09-11 NOTE — Telephone Encounter (Signed)
Sent in new dose of effexor medicine and see if it helps.  Take flexeril at night Can call in tramadol Keep appointment.

## 2014-09-11 NOTE — Telephone Encounter (Signed)
Patient called regarding: She still is not adjusting to the Effexor. She's still experiencing a lot of dizziness. She would like a lower dose. She will understand if you want her to give it more time. The pain in her lower back is getting worse. She has been taking the meloxican every day and she doesn't want to do that and she's not sure what to do. She's wondering if her appointment on Friday next week will be ok or if you want her to come in earlier.  Please call patient at [phone]

## 2014-09-19 ENCOUNTER — Ambulatory Visit (INDEPENDENT_AMBULATORY_CARE_PROVIDER_SITE_OTHER): Payer: 59 | Admitting: Family Medicine

## 2014-09-19 ENCOUNTER — Encounter: Payer: Self-pay | Admitting: Family Medicine

## 2014-09-19 VITALS — BP 126/84 | HR 99 | Wt 148.0 lb

## 2014-09-19 DIAGNOSIS — M9903 Segmental and somatic dysfunction of lumbar region: Secondary | ICD-10-CM

## 2014-09-19 DIAGNOSIS — F329 Major depressive disorder, single episode, unspecified: Secondary | ICD-10-CM

## 2014-09-19 DIAGNOSIS — M9901 Segmental and somatic dysfunction of cervical region: Secondary | ICD-10-CM

## 2014-09-19 DIAGNOSIS — M9902 Segmental and somatic dysfunction of thoracic region: Secondary | ICD-10-CM | POA: Diagnosis not present

## 2014-09-19 DIAGNOSIS — M5416 Radiculopathy, lumbar region: Secondary | ICD-10-CM | POA: Diagnosis not present

## 2014-09-19 DIAGNOSIS — M545 Low back pain, unspecified: Secondary | ICD-10-CM

## 2014-09-19 DIAGNOSIS — M999 Biomechanical lesion, unspecified: Secondary | ICD-10-CM

## 2014-09-19 DIAGNOSIS — F32A Depression, unspecified: Secondary | ICD-10-CM

## 2014-09-19 MED ORDER — FLUTICASONE PROPIONATE 50 MCG/ACT NA SUSP: 2.0000 | Freq: Every day | NASAL | Status: DC

## 2014-09-19 NOTE — Patient Instructions (Addendum)
Good to see you Each nostril daily for next 2 weeks.  For the back lets try manipulation and if not better call me and we will get you [facility]  If we decide to do MRI I will send you a message and likely order epidural.  See me then 1-2 weeks after epidural.     Benign Positional Vertigo Vertigo means you feel like you or your surroundings are moving when they are not. Benign positional vertigo is the most common form of vertigo. Benign means that the cause of your condition is not serious. Benign positional vertigo is more common in older adults. CAUSES  Benign positional vertigo is the result of an upset in the labyrinth system. This is an area in the middle ear that helps control your balance. This may be caused by a viral infection, head injury, or repetitive motion. However, often no specific cause is found. SYMPTOMS  Symptoms of benign positional vertigo occur when you move your head or eyes in different directions. Some of the symptoms may include:  Loss of balance and falls.  Vomiting.  Blurred vision.  Dizziness.  Nausea.  Involuntary eye movements (nystagmus). DIAGNOSIS  Benign positional vertigo is usually diagnosed by physical exam. If the specific cause of your benign positional vertigo is unknown, your caregiver may perform imaging tests, such as magnetic resonance imaging (MRI) or computed tomography (CT). TREATMENT  Your caregiver may recommend movements or procedures to correct the benign positional vertigo. Medicines such as meclizine, benzodiazepines, and medicines for nausea may be used to treat your symptoms. In rare cases, if your symptoms are caused by certain conditions that affect the inner ear, you may need surgery. HOME CARE INSTRUCTIONS   Follow your caregiver's instructions.  Move slowly. Do not make sudden body or head movements.  Avoid driving.  Avoid operating heavy machinery.  Avoid  performing any tasks that would be dangerous to you or others during a vertigo episode.  Drink enough fluids to keep your urine clear or pale yellow. SEEK IMMEDIATE MEDICAL CARE IF:   You develop problems with walking, weakness, numbness, or using your arms, hands, or legs.  You have difficulty speaking.  You develop severe headaches.  Your nausea or vomiting continues or gets worse.  You develop visual changes.  Your family or friends notice any behavioral changes.  Your condition gets worse.  You have a fever.  You develop a stiff neck or sensitivity to light. MAKE SURE YOU:   Understand these instructions.  Will watch your condition.  Will get help right away if you are not doing well or get worse. Document Released: 11/08/2005 Document Revised: 04/25/2011 Document Reviewed: 10/21/2010 [facility] Patient Information 2015 [place], [place]. This information is not intended to replace advice given to you by your health care provider. Make sure you discuss any questions you have with your health care provider.

## 2014-09-19 NOTE — Assessment & Plan Note (Signed)
Patient is having more lumbar radiculopathy than previous exam. This is a new finding. Patient has had some progression on the x-ray and a new feel that further imaging is warranted with this affecting her daily activities. Patient will have the MRI done and we will discuss different treatment options including epidural, formal physical therapy, or possible surgical intervention. In the interim patient will continue the different medications. We discussed other medications a could potentially take at night which patient declines including gabapentin. Patient will come back and see me as we discussed the padding on the MRI.  Spent  25 minutes with patient face-to-face and had greater than 50% of counseling including as described above in assessment and plan.

## 2014-09-19 NOTE — Progress Notes (Signed)
[name] [address] [place], [place] [address] Phone: [phone] Subjective:    CC: Bilateral shoulder and neck pain follow up  [id number] Tamara [name] is a 54 y.o. female coming in with complaint of bilateral shoulder pain. Patient is also complaining of significant neck pain. Patient did have injections in the shoulders bilaterally and had improvements. 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  Patient states that the neck pain seems to be overall controlled.  Patient though continues to have more of a low back pain. Patient did have repeat x-rays. Patient show some slight progression from 8 2012 MRI of the degenerative disc disease at multiple levels. Patient continues to have a significant pain that can be sharp from time to time with certain movements. Some mild radicular symptoms down the leg. Starting to affect her daily activities as well as her job. States that the radiation seems to be more constant now and her leg feels heavy.      Patient also had some trouble with depression anxiety. Patient was taking Effexor at 75 mg and was doing very well. Patient unfortunately felt that she was getting dizzy secondary to it. Continued to have some dizziness even at the 37.5. Patient states that now she is not feeling like she is having as much control over the depression and is very frustrated overall.  Past medical history, social, surgical and family history all reviewed in electronic medical record.   Review of Systems: No headache, visual changes, nausea, vomiting, diarrhea, constipation, dizziness, abdominal pain, skin rash, fevers, chills, night sweats, weight loss, swollen lymph nodes, body aches, joint swelling, muscle aches, chest pain, shortness of breath, mood changes.   Objective Blood pressure 126/84, pulse 99, weight 148 lb (67.132 kg), last menstrual period 08/08/2014, SpO2 98 %.  General: No apparent distress alert and  oriented x3 mood and affect normal, dressed appropriately.  HEENT: Pupils equal, extraocular movements intact tympanic membrane on the right ear does have some bulging as well as an air fluid level. Respiratory: Patient's speak in full sentences and does not appear short of breath  Cardiovascular: No lower extremity edema, non tender, no erythema  Skin: Warm dry intact with no signs of infection or rash on extremities or on axial skeleton.  Abdomen: Soft nontender  Neuro: Cranial nerves II through XII are intact, neurovascularly intact in all extremities with 2+ DTRs and 2+ pulses.  Lymph: No lymphadenopathy of posterior or anterior cervical chain or axillae bilaterally.  Gait normal with good balance and coordination.  MSK:  Non tender with full range of motion and good stability and symmetric strength and tone of shoulders, elbows, wrist, hip, knee and ankles bilaterally.  Back examination patient has some mild levoscoliosis of the thoracic spine, no change Neck: Inspection unremarkable. No palpable stepoffs. Negative Spurling's  Lacking the last 5 in all range of motion.stable Grip strength and sensation normal in bilateral hands Strength good C4 to T1 distribution No sensory change to C4 to T1 Negative Hoffman sign bilaterally Reflexes normal Back Exam:  Inspection: Unremarkable  Motion: Flexion 45 deg, Extension 45 deg, Side Bending to 45 deg bilaterally,  Rotation to 45 deg bilaterally  SLR laying: Positive left side XSLR laying: Negative  Palpable tenderness: Increasing tenderness noted of the paraspinal musculature of the lumbar spine. FABER: negative. Sensory change: Gross sensation intact to all lumbar and sacral dermatomes.  Reflexes: 2+ at both patellar tendons, 2+ at achilles tendons, Babinski's downgoing.  Strength at foot  Plantar-flexion: 5/5 Dorsi-flexion: 5/5 Eversion: 5/5 Inversion: 5/5  Leg strength  Quad: 5/5 Hamstring: 5/5 Hip flexor: 5/5 Hip abductors: 5/5    Gait unremarkable.   Osteopathic findings C2 flexed rotated and side bent left C5 flexed rotated and side bent right  Thoracic T4 extended rotated and side bent right T7 extended rotated and side bent right  Lumbar L2 flexed rotated and side bent right  Sacrum left on left   Impression and Recommendations:     This case required medical decision making of moderate complexity.

## 2014-09-19 NOTE — Progress Notes (Signed)
Pre visit review using our clinic review tool, if applicable. No additional management support is needed unless otherwise documented below in the visit note. 

## 2014-09-19 NOTE — Assessment & Plan Note (Signed)
Decision today to treat with OMT was based on Physical Exam  After verbal consent patient was treated with FPR and muscle energy techniques in cervical and thoracic and lumbar. areas  Patient tolerated the procedure well with improvement in symptoms  Patient given exercises, stretches and lifestyle modifications  See medications in patient instructions if given  Patient will follow up in 4 weeks but depending on patient's imaging.

## 2014-09-19 NOTE — Assessment & Plan Note (Signed)
Visit patient at this time. I do think that this medication was being beneficial and I would like her to go up. Patient is concerned but will continue the 37.5 mA. I believe the patient's dizziness is sore secondary to her seasonal allergies. Patient given and no spray that she will do daily for at least the next 2 weeks. We'll see if this helps. Patient will call in 2 weeks to give [place] an update.

## 2014-09-29 ENCOUNTER — Telehealth: Payer: Self-pay | Admitting: Family Medicine

## 2014-09-29 NOTE — Telephone Encounter (Signed)
Spoke with pt, made her aware we have sent all of the info to her insurance company & we are just waiting to hear back from them. Her order is still pending with them.

## 2014-09-29 NOTE — Telephone Encounter (Signed)
Pt received a letter from insurance company needing more information about her MRI Please call her [phone]

## 2014-10-07 ENCOUNTER — Other Ambulatory Visit: Payer: Self-pay | Admitting: Obstetrics & Gynecology

## 2014-10-07 ENCOUNTER — Ambulatory Visit (INDEPENDENT_AMBULATORY_CARE_PROVIDER_SITE_OTHER): Payer: 59 | Admitting: Obstetrics & Gynecology

## 2014-10-07 ENCOUNTER — Encounter: Payer: Self-pay | Admitting: Obstetrics & Gynecology

## 2014-10-07 VITALS — BP 148/82 | HR 108 | Resp 16 | Ht 61.5 in | Wt 147.0 lb

## 2014-10-07 DIAGNOSIS — Z01419 Encounter for gynecological examination (general) (routine) without abnormal findings: Secondary | ICD-10-CM | POA: Diagnosis not present

## 2014-10-07 DIAGNOSIS — Z124 Encounter for screening for malignant neoplasm of cervix: Secondary | ICD-10-CM

## 2014-10-07 DIAGNOSIS — N920 Excessive and frequent menstruation with regular cycle: Secondary | ICD-10-CM

## 2014-10-07 DIAGNOSIS — Z1231 Encounter for screening mammogram for malignant neoplasm of breast: Secondary | ICD-10-CM

## 2014-10-07 DIAGNOSIS — Z0000 Encounter for general adult medical examination without abnormal findings: Secondary | ICD-10-CM

## 2014-10-07 DIAGNOSIS — Z1151 Encounter for screening for human papillomavirus (HPV): Secondary | ICD-10-CM

## 2014-10-07 LAB — TSH: TSH: 2.332 u[IU]/mL (ref 0.350–4.500)

## 2014-10-07 MED ORDER — NORETHINDRONE ACET-ETHINYL EST 1-20 MG-MCG PO TABS: 1.0000 | ORAL_TABLET | Freq: Every day | ORAL | Status: DC

## 2014-10-07 NOTE — Progress Notes (Signed)
Subjective:    Tamara [name] is a 54 y.o.  MW G60 female who presents for an annual exam. She says that for years she has had excessive menstrual bleeding. She is on the OCPs to keep her periods regular. The patient is sexually active. GYN screening history: last pap: was normal. The patient wears seatbelts: yes. The patient participates in regular exercise: yes. Has the patient ever been transfused or tattooed?: no. The patient reports that there is not domestic violence in her life.   Menstrual History: OB History    Gravida Para Term Preterm AB TAB SAB Ectopic Multiple Living   1    1  1          Menarche age: 19  Patient's last menstrual period was 09/29/2014.    The following portions of the patient's history were reviewed and updated as appropriate: allergies, current medications, past family history, past medical history, past social history, past surgical history and problem list.  Review of Systems Pertinent items are noted in HPI. Married for 21 years. 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 She has some dyspareunia and uses a lube to help.   Objective:    BP 148/82 mmHg  Pulse 108  Resp 16  Ht 5' 1.5" (1.562 m)  Wt 147 lb (66.679 kg)  BMI 27.33 kg/m2  LMP 09/29/2014  General Appearance:    Alert, cooperative, no distress, appears stated age  Head:    Normocephalic, without obvious abnormality, atraumatic  Eyes:    PERRL, conjunctiva/corneas clear, EOM's intact, fundi    benign, both eyes  Ears:    Normal TM's and external ear canals, both ears  Nose:   Nares normal, septum midline, mucosa normal, no drainage    or sinus tenderness  Throat:   Lips, mucosa, and tongue normal; teeth and gums normal  Neck:   Supple, symmetrical, trachea midline, no adenopathy;    thyroid:  no enlargement/tenderness/nodules; no carotid   bruit or JVD  Back:     Symmetric, no curvature, ROM normal, no CVA tenderness  Lungs:     Clear to auscultation bilaterally, respirations  unlabored  Chest Wall:    No tenderness or deformity   Heart:    Regular rate and rhythm, S1 and S2 normal, no murmur, rub   or gallop  Breast Exam:    No tenderness, masses, or nipple abnormality  Abdomen:     Soft, non-tender, bowel sounds active all four quadrants,    no masses, no organomegaly  Genitalia:    Normal female without lesion, discharge or tenderness, NSSA, NT, mobile, normal adnexal exam     Extremities:   Extremities normal, atraumatic, no cyanosis or edema  Pulses:   2+ and symmetric all extremities  Skin:   Skin color, texture, turgor normal, no rashes or lesions  Lymph nodes:   Cervical, supraclavicular, and axillary nodes normal  Neurologic:   CNII-XII intact, normal strength, sensation and reflexes    throughout  .    Assessment:    Healthy female exam.   Menorrhagia at 54 years of age   Plan:     Breast self exam technique reviewed and patient encouraged to perform self-exam monthly. Mammogram. Thin prep Pap smear.   Get records and check TSH [facility] after taking no OCPs.

## 2014-10-09 LAB — CYTOLOGY - PAP

## 2014-10-10 ENCOUNTER — Telehealth: Payer: Self-pay | Admitting: Internal Medicine

## 2014-10-10 MED ORDER — MELOXICAM 15 MG PO TABS: 15.0000 mg | ORAL_TABLET | Freq: Every day | ORAL | Status: DC | PRN

## 2014-10-10 NOTE — Telephone Encounter (Signed)
Patient is requesting refill for meloxicam (MOBIC) 15 MG tablet [[id number] I seen Dr. Jenny Reichmann was the last one to prescribe this but she insisted this should come to you since you are treating her back Please give her a call regarding the refill Pharmacy is [facility] on [address]

## 2014-10-10 NOTE — Telephone Encounter (Signed)
Refill done. Pt made aware.  

## 2014-10-15 ENCOUNTER — Other Ambulatory Visit (INDEPENDENT_AMBULATORY_CARE_PROVIDER_SITE_OTHER): Payer: 59 | Admitting: *Deleted

## 2014-10-15 DIAGNOSIS — N939 Abnormal uterine and vaginal bleeding, unspecified: Secondary | ICD-10-CM | POA: Diagnosis not present

## 2014-10-16 LAB — FOLLICLE STIMULATING HORMONE: FSH: 27 m[IU]/mL

## 2014-10-17 ENCOUNTER — Ambulatory Visit
Admission: RE | Admit: 2014-10-17 | Discharge: 2014-10-17 | Disposition: A | Discharge status: Home / Self Care | Payer: 59 | Source: Ambulatory Visit | Attending: Family Medicine | Admitting: Family Medicine

## 2014-10-17 DIAGNOSIS — M545 Low back pain, unspecified: Secondary | ICD-10-CM

## 2014-10-21 ENCOUNTER — Telehealth: Payer: Self-pay | Admitting: *Deleted

## 2014-10-21 DIAGNOSIS — N83209 Unspecified ovarian cyst, unspecified side: Secondary | ICD-10-CM

## 2014-10-21 NOTE — Telephone Encounter (Signed)
Will schedule pelvis U/S per Dr. Hulan Fray.  Patient has an appointment with Dr. Hulan Fray on 9/12

## 2014-10-23 ENCOUNTER — Encounter: Payer: Self-pay | Admitting: *Deleted

## 2014-10-24 ENCOUNTER — Ambulatory Visit (HOSPITAL_COMMUNITY): Payer: 59

## 2014-10-27 ENCOUNTER — Ambulatory Visit: Payer: 59 | Admitting: Obstetrics & Gynecology

## 2014-10-27 ENCOUNTER — Ambulatory Visit (INDEPENDENT_AMBULATORY_CARE_PROVIDER_SITE_OTHER): Payer: 59 | Admitting: Family Medicine

## 2014-10-27 ENCOUNTER — Encounter: Payer: Self-pay | Admitting: Family Medicine

## 2014-10-27 VITALS — BP 122/84 | HR 100 | Ht 61.5 in | Wt 146.0 lb

## 2014-10-27 DIAGNOSIS — M5416 Radiculopathy, lumbar region: Secondary | ICD-10-CM

## 2014-10-27 DIAGNOSIS — D721 Eosinophilia, unspecified: Secondary | ICD-10-CM

## 2014-10-27 DIAGNOSIS — M9903 Segmental and somatic dysfunction of lumbar region: Secondary | ICD-10-CM

## 2014-10-27 DIAGNOSIS — M9901 Segmental and somatic dysfunction of cervical region: Secondary | ICD-10-CM

## 2014-10-27 DIAGNOSIS — M999 Biomechanical lesion, unspecified: Secondary | ICD-10-CM

## 2014-10-27 DIAGNOSIS — M9902 Segmental and somatic dysfunction of thoracic region: Secondary | ICD-10-CM

## 2014-10-27 MED ORDER — ALBENDAZOLE 200 MG PO TABS
400.0000 mg | ORAL_TABLET | Freq: Two times a day (BID) | ORAL | Status: DC
Start: 2014-10-27 — End: 2014-12-09

## 2014-10-27 NOTE — Assessment & Plan Note (Signed)
Patient continues to have discomfort. Patient does have the L4-L5 and L5-S1 on the left side that likely has some mild nerve root impingement. I do think that an epidural could tell us more diagnostic and no plate therapeutic. This was ordered today. Patient encouraged to continue the home exercises in the icing pedicle. Patient will continue to work on posture. Patient will continue work on core strength. Patient come back and see me 2 weeks after the epidural.

## 2014-10-27 NOTE — Progress Notes (Signed)
Pre visit review using our clinic review tool, if applicable. No additional management support is needed unless otherwise documented below in the visit note. 

## 2014-10-27 NOTE — Assessment & Plan Note (Signed)
Decision today to treat with OMT was based on Physical Exam  After verbal consent patient was treated with FPR and muscle energy techniques in cervical and thoracic and lumbar. areas  Patient tolerated the procedure well with improvement in symptoms  Patient given exercises, stretches and lifestyle modifications  See medications in patient instructions if given  Patient will follow up in 2-4 weeks.

## 2014-10-27 NOTE — Assessment & Plan Note (Signed)
Patient's labs showed elevated eosinophils. 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 Patient has had chronic pain and other problems as well.

## 2014-10-27 NOTE — Patient Instructions (Addendum)
Good to see you Albendazole n2 times daily for up to 10 days Ice is your friend We will get epidural See me again 2 weeks after epidural to see how you are doing.

## 2014-10-27 NOTE — Progress Notes (Signed)
[name] [address] [place], [place] [address] Phone: [phone] Subjective:    CC: Bilateral shoulder and neck pain follow up  [id number] Tamara [name] is a 54 y.o. female coming in with complaint of bilateral shoulder pain. Patient is also complaining of significant neck pain. Patient did have injections in the shoulders bilaterally and had improvements. 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  Patient states that her neck pain seems to still be less than her back pain. Has had some vertigo though as well but improving slowly with nose spray.   Patient though continues to have more of a low back pain. Patient had repeat MRI showing mild progressive disc degeneration and facet hypertrophy multiple levels with mild narrowing of the left lateral recess at L4-L5 and L5 S1    Patient also had some trouble with depression anxiety. Continues on the 37.5 mg. Patient did not able to tolerate the higher dosing and will continue on the low dose. States though that that does make her feel better.  Past medical history, social, surgical and family history all reviewed in electronic medical record.   Review of Systems: No headache, visual changes, nausea, vomiting, diarrhea, constipation, dizziness, abdominal pain, skin rash, fevers, chills, night sweats, weight loss, swollen lymph nodes, body aches, joint swelling, muscle aches, chest pain, shortness of breath, mood changes.   Objective Blood pressure 122/84, pulse 100, height 5' 1.5" (1.562 m), weight 146 lb (66.225 kg), last menstrual period 09/29/2014, SpO2 98 %.  General: No apparent distress alert and oriented x3 mood and affect normal, dressed appropriately.  HEENT: Pupils equal, extraocular movements intact tympanic membrane on the right ear does have some bulging as well as an air fluid level. Respiratory: Patient's speak in full sentences and does not appear short of breath  Cardiovascular:  No lower extremity edema, non tender, no erythema  Skin: Warm dry intact with no signs of infection or rash on extremities or on axial skeleton.  Abdomen: Soft nontender  Neuro: Cranial nerves II through XII are intact, neurovascularly intact in all extremities with 2+ DTRs and 2+ pulses.  Lymph: No lymphadenopathy of posterior or anterior cervical chain or axillae bilaterally.  Gait normal with good balance and coordination.  MSK:  Non tender with full range of motion and good stability and symmetric strength and tone of shoulders, elbows, wrist, hip, knee and ankles bilaterally.  Back examination patient has some mild levoscoliosis of the thoracic spine, no change Neck: Inspection unremarkable. No palpable stepoffs. Negative Spurling's  Lacking the last 5 in all range of motion.stable Grip strength and sensation normal in bilateral hands Strength good C4 to T1 distribution No sensory change to C4 to T1 Negative Hoffman sign bilaterally Reflexes normal Back Exam:  Inspection: Unremarkable  Motion: Flexion 45 deg, Extension 45 deg, Side Bending to 45 deg bilaterally,  Rotation to 45 deg bilaterally  SLR laying: Positive left side still left L5 XSLR laying: Negative  Palpable tenderness: Increasing tenderness noted of the paraspinal musculature of the lumbar spine. FABER: negative. Sensory change: Gross sensation intact to all lumbar and sacral dermatomes.  Reflexes: 2+ at both patellar tendons, 2+ at achilles tendons, Babinski's downgoing.  Strength at foot  Plantar-flexion: 5/5 Dorsi-flexion: 5/5 Eversion: 5/5 Inversion: 5/5  Leg strength  Quad: 5/5 Hamstring: 5/5 Hip flexor: 5/5 Hip abductors: 4/5  Gait unremarkable.   Osteopathic findings C2 flexed rotated and side bent left C5 flexed rotated and side bent  right  Thoracic T4 extended rotated and side bent right T9 extended rotated and side bent right  Lumbar L2 flexed rotated and side bent right  Sacrum left on  left   Impression and Recommendations:     This case required medical decision making of moderate complexity.

## 2014-10-29 ENCOUNTER — Other Ambulatory Visit: Payer: Self-pay | Admitting: *Deleted

## 2014-10-29 DIAGNOSIS — M5416 Radiculopathy, lumbar region: Secondary | ICD-10-CM

## 2014-11-03 ENCOUNTER — Ambulatory Visit
Admission: RE | Admit: 2014-11-03 | Discharge: 2014-11-03 | Disposition: A | Discharge status: Home / Self Care | Payer: 59 | Source: Ambulatory Visit | Attending: Family Medicine | Admitting: Family Medicine

## 2014-11-03 DIAGNOSIS — M5416 Radiculopathy, lumbar region: Secondary | ICD-10-CM

## 2014-11-03 HISTORY — PX: LUMBAR EPIDURAL INJECTION: SHX1980

## 2014-11-03 MED ORDER — IOHEXOL 180 MG/ML  SOLN
1.0000 mL | Freq: Once | INTRAMUSCULAR | Status: DC | PRN
  Administered 2014-11-03: 1 mL via EPIDURAL

## 2014-11-03 MED ORDER — METHYLPREDNISOLONE ACETATE 40 MG/ML INJ SUSP (RADIOLOG
120.0000 mg | Freq: Once | INTRAMUSCULAR | Status: AC
  Administered 2014-11-03: 120 mg via EPIDURAL

## 2014-11-03 NOTE — Discharge Instructions (Signed)

## 2014-11-11 ENCOUNTER — Telehealth: Payer: Self-pay

## 2014-11-11 NOTE — Telephone Encounter (Signed)
Patient called requesting RX for hot flashes, Dr. Hulan Fray wants patient top be seen. Called patient, left message in an attempt to schedule an appointment

## 2014-11-14 ENCOUNTER — Ambulatory Visit: Payer: 59

## 2014-11-14 ENCOUNTER — Ambulatory Visit (INDEPENDENT_AMBULATORY_CARE_PROVIDER_SITE_OTHER): Payer: 59 | Admitting: Obstetrics & Gynecology

## 2014-11-14 ENCOUNTER — Encounter: Payer: Self-pay | Admitting: Obstetrics & Gynecology

## 2014-11-14 VITALS — BP 132/83 | HR 89 | Ht 61.5 in | Wt 146.0 lb

## 2014-11-14 DIAGNOSIS — N951 Menopausal and female climacteric states: Secondary | ICD-10-CM | POA: Diagnosis not present

## 2014-11-14 DIAGNOSIS — Z0000 Encounter for general adult medical examination without abnormal findings: Secondary | ICD-10-CM

## 2014-11-14 DIAGNOSIS — Z23 Encounter for immunization: Secondary | ICD-10-CM | POA: Diagnosis not present

## 2014-11-14 MED ORDER — MISOPROSTOL 200 MCG PO TABS: ORAL_TABLET | ORAL | Status: DC

## 2014-11-14 MED ORDER — NORETHINDRONE-ETH ESTRADIOL 1-5 MG-MCG PO TABS: 1.0000 | ORAL_TABLET | Freq: Every day | ORAL | Status: DC

## 2014-11-14 NOTE — Progress Notes (Signed)
   Subjective:    Patient ID: Tamara [name], female    DOB: 1960-06-19, 54 y.o.   MRN: [id number]  HPI  54 yo MW P0 here today with the issue of night sweats and a few daytime hot flashes since stopping the OCP in the middle of August. She stopped so that I could order a [place]. It was 27. She had a period at the beginning of August and none since.   Review of Systems     Objective:   Physical Exam [id number] Breathing, conversing, and ambulating normally       Assessment & Plan:  Symptomatic menopausal symptoms I offered watchful waiting versus HRT (like her sister in law uses and loves) versus restarting her low dose OCPs. She opts to try [facility] She is 100% aware that Fem Hrt is NOT birth control and that she is still potentially fertile. She would like to schedule Skyla insertion. I will pretreat with cytotec. Condoms until then.

## 2014-11-14 NOTE — Progress Notes (Signed)
Pt c/o moderate hot flashes at night for the las few weeks that wake her up often.

## 2014-11-17 ENCOUNTER — Ambulatory Visit (INDEPENDENT_AMBULATORY_CARE_PROVIDER_SITE_OTHER): Payer: 59 | Admitting: Family Medicine

## 2014-11-17 ENCOUNTER — Encounter: Payer: Self-pay | Admitting: Family Medicine

## 2014-11-17 VITALS — BP 126/82 | HR 106 | Wt 144.0 lb

## 2014-11-17 DIAGNOSIS — M9901 Segmental and somatic dysfunction of cervical region: Secondary | ICD-10-CM

## 2014-11-17 DIAGNOSIS — M9903 Segmental and somatic dysfunction of lumbar region: Secondary | ICD-10-CM | POA: Diagnosis not present

## 2014-11-17 DIAGNOSIS — M5416 Radiculopathy, lumbar region: Secondary | ICD-10-CM | POA: Diagnosis not present

## 2014-11-17 DIAGNOSIS — M9902 Segmental and somatic dysfunction of thoracic region: Secondary | ICD-10-CM

## 2014-11-17 DIAGNOSIS — M999 Biomechanical lesion, unspecified: Secondary | ICD-10-CM

## 2014-11-17 NOTE — Progress Notes (Signed)
Pre visit review using our clinic review tool, if applicable. No additional management support is needed unless otherwise documented below in the visit note. 

## 2014-11-17 NOTE — Patient Instructions (Addendum)
Good to see you Ice is your friend Watch the back and get back into the routine.  If worsening symptoms I would like to try the nerve block.  Wear good shoes Keep those hands clean ;) See me again in 4 weeks.

## 2014-11-17 NOTE — Assessment & Plan Note (Signed)
Lumbar radiculopathy significant improvement at this time. Did respond well to the epidural. Patient needs to we can repeat this or even try a nerve root block. We discussed icing regimen. Patient is known to increase her activity. Still responding well to osteopathic manipulation. Patient will come back and see me again in 4 weeks. Continue medications.

## 2014-11-17 NOTE — Progress Notes (Signed)
[name] [name], [place] [address] Phone: [phone] Subjective:    CC: Bilateral shoulder and neck pain follow up  [id number] LARISA Tamara [name] is a 54 y.o. female coming in with complaint of bilateral shoulder pain. Patient is also complaining of significant neck pain. Patient did have injections in the shoulders bilaterally and had improvements. 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  Doing well with the neck at this time. Patient though continues to have more of a low back pain. Patient had repeat MRI showing mild progressive disc degeneration and facet hypertrophy multiple levels with mild narrowing of the left lateral recess at L4-L5 and L5 S1.  Patient continued to have pain patient was sent for an of neural at L4-L5. Patient states she is 60-70% better. Patient states that she is doing yard work and being very active. States that the catching sensation is going come back the last couple days but significantly less than it was previously. Overall fairly happy with the results.    continues a low dose of the Effexor. No significant side effects.  Past medical history, social, surgical and family history all reviewed in electronic medical record.   Review of Systems: No headache, visual changes, nausea, vomiting, diarrhea, constipation, dizziness, abdominal pain, skin rash, fevers, chills, night sweats, weight loss, swollen lymph nodes, body aches, joint swelling, muscle aches, chest pain, shortness of breath, mood changes.   Objective Blood pressure 126/82, pulse 106, weight 144 lb (65.318 kg), last menstrual period 09/29/2014, SpO2 98 %.  General: No apparent distress alert and oriented x3 mood and affect normal, dressed appropriately.  HEENT: Pupils equal, extraocular movements intact tympanic membrane on the right ear does have some bulging as well as an air fluid level. Respiratory: Patient's speak in full sentences and  does not appear short of breath  Cardiovascular: No lower extremity edema, non tender, no erythema  Skin: Warm dry intact with no signs of infection or rash on extremities or on axial skeleton.  Abdomen: Soft nontender  Neuro: Cranial nerves II through XII are intact, neurovascularly intact in all extremities with 2+ DTRs and 2+ pulses.  Lymph: No lymphadenopathy of posterior or anterior cervical chain or axillae bilaterally.  Gait normal with good balance and coordination.  MSK:  Non tender with full range of motion and good stability and symmetric strength and tone of shoulders, elbows, wrist, hip, knee and ankles bilaterally.  Back examination patient has some mild levoscoliosis of the thoracic spine, no change Neck: Inspection unremarkable. No palpable stepoffs. Negative Spurling's  Lacking the last 5 in all range of motion.stable Grip strength and sensation normal in bilateral hands Strength good C4 to T1 distribution No sensory change to C4 to T1 Negative Hoffman sign bilaterally Reflexes normal Back Exam:  Inspection: Unremarkable  Motion: Flexion 45 deg, Extension 45 deg, Side Bending to 45 deg bilaterally,  Rotation to 45 deg bilaterally  SLR laying: Negative which is an improvement XSLR laying: Negative  Palpable tenderness: Less tender than previously.Marland Kitchen FABER: negative. Sensory change: Gross sensation intact to all lumbar and sacral dermatomes.  Reflexes: 2+ at both patellar tendons, 2+ at achilles tendons, Babinski's downgoing.  Strength at foot  Plantar-flexion: 5/5 Dorsi-flexion: 5/5 Eversion: 5/5 Inversion: 5/5  Leg strength  Quad: 5/5 Hamstring: 5/5 Hip flexor: 5/5 Hip abductors: 4/5  Gait unremarkable.   Osteopathic findings C2 flexed rotated and side bent left  Thoracic T4 extended rotated and side bent right  T7 extended rotated and side bent right  Lumbar L2 flexed rotated and side bent right  Sacrum left on left   Impression and Recommendations:      This case required medical decision making of moderate complexity.

## 2014-11-17 NOTE — Assessment & Plan Note (Signed)
Decision today to treat with OMT was based on Physical Exam  After verbal consent patient was treated with FPR and muscle energy techniques in cervical and thoracic and lumbar. areas  Patient tolerated the procedure well with improvement in symptoms  Patient given exercises, stretches and lifestyle modifications  See medications in patient instructions if given  Patient will follow up in 4 weeks                   

## 2014-11-24 ENCOUNTER — Encounter: Payer: Self-pay | Admitting: Obstetrics & Gynecology

## 2014-11-24 ENCOUNTER — Ambulatory Visit (INDEPENDENT_AMBULATORY_CARE_PROVIDER_SITE_OTHER): Payer: 59 | Admitting: Obstetrics & Gynecology

## 2014-11-24 VITALS — BP 148/92 | HR 96 | Wt 147.0 lb

## 2014-11-24 DIAGNOSIS — Z01812 Encounter for preprocedural laboratory examination: Secondary | ICD-10-CM

## 2014-11-24 DIAGNOSIS — Z30430 Encounter for insertion of intrauterine contraceptive device: Secondary | ICD-10-CM

## 2014-11-24 LAB — POCT URINE PREGNANCY: Preg Test, Ur: NEGATIVE

## 2014-11-24 NOTE — Progress Notes (Signed)
   Subjective:    Patient ID: Tamara [name], female    DOB: 08/31/1960, 54 y.o.   MRN: [id number]  HPI  54 yo perimenopausal lady here for [facility] insertion. She took cytotec last night. She has tried to [facility] for a week and is still having some hot flashes.  Review of Systems     Objective:   Physical Exam  UPT negative, consent signed, Time out procedure done. Cervix prepped with betadine and grasped with a single tooth tenaculum. Isla Pence was easily placed and the strings were cut to 3-4 cm. Uterus sounded to 8 cm. She tolerated the procedure well.        Assessment & Plan:  Contraception- Isla Pence

## 2014-11-25 ENCOUNTER — Encounter: Payer: Self-pay | Admitting: *Deleted

## 2014-12-09 ENCOUNTER — Ambulatory Visit (INDEPENDENT_AMBULATORY_CARE_PROVIDER_SITE_OTHER): Payer: 59 | Admitting: Family Medicine

## 2014-12-09 ENCOUNTER — Encounter: Payer: Self-pay | Admitting: Family Medicine

## 2014-12-09 ENCOUNTER — Telehealth: Payer: Self-pay | Admitting: Family Medicine

## 2014-12-09 DIAGNOSIS — M546 Pain in thoracic spine: Secondary | ICD-10-CM

## 2014-12-09 MED ORDER — ALBENDAZOLE 200 MG PO TABS: 400.0000 mg | ORAL_TABLET | Freq: Two times a day (BID) | ORAL | Status: DC
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
## 2014-12-09 NOTE — Patient Instructions (Signed)
Good to see you Ice 20 minutes 2 times daily. Usually after activity and before bed. Meloxicam daily for next week Muscle relaxer at night If worsen in 48 hours then consider prednisone Keep appointment on 31st in case

## 2014-12-09 NOTE — Progress Notes (Signed)
Pre visit review using our clinic review tool, if applicable. No additional management support is needed unless otherwise documented below in the visit note. 

## 2014-12-09 NOTE — Telephone Encounter (Signed)
Spoke to pt, scheduled appt for 330 today.
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
## 2014-12-15 ENCOUNTER — Encounter: Payer: Self-pay | Admitting: Family Medicine

## 2014-12-15 ENCOUNTER — Ambulatory Visit (INDEPENDENT_AMBULATORY_CARE_PROVIDER_SITE_OTHER): Payer: 59 | Admitting: Family Medicine

## 2014-12-15 DIAGNOSIS — M546 Pain in thoracic spine: Secondary | ICD-10-CM

## 2014-12-15 DIAGNOSIS — M542 Cervicalgia: Secondary | ICD-10-CM
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
## 2014-12-15 NOTE — Progress Notes (Signed)
Pre visit review using our clinic review tool, if applicable. No additional management support is needed unless otherwise documented below in the visit note. 
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
## 2014-12-19 ENCOUNTER — Ambulatory Visit
Admission: RE | Admit: 2014-12-19 | Discharge: 2014-12-19 | Disposition: A | Discharge status: Home / Self Care | Payer: 59 | Source: Ambulatory Visit | Attending: Obstetrics & Gynecology | Admitting: Obstetrics & Gynecology

## 2014-12-19 DIAGNOSIS — Z1231 Encounter for screening mammogram for malignant neoplasm of breast: Secondary | ICD-10-CM

## 2014-12-24 ENCOUNTER — Other Ambulatory Visit (HOSPITAL_COMMUNITY): Payer: Self-pay | Admitting: Obstetrics & Gynecology

## 2014-12-24 DIAGNOSIS — R928 Other abnormal and inconclusive findings on diagnostic imaging of breast: Secondary | ICD-10-CM

## 2014-12-30 ENCOUNTER — Ambulatory Visit
Admission: RE | Admit: 2014-12-30 | Discharge: 2014-12-30 | Disposition: A | Discharge status: Home / Self Care | Payer: 59 | Source: Ambulatory Visit | Attending: Obstetrics & Gynecology | Admitting: Obstetrics & Gynecology

## 2014-12-30 DIAGNOSIS — R928 Other abnormal and inconclusive findings on diagnostic imaging of breast: Secondary | ICD-10-CM

## 2015-01-12 ENCOUNTER — Ambulatory Visit (INDEPENDENT_AMBULATORY_CARE_PROVIDER_SITE_OTHER): Payer: 59 | Admitting: Family Medicine

## 2015-01-12 ENCOUNTER — Encounter: Payer: Self-pay | Admitting: Family Medicine

## 2015-01-12 VITALS — BP 138/90 | HR 109 | Ht 61.5 in | Wt 148.0 lb

## 2015-01-12 DIAGNOSIS — M9903 Segmental and somatic dysfunction of lumbar region: Secondary | ICD-10-CM

## 2015-01-12 DIAGNOSIS — M9901 Segmental and somatic dysfunction of cervical region: Secondary | ICD-10-CM

## 2015-01-12 DIAGNOSIS — M546 Pain in thoracic spine: Secondary | ICD-10-CM | POA: Diagnosis not present

## 2015-01-12 DIAGNOSIS — F32A Depression, unspecified: Secondary | ICD-10-CM

## 2015-01-12 DIAGNOSIS — M549 Dorsalgia, unspecified: Secondary | ICD-10-CM

## 2015-01-12 DIAGNOSIS — M9902 Segmental and somatic dysfunction of thoracic region: Secondary | ICD-10-CM

## 2015-01-12 DIAGNOSIS — F329 Major depressive disorder, single episode, unspecified: Secondary | ICD-10-CM

## 2015-01-12 DIAGNOSIS — M999 Biomechanical lesion, unspecified: Secondary | ICD-10-CM

## 2015-01-12 MED ORDER — VENLAFAXINE HCL 75 MG PO TABS: 75.0000 mg | ORAL_TABLET | Freq: Every day | ORAL | Status: DC

## 2015-01-12 NOTE — Assessment & Plan Note (Signed)
Patient is a little more tearful today. We will increase patient's Effexor back to 75 mg. I do not think that this was causing her weight gain. We will see how patient response. Patient is having significant amount of constriction on her house and this is caused her to come out of her routine. We discussed different diet choices to make at the restaurant. Patient come back and see me again in 3 weeks make sure she is responding to the higher dose.

## 2015-01-12 NOTE — Assessment & Plan Note (Signed)
Decision today to treat with OMT was based on Physical Exam  After verbal consent patient was treated with FPR and muscle energy techniques in cervical and thoracic and lumbar. areas  Patient tolerated the procedure well with improvement in symptoms  Patient given exercises, stretches and lifestyle modifications  See medications in patient instructions if given  Patient will follow up in 3-4 weeks.                                          
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
## 2015-01-12 NOTE — Assessment & Plan Note (Signed)
Pain between the shoulder blades. I do think that this is likely secondary to some stress. Patient will start doing her exercises more regularly. Patient is concerned about potential weight gain over the course last 2 years she has gained 10 pounds. I do think that this is more secondary to the depression. Patient will start doing this activity and like to see her again though in 3 weeks.

## 2015-01-12 NOTE — Progress Notes (Signed)
Pre visit review using our clinic review tool, if applicable. No additional management support is needed unless otherwise documented below in the visit note. 

## 2015-01-12 NOTE — Patient Instructions (Signed)
Good to see you A lot of salad but add lean protein .  Effexor up to 75mg  daily Stay active Back is great  Good luck with the colony see me again in 4i sh weeks.

## 2015-02-03 ENCOUNTER — Ambulatory Visit (INDEPENDENT_AMBULATORY_CARE_PROVIDER_SITE_OTHER): Payer: 59 | Admitting: Family Medicine

## 2015-02-03 VITALS — BP 112/86 | HR 86 | Temp 98.3°F | Resp 16 | Ht 61.5 in | Wt 150.0 lb

## 2015-02-03 DIAGNOSIS — J0100 Acute maxillary sinusitis, unspecified: Secondary | ICD-10-CM | POA: Diagnosis not present

## 2015-02-03 DIAGNOSIS — J209 Acute bronchitis, unspecified: Secondary | ICD-10-CM

## 2015-02-03 DIAGNOSIS — R05 Cough: Secondary | ICD-10-CM | POA: Diagnosis not present

## 2015-02-03 DIAGNOSIS — R059 Cough, unspecified: Secondary | ICD-10-CM

## 2015-02-03 MED ORDER — AMOXICILLIN-POT CLAVULANATE 875-125 MG PO TABS: 1.0000 | ORAL_TABLET | Freq: Two times a day (BID) | ORAL | Status: DC

## 2015-02-03 MED ORDER — BENZONATATE 100 MG PO CAPS: 200.0000 mg | ORAL_CAPSULE | Freq: Two times a day (BID) | ORAL | Status: DC | PRN

## 2015-02-03 NOTE — Patient Instructions (Signed)
Amoxicillin; Clavulanic Acid oral What is this medicine? AMOXICILLIN; CLAVULANIC ACID (a mox i SILL in; KLAV yoo lan ic AS id) is a penicillin antibiotic. It is used to treat certain kinds of bacterial infections. It will not work for colds, flu, or other viral infections. This medicine may be used for other purposes; ask your health care provider or pharmacist if you have questions. What should I tell my health care provider before I take this medicine? They need to know if you have any of these conditions: -bowel disease, like colitis -kidney disease -liver disease -mononucleosis -phenylketonuria -an unusual or allergic reaction to amoxicillin, penicillin, cephalosporin, other antibiotics, clavulanic acid, other medicines, foods, dyes, or preservatives -pregnant or trying to get pregnant -breast-feeding How should I use this medicine? Take this medicine by mouth just before a meal or snack. Follow the directions on the prescription label. Shake well before using. Use a specially marked spoon or container to measure your medicine. Ask your pharmacist if you do not have one. Household spoons are not accurate. Bottles of suspension may contain more liquid than you need to take. Follow your doctor's instructions about how much to take and for how many days to take it. Do not take more medicine than directed. But, finish all the medicine that is prescribed even if you think you are better. Talk to your pediatrician regarding the use of this medicine in children. While this drug may be prescribed for children as young as newborns for selected conditions, precautions do apply. Overdosage: If you think you have taken too much of this medicine contact a poison control center or emergency room at once. NOTE: This medicine is only for you. Do not share this medicine with others. What if I miss a dose? If you miss a dose, take it as soon as you can. If it is almost time for your next dose, take only that  dose. Do not take double or extra doses. What may interact with this medicine? -allopurinol -anticoagulants -birth control pills -methotrexate -probenecid This list may not describe all possible interactions. Give your health care provider a list of all the medicines, herbs, non-prescription drugs, or dietary supplements you use. Also tell them if you smoke, drink alcohol, or use illegal drugs. Some items may interact with your medicine. What should I watch for while using this medicine? Tell your doctor or health care professional if your symptoms do not improve. Do not treat diarrhea with over the counter products. Contact your doctor if you have diarrhea that lasts more than 2 days or if it is severe and watery. If you have diabetes, you may get a false-positive result for sugar in your urine. Check with your doctor or health care professional. Birth control pills may not work properly while you are taking this medicine. Talk to your doctor about using an extra method of birth control. What side effects may I notice from receiving this medicine? Side effects that you should report to your doctor or health care professional as soon as possible: -allergic reactions like skin rash, itching or hives, swelling of the face, lips, or tongue -breathing problems -dark urine -fever or chills, sore throat -redness, blistering, peeling or loosening of the skin, including inside the mouth -seizures -trouble passing urine or change in the amount of urine -unusual bleeding, bruising -unusually weak or tired -white patches or sores in the mouth or throat Side effects that usually do not require medical attention (report to your doctor or health care professional  if they continue or are bothersome): -diarrhea -dizziness -headache -nausea, vomiting -stomach upset -vaginal or anal irritation This list may not describe all possible side effects. Call your doctor for medical advice about side effects.  You may report side effects to FDA at 1-800-FDA-1088. Where should I keep my medicine? Keep out of the reach of children. After this medicine is mixed by your pharmacist, store it in a refrigerator. Do not freeze. Throw away any unused medicine after 10 days. NOTE: This sheet is a summary. It may not cover all possible information. If you have questions about this medicine, talk to your doctor, pharmacist, or health care provider.    2016, Elsevier/Gold Standard. (2012-06-27 12:08:42) Sinusitis, Adult Sinusitis is redness, soreness, and inflammation of the paranasal sinuses. Paranasal sinuses are air pockets within the bones of your face. They are located beneath your eyes, in the middle of your forehead, and above your eyes. In healthy paranasal sinuses, mucus is able to drain out, and air is able to circulate through them by way of your nose. However, when your paranasal sinuses are inflamed, mucus and air can become trapped. This can allow bacteria and other germs to grow and cause infection. Sinusitis can develop quickly and last only a short time (acute) or continue over a long period (chronic). Sinusitis that lasts for more than 12 weeks is considered chronic. CAUSES Causes of sinusitis include:  Allergies.  Structural abnormalities, such as displacement of the cartilage that separates your nostrils (deviated septum), which can decrease the air flow through your nose and sinuses and affect sinus drainage.  Functional abnormalities, such as when the small hairs (cilia) that line your sinuses and help remove mucus do not work properly or are not present. SIGNS AND SYMPTOMS Symptoms of acute and chronic sinusitis are the same. The primary symptoms are pain and pressure around the affected sinuses. Other symptoms include:  Upper toothache.  Earache.  Headache.  Bad breath.  Decreased sense of smell and taste.  A cough, which worsens when you are lying  flat.  Fatigue.  Fever.  Thick drainage from your nose, which often is green and may contain pus (purulent).  Swelling and warmth over the affected sinuses. DIAGNOSIS Your health care provider will perform a physical exam. During your exam, your health care provider may perform any of the following to help determine if you have acute sinusitis or chronic sinusitis:  Look in your nose for signs of abnormal growths in your nostrils (nasal polyps).  Tap over the affected sinus to check for signs of infection.  View the inside of your sinuses using an imaging device that has a light attached (endoscope). If your health care provider suspects that you have chronic sinusitis, one or more of the following tests may be recommended:  Allergy tests.  Nasal culture. A sample of mucus is taken from your nose, sent to a lab, and screened for bacteria.  Nasal cytology. A sample of mucus is taken from your nose and examined by your health care provider to determine if your sinusitis is related to an allergy. TREATMENT Most cases of acute sinusitis are related to a viral infection and will resolve on their own within 10 days. Sometimes, medicines are prescribed to help relieve symptoms of both acute and chronic sinusitis. These may include pain medicines, decongestants, nasal steroid sprays, or saline sprays. However, for sinusitis related to a bacterial infection, your health care provider will prescribe antibiotic medicines. These are medicines that will help kill  the bacteria causing the infection. Rarely, sinusitis is caused by a fungal infection. In these cases, your health care provider will prescribe antifungal medicine. For some cases of chronic sinusitis, surgery is needed. Generally, these are cases in which sinusitis recurs more than 3 times per year, despite other treatments. HOME CARE INSTRUCTIONS  Drink plenty of water. Water helps thin the mucus so your sinuses can drain more  easily.  Use a humidifier.  Inhale steam 3-4 times a day (for example, sit in the bathroom with the shower running).  Apply a warm, moist washcloth to your face 3-4 times a day, or as directed by your health care provider.  Use saline nasal sprays to help moisten and clean your sinuses.  Take medicines only as directed by your health care provider.  If you were prescribed either an antibiotic or antifungal medicine, finish it all even if you start to feel better. SEEK IMMEDIATE MEDICAL CARE IF:  You have increasing pain or severe headaches.  You have nausea, vomiting, or drowsiness.  You have swelling around your face.  You have vision problems.  You have a stiff neck.  You have difficulty breathing.   This information is not intended to replace advice given to you by your health care provider. Make sure you discuss any questions you have with your health care provider.   Document Released: 01/31/2005 Document Revised: 02/21/2014 Document Reviewed: 02/15/2011 Elsevier Interactive Patient Education [employer].

## 2015-02-03 NOTE — Progress Notes (Signed)
Chief Complaint:  Chief Complaint  Patient presents with  . Sinusitis    nasal and chest congestion, yellow mucus x 3 days  . Cough    x 3 days  . Dizziness    ear pain, pt fills like there is fluid draining down throat. x thursday    HPI: Tamara [name] is a 54 y.o. female who reports to [facility] today complaining of 3 day hisotry of feeling poorly , nasal congestion, cough, green yellow. No feversorc chills. Has allergies.  She has taken nasal spray in the past with no releif. She had dizziness several days before and some ear pressure . She is on HTN meds but thsi does nto happen with her HTN meds and associated with sore throat, eat pain.   Past Medical History  Diagnosis Date  . Depression   . Hypertension   . Allergic rhinitis, mild   . Anemia   . Ovarian cyst    Past Surgical History  Procedure Laterality Date  . Laminectomy    . Lumbar epidural injection  11/03/2014   Social History   Social History  . Marital Status: Married    Spouse Name: N/A  . Number of Children: N/A  . Years of Education: N/A   Social History Main Topics  . Smoking status: Never Smoker   . Smokeless tobacco: None  . Alcohol Use: 1.8 oz/week    3 Standard drinks or equivalent per week  . Drug Use: No  . Sexual Activity:    Partners: Male    Birth Control/ Protection: Pill   Other Topics Concern  . None   Social History Narrative   Family History  Problem Relation Age of Onset  . Depression Mother   . Depression Father   . Alcohol abuse Father    Allergies  Allergen Reactions  . Gabapentin Swelling   Prior to Admission medications   Medication Sig Start Date End Date Taking? Authorizing Provider  ALPRAZolam Duanne Moron) 0.5 MG tablet  04/01/13  Yes Historical Provider, MD  cetirizine (ZYRTEC) 10 MG tablet Take 10 mg by mouth daily.   Yes Historical Provider, MD  fluticasone (FLONASE) 50 MCG/ACT nasal spray Place 2 sprays into both nostrils daily. 09/19/14  Yes [name], DO  meloxicam (MOBIC) 15 MG tablet Take 1 tablet (15 mg total) by mouth daily as needed. 10/10/14  Yes [name], DO  valsartan (DIOVAN) 320 MG tablet Take 1 tablet (320 mg total) by mouth daily. 08/08/14  Yes [name], MD  venlafaxine (EFFEXOR) 75 MG tablet Take 1 tablet (75 mg total) by mouth daily. 01/12/15  Yes [name], DO  albendazole (ALBENZA) 200 MG tablet Take 2 tablets (400 mg total) by mouth 2 (two) times daily. Patient not taking: Reported on 02/03/2015 12/09/14   [name], DO  amoxicillin-clavulanate (AUGMENTIN) 875-125 MG tablet Take 1 tablet by mouth 2 (two) times daily. 02/03/15   Tondalaya Perren P Toma Arts, DO  benzonatate (TESSALON) 100 MG capsule Take 2 capsules (200 mg total) by mouth 2 (two) times daily as needed. 02/03/15   Rayvn Rickerson P Navil Kole, DO  hydrOXYzine (ATARAX/VISTARIL) 10 MG tablet Take 1 tablet (10 mg total) by mouth every 8 (eight) hours as needed. Patient not taking: Reported on 02/03/2015 08/22/14   [name], DO     ROS: The patient denies fevers, chills, night sweats, unintentional weight loss, chest pain, palpitations, wheezing, dyspnea on exertion, nausea, vomiting, abdominal pain,  dysuria, hematuria, melena, numbness, weakness, or tingling.  All other systems have been reviewed and were otherwise negative with the exception of those mentioned in the HPI and as above.    PHYSICAL EXAM: Filed Vitals:   02/03/15 1134  BP: 112/86  Pulse: 86  Temp: 98.3 F (36.8 C)  Resp: 16   Body mass index is 27.89 kg/(m^2).   General: Alert, no acute distress HEENT:  Normocephalic, atraumatic, oropharynx patent. EOMI, PERRLA Erythematous throat, no exudates, TM normal, + sinus tenderness, + erythematous/boggy nasal mucosa Cardiovascular:  Regular rate and rhythm, no rubs murmurs or gallops.  No Carotid bruits, radial pulse intact. No pedal edema.  Respiratory: Clear to auscultation bilaterally.  No wheezes, rales, or rhonchi.  No cyanosis, no use of  accessory musculature Abdominal: No organomegaly, abdomen is soft and non-tender, positive bowel sounds. No masses. Skin: No rashes. Neurologic: Facial musculature symmetric. Psychiatric: Patient acts appropriately throughout our interaction. Lymphatic: No cervical or submandibular lymphadenopathy Musculoskeletal: Gait intact. No edema, tenderness   LABS: Results for orders placed or performed in visit on 11/24/14  POCT urine pregnancy  Result Value Ref Range   Preg Test, Ur Negative Negative     EKG/XRAY:   Primary read interpreted by Dr. Marin Comment at [facility].   ASSESSMENT/PLAN: Encounter Diagnoses  Name Primary?  . Cough Yes  . Acute maxillary sinusitis, recurrence not specified   . Acute bronchitis, unspecified organism    Rx augmentin, tessalon perels Cont with zrtec prn for allergies Fu prn   Gross sideeffects, risk and benefits, and alternatives of medications d/w patient. Patient is aware that all medications have potential sideeffects and we are unable to predict every sideeffect or drug-drug interaction that may occur.  Itza Maniaci DO  02/03/2015 1:43 PM

## 2015-02-06 ENCOUNTER — Ambulatory Visit: Payer: 59 | Admitting: Internal Medicine

## 2015-02-06 ENCOUNTER — Ambulatory Visit: Payer: 59 | Admitting: Family Medicine

## 2015-02-13 ENCOUNTER — Encounter: Payer: Self-pay | Admitting: Family Medicine

## 2015-02-13 ENCOUNTER — Ambulatory Visit (INDEPENDENT_AMBULATORY_CARE_PROVIDER_SITE_OTHER): Payer: 59 | Admitting: Family Medicine

## 2015-02-13 VITALS — BP 118/84 | HR 113 | Ht 61.5 in | Wt 146.0 lb

## 2015-02-13 DIAGNOSIS — D721 Eosinophilia, unspecified: Secondary | ICD-10-CM

## 2015-02-13 DIAGNOSIS — M5416 Radiculopathy, lumbar region: Secondary | ICD-10-CM | POA: Diagnosis not present

## 2015-02-13 DIAGNOSIS — K921 Melena: Secondary | ICD-10-CM

## 2015-02-13 DIAGNOSIS — M9903 Segmental and somatic dysfunction of lumbar region: Secondary | ICD-10-CM | POA: Diagnosis not present

## 2015-02-13 DIAGNOSIS — M48061 Spinal stenosis, lumbar region without neurogenic claudication: Secondary | ICD-10-CM

## 2015-02-13 DIAGNOSIS — M999 Biomechanical lesion, unspecified: Secondary | ICD-10-CM

## 2015-02-13 DIAGNOSIS — M9902 Segmental and somatic dysfunction of thoracic region: Secondary | ICD-10-CM | POA: Diagnosis not present

## 2015-02-13 DIAGNOSIS — M9901 Segmental and somatic dysfunction of cervical region: Secondary | ICD-10-CM

## 2015-02-13 DIAGNOSIS — M4722 Other spondylosis with radiculopathy, cervical region: Secondary | ICD-10-CM

## 2015-02-13 DIAGNOSIS — M4806 Spinal stenosis, lumbar region: Secondary | ICD-10-CM | POA: Diagnosis not present

## 2015-02-13 NOTE — Assessment & Plan Note (Signed)
Overall seems to be doing relatively well. No radicular symptoms at this time. I do feel that patient has made improvement since we've been treating her anxiety. Continues respond well to manipulation. Continued postural changes.

## 2015-02-13 NOTE — Progress Notes (Signed)
[name] [place], Tam[place] [name] [address] Phone: [phone] Subjective:     CC: Neck pain and back pain follow-up  [id number] Tamara [name] is a 54 y.o. female coming in with complaint of back and neck pain.  Overall has been relatively better. Patient has been doing some exercises as well as taking the medications much more regularly. [facility] higher dose of the Effexor. Feels like it has helped her anxiety as well.  Patient was also found to have an eosinophilia. There is concern for possible parasitic infection with patient having a job working with animals. Patient has been treated with antiparasitic medications onto times. Each time she has made some improvement. This time though she is having some bright red blood per rectum. Denies any constipation and states that there is more of a watery diarrhea. Denies any abdominal pain with it. No loss of weight or fevers. Patient is 54 years old and has not had a colonoscopy..  Past Medical History  Diagnosis Date  . Depression   . Hypertension   . Allergic rhinitis, mild   . Anemia   . Ovarian cyst    Past Surgical History  Procedure Laterality Date  . Laminectomy    . Lumbar epidural injection  11/03/2014   Social History  Substance Use Topics  . Smoking status: Never Smoker   . Smokeless tobacco: None  . Alcohol Use: 1.8 oz/week    3 Standard drinks or equivalent per week   Allergies  Allergen Reactions  . Gabapentin Swelling   Family History  Problem Relation Age of Onset  . Depression Mother   . Depression Father   . Alcohol abuse Father         Past medical history, social, surgical and family history all reviewed in electronic medical record.   Review of Systems: No headache, visual changes, nausea, vomiting, diarrhea, constipation, dizziness, abdominal pain, skin rash, fevers, chills, night sweats, weight loss, swollen lymph nodes, body aches, joint swelling,  muscle aches, chest pain, shortness of breath, mood changes.   Objective Blood pressure 118/84, pulse 113, height 5' 1.5" (1.562 m), weight 146 lb (66.225 kg), last menstrual period 01/31/2015, SpO2 94 %.  General: No apparent distress alert and oriented x3 mood and affect normal, patient is a little more tearful today HEENT: Pupils equal, extraocular movements intact  Respiratory: Patient's speak in full sentences and does not appear short of breath  Cardiovascular: No lower extremity edema, non tender, no erythema  Skin: Warm dry intact with no signs of infection or rash on extremities or on axial skeleton. Patient does have a chemical burn on her right hand over the fourth knuckle. Appears to be like an abrasion of this time but no openings or any signs of infection. Neurovascular intact distally. Abdomen: Soft nontender bowel sounds positive in all 4 quadrants. No pain to percussion or palpation. No masses appreciated. Neuro: Cranial nerves II through XII are intact, neurovascularly intact in all extremities with 2+ DTRs and 2+ pulses.  Lymph: No lymphadenopathy of posterior or anterior cervical chain or axillae bilaterally.  Gait normal with good balance and coordination.  MSK:  Non tender with full range of motion and good stability and symmetric strength and tone of shoulders, elbows, wrist, hip, knee and ankles bilaterally.   Patient deferred rectal exam  Neck: Inspection unremarkable. No palpable stepoffs. Negative Spurling's maneuver. Mild limitation lacking the last 5 of extension and 5 of flexion, side bending  and rotation bilaterally Grip strength and sensation normal in bilateral hands Strength good C4 to T1 distribution No sensory change to C4 to T1 Negative Hoffman sign bilaterally Reflexes normal Back Exam:  Inspection: Unremarkable  Motion: Flexion 45 deg, Extension 25 deg with significant decrease in tenderness, Side Bending to 45 deg bilaterally,  Rotation to 45  deg bilaterally  SLR laying: Negative  XSLR laying: Negative  Palpable tenderness: Significant less tenderness to palpation of the parastomal musculature. FABER: negative. Sensory change: Gross sensation intact to all lumbar and sacral dermatomes.  Reflexes: 2+ at both patellar tendons, 2+ at achilles tendons, Babinski's downgoing.  Strength at foot  Plantar-flexion: 5/5 Dorsi-flexion: 5/5 Eversion: 5/5 Inversion: 5/5  Leg strength  Quad: 5/5 Hamstring: 5/5 Hip flexor: 5/5 Hip abductors: 5/5  Gait unremarkable.  Osteopathic findings Osteopathic findings C2 flexed rotated and side bent left  Thoracic T4 extended rotated and side bent right T7 extended rotated and side bent right  Lumbar L2 flexed rotated and side bent right  Sacrum left on left   Impression and Recommendations:     This case required medical decision making of moderate complexity.

## 2015-02-13 NOTE — Assessment & Plan Note (Signed)
Preferred to gastroenterology with patient continuing to have more of a chronic diarrhea, now bright red blood per rectum and needing a colonoscopy for surveillance. Patient has no systemic signs of illnesses at this time. We'll wait to treat again and see we can have any confirmed diagnosis of parasitic infection before getting any more antibiotic. Patient will call if any fever occurs.

## 2015-02-13 NOTE — Assessment & Plan Note (Signed)
Decision today to treat with OMT was based on Physical Exam  After verbal consent patient was treated with FPR and muscle energy techniques in cervical and thoracic and lumbar. areas  Patient tolerated the procedure well with improvement in symptoms  Patient given exercises, stretches and lifestyle modifications  See medications in patient instructions if given  Patient will follow up in 4-6 weeks.

## 2015-02-13 NOTE — Progress Notes (Signed)
Pre visit review using our clinic review tool, if applicable. No additional management support is needed unless otherwise documented below in the visit note. 

## 2015-02-13 NOTE — Assessment & Plan Note (Signed)
Stable

## 2015-02-13 NOTE — Patient Instructions (Addendum)
Good to see you Happy New Year! Ice is your friend GI will be calling you See you again in 6 weeks.

## 2015-02-17 ENCOUNTER — Ambulatory Visit (INDEPENDENT_AMBULATORY_CARE_PROVIDER_SITE_OTHER): Payer: 59 | Admitting: Gastroenterology

## 2015-02-17 ENCOUNTER — Other Ambulatory Visit (INDEPENDENT_AMBULATORY_CARE_PROVIDER_SITE_OTHER): Payer: 59

## 2015-02-17 ENCOUNTER — Encounter: Payer: Self-pay | Admitting: Gastroenterology

## 2015-02-17 VITALS — BP 112/80 | HR 72 | Ht 61.0 in | Wt 146.0 lb

## 2015-02-17 DIAGNOSIS — K921 Melena: Secondary | ICD-10-CM

## 2015-02-17 DIAGNOSIS — A09 Infectious gastroenteritis and colitis, unspecified: Secondary | ICD-10-CM

## 2015-02-17 DIAGNOSIS — R197 Diarrhea, unspecified: Secondary | ICD-10-CM

## 2015-02-17 LAB — CBC WITH DIFFERENTIAL/PLATELET
BASOS PCT: 0.5 % (ref 0.0–3.0)
Basophils Absolute: 0 10*3/uL (ref 0.0–0.1)
EOS ABS: 0.4 10*3/uL (ref 0.0–0.7)
Eosinophils Relative: 5.6 % — ABNORMAL HIGH (ref 0.0–5.0)
HCT: 41.7 % (ref 36.0–46.0)
Hemoglobin: 13.9 g/dL (ref 12.0–15.0)
Lymphocytes Relative: 29.5 % (ref 12.0–46.0)
Lymphs Abs: 1.9 10*3/uL (ref 0.7–4.0)
MCHC: 33.4 g/dL (ref 30.0–36.0)
MCV: 97.6 fl (ref 78.0–100.0)
MONO ABS: 0.6 10*3/uL (ref 0.1–1.0)
Monocytes Relative: 8.5 % (ref 3.0–12.0)
NEUTROS ABS: 3.6 10*3/uL (ref 1.4–7.7)
NEUTROS PCT: 55.9 % (ref 43.0–77.0)
PLATELETS: 292 10*3/uL (ref 150.0–400.0)
RBC: 4.27 Mil/uL (ref 3.87–5.11)
RDW: 12.1 % (ref 11.5–15.5)
WBC: 6.5 10*3/uL (ref 4.0–10.5)

## 2015-02-17 LAB — COMPREHENSIVE METABOLIC PANEL
ALT: 36 U/L — ABNORMAL HIGH (ref 0–35)
AST: 30 U/L (ref 0–37)
Albumin: 4.2 g/dL (ref 3.5–5.2)
Alkaline Phosphatase: 59 U/L (ref 39–117)
BUN: 10 mg/dL (ref 6–23)
CHLORIDE: 105 meq/L (ref 96–112)
CO2: 27 meq/L (ref 19–32)
CREATININE: 0.62 mg/dL (ref 0.40–1.20)
Calcium: 9.3 mg/dL (ref 8.4–10.5)
GFR: 107.37 mL/min (ref >60.00)
Glucose, Bld: 81 mg/dL (ref 70–99)
Potassium: 4 mEq/L (ref 3.5–5.1)
SODIUM: 140 meq/L (ref 135–145)
Total Bilirubin: 0.5 mg/dL (ref 0.2–1.2)
Total Protein: 6.6 g/dL (ref 6.0–8.3)

## 2015-02-17 MED ORDER — NA SULFATE-K SULFATE-MG SULF 17.5-3.13-1.6 GM/177ML PO SOLN: 1.0000 | Freq: Once | ORAL | Status: DC

## 2015-02-17 NOTE — Progress Notes (Signed)
    History of Present Illness: This is a 55 year old female referred by [name], MD for the evaluation of diarrhea and small volume rectal bleeding. Patient works in a veterinarian's office and states she developed diarrhea after being exposed to kittens with roundworm. She was empirically treated with two 1 month courses of albendazole and each time her diarrhea resolved and returned. She was also treated with a 10 day course of Augmentin for bronchitis and sinusitis in late December. She had an exacerbation of diarrhea while taking Augmentin and since discontinuing it diarrhea has improved. She now has had one loose stool every other day alternating with formed stool on the other days. Over the past week she has noted small amounts of bright red blood per rectum with bowel movements. She has not previously had colonoscopy. Denies weight loss, abdominal pain, constipation, change in stool caliber, melena, nausea, vomiting, dysphagia, reflux symptoms, chest pain.  Review of Systems: Pertinent positive and negative review of systems were noted in the above HPI section. All other review of systems were otherwise negative.  Current Medications, Allergies, Past Medical History, Past Surgical History, Family History and Social History were reviewed in [employer] record.  Physical Exam: General: Well developed, well nourished, no acute distress Head: Normocephalic and atraumatic Eyes:  sclerae anicteric, EOMI Ears: Normal auditory acuity Mouth: No deformity or lesions Neck: Supple, no masses or thyromegaly Lungs: Clear throughout to auscultation Heart: Regular rate and rhythm; no murmurs, rubs or bruits Abdomen: Soft, non tender and non distended. No masses, hepatosplenomegaly or hernias noted. Normal Bowel sounds Rectal: Deferred to colonoscopy Musculoskeletal: Symmetrical with no gross deformities  Skin: No lesions on visible extremities Pulses:  Normal pulses  noted Extremities: No clubbing, cyanosis, edema or deformities noted Neurological: Alert oriented x 4, grossly nonfocal Cervical Nodes:  No significant cervical adenopathy Inguinal Nodes: No significant inguinal adenopathy Psychological:  Alert and cooperative. Normal mood and affect  Assessment and Recommendations:  1. Diarrhea and hematochezia. Suspected roundworm infection was treated empirically with 2 courses of albendazole. Possible antibiotic associated diarrhea with Augmentin useage. Rule out C. Difficile and other infectious diarrhea. Could simply be having postantibiotic diarrhea. Obtain a GI pathogen panel and O&P. Rule out colitis, hemorrhoids and colorectal neoplasms. Schedule colonoscopy. The risks (including bleeding, perforation, infection, missed lesions, medication reactions and possible hospitalization or surgery if complications occur), benefits, and alternatives to colonoscopy with possible biopsy and possible polypectomy were discussed with the patient and they consent to proceed.   2. Eosinophilia in 07/2014. Recheck CBC with diff and check CMP.   cc: [name], MD [place], [place] [address]

## 2015-02-17 NOTE — Patient Instructions (Signed)
Your physician has requested that you go to the basement for the following lab work before leaving today:CBC, CMP, GI pathogen panel, Ova and parasite.  You have been scheduled for a colonoscopy. Please follow written instructions given to you at your visit today.  Please pick up your prep supplies at the pharmacy within the next 1-3 days. If you use inhalers (even only as needed), please bring them with you on the day of your procedure. Your physician has requested that you go to www.startemmi.com and enter the access code given to you at your visit today. This web site gives a general overview about your procedure. However, you should still follow specific instructions given to you by our office regarding your preparation for the procedure.  Thank you for choosing me and [facility].  Pricilla Riffle. Dagoberto Ligas., MD., [facility]

## 2015-02-18 ENCOUNTER — Other Ambulatory Visit: Payer: 59

## 2015-02-18 ENCOUNTER — Other Ambulatory Visit: Payer: Self-pay

## 2015-02-18 DIAGNOSIS — K921 Melena: Secondary | ICD-10-CM

## 2015-02-18 DIAGNOSIS — R7989 Other specified abnormal findings of blood chemistry: Secondary | ICD-10-CM

## 2015-02-18 DIAGNOSIS — R945 Abnormal results of liver function studies: Principal | ICD-10-CM

## 2015-02-18 DIAGNOSIS — R197 Diarrhea, unspecified: Secondary | ICD-10-CM

## 2015-02-19 LAB — GASTROINTESTINAL PATHOGEN PANEL PCR
C. difficile Tox A/B, PCR: NEGATIVE
CAMPYLOBACTER, PCR: NEGATIVE
Cryptosporidium, PCR: NEGATIVE
E coli (ETEC) LT/ST PCR: NEGATIVE
E coli (STEC) stx1/stx2, PCR: NEGATIVE
E coli 0157, PCR: NEGATIVE
GIARDIA LAMBLIA, PCR: NEGATIVE
NOROVIRUS, PCR: NEGATIVE
ROTAVIRUS, PCR: NEGATIVE
SALMONELLA, PCR: NEGATIVE
SHIGELLA, PCR: NEGATIVE

## 2015-02-19 LAB — OVA AND PARASITE EXAMINATION: OP: NONE SEEN

## 2015-02-23 ENCOUNTER — Encounter: Payer: 59 | Admitting: Gastroenterology

## 2015-02-25 ENCOUNTER — Encounter: Payer: Self-pay | Admitting: Gastroenterology

## 2015-02-25 ENCOUNTER — Ambulatory Visit (AMBULATORY_SURGERY_CENTER): Payer: 59 | Admitting: Gastroenterology

## 2015-02-25 VITALS — BP 124/85 | HR 82 | Temp 96.5°F | Resp 27 | Ht 61.0 in | Wt 146.0 lb

## 2015-02-25 DIAGNOSIS — R197 Diarrhea, unspecified: Secondary | ICD-10-CM | POA: Diagnosis not present

## 2015-02-25 DIAGNOSIS — K921 Melena: Secondary | ICD-10-CM

## 2015-02-25 DIAGNOSIS — A09 Infectious gastroenteritis and colitis, unspecified: Secondary | ICD-10-CM

## 2015-02-25 MED ORDER — SODIUM CHLORIDE 0.9 % IV SOLN: 500.0000 mL | INTRAVENOUS | Status: DC

## 2015-02-25 NOTE — Patient Instructions (Signed)
Discharge instructions given. Biopsies taken. Handouts on diverticulosis and hemorrhoids. Resume previous medications. YOU HAD AN ENDOSCOPIC PROCEDURE TODAY AT [facility]:   Refer to the procedure report that was given to you for any specific questions about what was found during the examination.  If the procedure report does not answer your questions, please call your gastroenterologist to clarify.  If you requested that your care partner not be given the details of your procedure findings, then the procedure report has been included in a sealed envelope for you to review at your convenience later.  YOU SHOULD EXPECT: Some feelings of bloating in the abdomen. Passage of more gas than usual.  Walking can help get rid of the air that was put into your GI tract during the procedure and reduce the bloating. If you had a lower endoscopy (such as a colonoscopy or flexible sigmoidoscopy) you may notice spotting of blood in your stool or on the toilet paper. If you underwent a bowel prep for your procedure, you may not have a normal bowel movement for a few days.  Please Note:  You might notice some irritation and congestion in your nose or some drainage.  This is from the oxygen used during your procedure.  There is no need for concern and it should clear up in a day or so.  SYMPTOMS TO REPORT IMMEDIATELY:   Following lower endoscopy (colonoscopy or flexible sigmoidoscopy):  Excessive amounts of blood in the stool  Significant tenderness or worsening of abdominal pains  Swelling of the abdomen that is new, acute  Fever of 100F or higher  For urgent or emergent issues, a gastroenterologist can be reached at any hour by calling [phone].   DIET: Your first meal following the procedure should be a small meal and then it is ok to progress to your normal diet. Heavy or fried foods are harder to digest and may make you feel nauseous or bloated.  Likewise, meals heavy in dairy and  vegetables can increase bloating.  Drink plenty of fluids but you should avoid alcoholic beverages for 24 hours.  ACTIVITY:  You should plan to take it easy for the rest of today and you should NOT DRIVE or use heavy machinery until tomorrow (because of the sedation medicines used during the test).    FOLLOW UP: Our staff will call the number listed on your records the next business day following your procedure to check on you and address any questions or concerns that you may have regarding the information given to you following your procedure. If we do not reach you, we will leave a message.  However, if you are feeling well and you are not experiencing any problems, there is no need to return our call.  We will assume that you have returned to your regular daily activities without incident.  If any biopsies were taken you will be contacted by phone or by letter within the next 1-3 weeks.  Please call us at [phone] if you have not heard about the biopsies in 3 weeks.    SIGNATURES/CONFIDENTIALITY: You and/or your care partner have signed paperwork which will be entered into your electronic medical record.  These signatures attest to the fact that that the information above on your After Visit Summary has been reviewed and is understood.  Full responsibility of the confidentiality of this discharge information lies with you and/or your care-partner.

## 2015-02-25 NOTE — Op Note (Signed)
[place]  [address] [place], [address]   COLONOSCOPY PROCEDURE REPORT  PATIENT: Tamara [name], Tamara [name]  MR#: [id number] BIRTHDATE: Aug 09, 1960 , 54  yrs. old GENDER: female ENDOSCOPIST: Ladene Artist, MD, [facility] REFERRED BY:  Cathlean Cower, M.D. PROCEDURE DATE:  02/25/2015 PROCEDURE:   Colonoscopy, diagnostic and Colonoscopy with biopsy First Screening Colonoscopy - Avg.  risk and is 50 yrs.  old or older - No.  Prior Negative Screening - Now for repeat screening. N/A  History of Adenoma - Now for follow-up colonoscopy & has been > or = to 3 yrs.  N/A  Polyps removed today? No Recommend repeat exam, <10 yrs? No ASA CLASS:   Class II INDICATIONS:Evaluation of unexplained GI bleeding and Clinically significant diarrhea of unexplained origin. MEDICATIONS: Monitored anesthesia care and Propofol 400 mg IV DESCRIPTION OF PROCEDURE:   After the risks benefits and alternatives of the procedure were thoroughly explained, informed consent was obtained.  The digital rectal exam revealed no abnormalities of the rectum.   The LB [id number]  endoscope was introduced through the anus and advanced to the cecum, which was identified by both the appendix and ileocecal valve. No adverse events experienced.   The quality of the prep was excellent. (Suprep was used)  The instrument was then slowly withdrawn as the colon was fully examined. Estimated blood loss is zero unless otherwise noted in this procedure report.    COLON FINDINGS: There was mild diverticulosis noted in the descending colon.   The colonic mucosa appeared normal at the splenic flexure, in the transverse colon, rectum, sigmoid colon, at the ileocecal valve, cecum, hepatic flexure, and in the ascending colon.  Multiple random biopsies were performed.   The examined terminal ileum appeared to be normal.  Retroflexed views revealed internal Grade I hemorrhoids. The time to cecum = 3.2 Withdrawal time = 10.7    The scope was withdrawn and the procedure completed. COMPLICATIONS: There were no immediate complications.  ENDOSCOPIC IMPRESSION: 1.   Mild diverticulosis in the descending colon 2.   The colonic mucosa otherwise appeared normal; multiple random biopsies performed 3.   The examined terminal ileum appeared to be normal 4.   Grade I internal hemorrhoids  RECOMMENDATIONS: 1.  Await pathology results 2.  High fiber diet with liberal fluid intake. 3.  Continue current colorectal screening recommendations for "routine risk" patients with a repeat colonoscopy in 10 years.  eSigned:  Ladene Artist, MD, [facility] 02/25/2015 11:21 AM

## 2015-02-25 NOTE — Progress Notes (Signed)
Patient awakening,vss,report to rn 

## 2015-02-25 NOTE — Progress Notes (Signed)
Called to room to assist during endoscopic procedure.  Patient ID and intended procedure confirmed with present staff. Received instructions for my participation in the procedure from the performing physician.  

## 2015-02-26 ENCOUNTER — Telehealth: Payer: Self-pay | Admitting: *Deleted

## 2015-02-26 NOTE — Telephone Encounter (Signed)
Message left

## 2015-03-03 ENCOUNTER — Encounter: Payer: Self-pay | Admitting: Gastroenterology

## 2015-03-16 ENCOUNTER — Encounter: Payer: Self-pay | Admitting: Family Medicine

## 2015-03-16 ENCOUNTER — Ambulatory Visit (INDEPENDENT_AMBULATORY_CARE_PROVIDER_SITE_OTHER): Payer: 59 | Admitting: Family Medicine

## 2015-03-16 ENCOUNTER — Other Ambulatory Visit: Payer: 59

## 2015-03-16 VITALS — BP 130/82 | HR 121 | Ht 61.0 in | Wt 147.0 lb

## 2015-03-16 DIAGNOSIS — M25511 Pain in right shoulder: Secondary | ICD-10-CM

## 2015-03-16 DIAGNOSIS — M7550 Bursitis of unspecified shoulder: Secondary | ICD-10-CM

## 2015-03-16 DIAGNOSIS — M25512 Pain in left shoulder: Principal | ICD-10-CM

## 2015-03-16 NOTE — Progress Notes (Signed)
Pre visit review using our clinic review tool, if applicable. No additional management support is needed unless otherwise documented below in the visit note. 

## 2015-03-16 NOTE — Progress Notes (Signed)
[name] [place], [place] [address] Phone: [phone] Subjective:    CC: Bilateral shoulder and neck pain follow up  [id number] Tamara [name] is a 55 y.o. female coming in with complaint of bilateral shoulder pain. Patient is also complaining of significant neck pain. Patient did have injections in the shoulders bilaterally worsening pain at this time. Did have injections long time ago and was doing much better. Patient states that now starting to affect her again. Waking her up at night. Discuss the pain as a dull, throbbing aching sensation. Sometimes certain movement gives her difficulty as well  Patient though continues to have more of a low back pain. Patient did have repeat x-rays. Patient show some slight progression from 8 2012 MRI of the degenerative disc disease at multiple levels. Patient states of low back seems to be doing fairly well. No significant increase in pain.      anxiety and depression controlled the low dose effexor   Past medical history, social, surgical and family history all reviewed in electronic medical record.   Review of Systems: No headache, visual changes, nausea, vomiting, diarrhea, constipation, dizziness, abdominal pain, skin rash, fevers, chills, night sweats, weight loss, swollen lymph nodes, body aches, joint swelling, muscle aches, chest pain, shortness of breath, mood changes.   Objective Blood pressure 130/82, pulse 121, height 5\' 1"  (1.549 m), weight 147 lb (66.679 kg), SpO2 94 %.  General: No apparent distress alert and oriented x3 mood and affect normal, dressed appropriately.  HEENT: Pupils equal, extraocular movements intact tympanic membrane on the right ear does have some bulging as well as an air fluid level. Respiratory: Patient's speak in full sentences and does not appear short of breath  Cardiovascular: No lower extremity edema, non tender, no erythema  Skin: Warm dry intact with  no signs of infection or rash on extremities or on axial skeleton.  Abdomen: Soft nontender  Neuro: Cranial nerves II through XII are intact, neurovascularly intact in all extremities with 2+ DTRs and 2+ pulses.  Lymph: No lymphadenopathy of posterior or anterior cervical chain or axillae bilaterally.  Gait normal with good balance and coordination.  MSK:  Non tender with full range of motion and good stability and symmetric strength and tone of shoulders, elbows, wrist, hip, knee and ankles bilaterally.  Back examination patient has some mild levoscoliosis of the thoracic spine, no change Neck: Inspection unremarkable. No palpable stepoffs. Negative Spurling's  Lacking the last 5 in all range of motion.stable Grip strength and sensation normal in bilateral hands Strength good C4 to T1 distribution No sensory change to C4 to T1 Negative Hoffman sign bilaterally Reflexes normal Positive impingement of the shoulders bilaterally. Gait is empty can sign. Neuro vascular intact with 5 out of 5 strength.  Back Exam:  Inspection: Unremarkable  Motion: Flexion 45 deg, Extension 45 deg, Side Bending to 45 deg bilaterally,  Rotation to 45 deg bilaterally  SLR laying: Positive left side XSLR laying: Negative  Palpable tenderness: Increased stiffness over the mid thoracic and upper thoracic spine. FABER: negative. Sensory change: Gross sensation intact to all lumbar and sacral dermatomes.  Reflexes: 2+ at both patellar tendons, 2+ at achilles tendons, Babinski's downgoing.  Strength at foot  Plantar-flexion: 5/5 Dorsi-flexion: 5/5 Eversion: 5/5 Inversion: 5/5  Leg strength  Quad: 5/5 Hamstring: 5/5 Hip flexor: 5/5 Hip abductors: 5/5  Gait unremarkable.    Procedure: Real-time Ultrasound Guided Injection of right glenohumeral joint Device: GE Logiq  E  Ultrasound guided injection is preferred based studies that show increased duration, increased effect, greater accuracy, decreased procedural  pain, increased response rate with ultrasound guided versus blind injection.  Verbal informed consent obtained.  Time-out conducted.  Noted no overlying erythema, induration, or other signs of local infection.  Skin prepped in a sterile fashion.  Local anesthesia: Topical Ethyl chloride.  With sterile technique and under real time ultrasound guidance:  Joint visualized.  23g 1  inch needle inserted posterior approach. Pictures taken for needle placement. Patient did have injection of 2 cc of 1% lidocaine, 2 cc of 0.5% Marcaine, and 1.0 cc of Kenalog 40 mg/dL. Completed without difficulty  Pain immediately resolved suggesting accurate placement of the medication.  Advised to call if fevers/chills, erythema, induration, drainage, or persistent bleeding.  Images permanently stored and available for review in the ultrasound unit.  Impression: Technically successful ultrasound guided injection.   Procedure: Real-time Ultrasound Guided Injection of left glenohumeral joint Device: GE Logiq E  Ultrasound guided injection is preferred based studies that show increased duration, increased effect, greater accuracy, decreased procedural pain, increased response rate with ultrasound guided versus blind injection.  Verbal informed consent obtained.  Time-out conducted.  Noted no overlying erythema, induration, or other signs of local infection.  Skin prepped in a sterile fashion.  Local anesthesia: Topical Ethyl chloride.  With sterile technique and under real time ultrasound guidance:  Joint visualized.  23g 1  inch needle inserted posterior approach. Pictures taken for needle placement. Patient did have injection of 2 cc of 1% lidocaine, 2 cc of 0.5% Marcaine, and 1cc of Kenalog 40 mg/dL. Completed without difficulty  Pain immediately resolved suggesting accurate placement of the medication.  Advised to call if fevers/chills, erythema, induration, drainage, or persistent bleeding.  Images permanently  stored and available for review in the ultrasound unit.  Impression: Technically successful ultrasound guided injection.    Impression and Recommendations:     This case required medical decision making of moderate complexity.

## 2015-03-16 NOTE — Assessment & Plan Note (Signed)
She was given bilateral injections again today. Patient tolerated the procedure well. We discussed icing regimen and home exercises. We discussed which activities to do an which ones to avoid. 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 Patient will come back and see me again in 4 weeks for further evaluation. At that time we will restart with osteopathic manipulation. Spent  25 minutes with patient face-to-face and had greater than 50% of counseling including as described above in assessment and plan.

## 2015-03-16 NOTE — Patient Instructions (Signed)
Good to see you  Ice is your friend in 6 hours Stay active! Try to work on the posture and the shoulder exercises  See me again in 4 weeks or so.

## 2015-03-27 ENCOUNTER — Ambulatory Visit: Payer: 59 | Admitting: Family Medicine

## 2015-03-30 ENCOUNTER — Ambulatory Visit: Payer: 59 | Admitting: Family Medicine

## 2015-04-03 ENCOUNTER — Ambulatory Visit: Payer: 59 | Admitting: Family Medicine

## 2015-04-13 ENCOUNTER — Ambulatory Visit: Payer: 59 | Admitting: Family Medicine

## 2015-04-14 ENCOUNTER — Other Ambulatory Visit: Payer: Self-pay | Admitting: *Deleted

## 2015-04-14 MED ORDER — HYDROXYZINE HCL 10 MG PO TABS: 10.0000 mg | ORAL_TABLET | Freq: Every day | ORAL | Status: DC

## 2015-04-14 NOTE — Telephone Encounter (Signed)
Pt called requesting refill for hydroxyzine. Refill done.

## 2015-04-24 ENCOUNTER — Ambulatory Visit (INDEPENDENT_AMBULATORY_CARE_PROVIDER_SITE_OTHER): Payer: 59 | Admitting: Family Medicine

## 2015-04-24 ENCOUNTER — Encounter: Payer: Self-pay | Admitting: Family Medicine

## 2015-04-24 VITALS — BP 108/78 | HR 95 | Ht 61.0 in | Wt 146.0 lb

## 2015-04-24 DIAGNOSIS — Z8249 Family history of ischemic heart disease and other diseases of the circulatory system: Secondary | ICD-10-CM | POA: Diagnosis not present

## 2015-04-24 DIAGNOSIS — M9903 Segmental and somatic dysfunction of lumbar region: Secondary | ICD-10-CM | POA: Diagnosis not present

## 2015-04-24 DIAGNOSIS — M4806 Spinal stenosis, lumbar region: Secondary | ICD-10-CM

## 2015-04-24 DIAGNOSIS — M9902 Segmental and somatic dysfunction of thoracic region: Secondary | ICD-10-CM

## 2015-04-24 DIAGNOSIS — M9901 Segmental and somatic dysfunction of cervical region: Secondary | ICD-10-CM | POA: Diagnosis not present

## 2015-04-24 DIAGNOSIS — M999 Biomechanical lesion, unspecified: Secondary | ICD-10-CM

## 2015-04-24 DIAGNOSIS — M48061 Spinal stenosis, lumbar region without neurogenic claudication: Secondary | ICD-10-CM

## 2015-04-24 MED ORDER — FLUOXETINE HCL 40 MG PO CAPS: 40.0000 mg | ORAL_CAPSULE | Freq: Every day | ORAL | Status: DC

## 2015-04-24 NOTE — Assessment & Plan Note (Signed)
Doing well with conservative therapy. We will continue with the osteopathic manipulation on regular intervals. Encourage patient to do more core strengthening. Patient is had difficulty keeping this on a regular basis and has declined formal physical therapy. Patient and will come back and see me again in 5 weeks.

## 2015-04-24 NOTE — Progress Notes (Signed)
Pre visit review using our clinic review tool, if applicable. No additional management support is needed unless otherwise documented below in the visit note. 

## 2015-04-24 NOTE — Progress Notes (Signed)
[name] [address] [place], [place] [address] Phone: [phone] Subjective:    CC: Bilateral shoulder and neck pain follow up  [id number] Tamara [name] is a 55 y.o. female coming in with complaint of Back pain. Patient has been seen by me for quite some time. Patient has responded very well to osteopathic manipulation. Patient does have some degenerative disc disease at multiple levels. We continue to monitor closely. Patient has had some mild increasing pain with some of the work that she's been doing. Overall though seems to be feeling better.  Patient was having bilateral shoulder pain. Was given injections. No longer having any pain whatsoever.   anxiety and depression controlled the low dose effexor patient though states that she felt her Prozac seem to be helping more. Patient has switched on her own accord. Denies any suicidal or homicidal ideation.  Patient also gives history recently of her mother having Marfan syndrome. Patient's mother died of an aortic arch aneurysm. Patient's older brother was also just diagnosed with a aneurysm. Patient is wondering if she should see cardiology.   Past medical history, social, surgical and family history all reviewed in electronic medical record.   Review of Systems: No headache, visual changes, nausea, vomiting, diarrhea, constipation, dizziness, abdominal pain, skin rash, fevers, chills, night sweats, weight loss, swollen lymph nodes, body aches, joint swelling, muscle aches, chest pain, shortness of breath, mood changes.   Objective Blood pressure 108/78, pulse 95, height 5\' 1"  (1.549 m), weight 146 lb (66.225 kg), SpO2 98 %.  General: No apparent distress alert and oriented x3 mood and affect normal, dressed appropriately.  HEENT: Pupils equal, extraocular movements intact tympanic membrane on the right ear does have some bulging as well as an air fluid level. Respiratory: Patient's speak in full  sentences and does not appear short of breath  Cardiovascular: No lower extremity edema, non tender, no erythema  Skin: Warm dry intact with no signs of infection or rash on extremities or on axial skeleton.  Abdomen: Soft nontender  Neuro: Cranial nerves II through XII are intact, neurovascularly intact in all extremities with 2+ DTRs and 2+ pulses.  Lymph: No lymphadenopathy of posterior or anterior cervical chain or axillae bilaterally.  Gait normal with good balance and coordination.  MSK:  Non tender with full range of motion and good stability and symmetric strength and tone of shoulders, elbows, wrist, hip, knee and ankles bilaterally.  Back examination patient has some mild levoscoliosis of the thoracic spine, no change Neck: Inspection unremarkable. No palpable stepoffs. Negative Spurling's  Lacking the last 5 in all range of motion.stable Grip strength and sensation normal in bilateral hands Strength good C4 to T1 distribution No sensory change to C4 to T1 Negative Hoffman sign bilaterally Reflexes normal shoulders have full range of motion and no pain.  Back Exam:  Inspection: Unremarkable  Motion: Flexion 45 deg, Extension 45 deg, Side Bending to 45 deg bilaterally,  Rotation to 45 deg bilaterally  SLR laying: Positive left side XSLR laying: Negative  Palpable tenderness continued stiffness of the thoracic spine  FABER: negative. Sensory change: Gross sensation intact to all lumbar and sacral dermatomes.  Reflexes: 2+ at both patellar tendons, 2+ at achilles tendons, Babinski's downgoing.  Strength at foot  Plantar-flexion: 5/5 Dorsi-flexion: 5/5 Eversion: 5/5 Inversion: 5/5  Leg strength  Quad: 5/5 Hamstring: 5/5 Hip flexor: 5/5 Hip abductors: 4/5  Gait unremarkable.      Impression and Recommendations:  This case required medical decision making of moderate complexity.

## 2015-04-24 NOTE — Assessment & Plan Note (Signed)
Patient's mother and brother both have aneurysms. Patient's younger brother been diagnosed with Marfan's. Patient has no physical findings that seem to be associated with Marfan's. I do think though with the history in the family patient should be seen by cardiologist.

## 2015-04-24 NOTE — Assessment & Plan Note (Signed)
Decision today to treat with OMT was based on Physical Exam  After verbal consent patient was treated with FPR and muscle energy techniques in cervical and thoracic and lumbar. areas  Patient tolerated the procedure well with improvement in symptoms  Patient given exercises, stretches and lifestyle modifications  See medications in patient instructions if given  Patient will follow up in 5 weeks.

## 2015-05-14 ENCOUNTER — Other Ambulatory Visit: Payer: Self-pay | Admitting: *Deleted

## 2015-05-14 MED ORDER — HYDROXYZINE HCL 10 MG PO TABS: 10.0000 mg | ORAL_TABLET | Freq: Every day | ORAL | Status: DC

## 2015-05-14 NOTE — Telephone Encounter (Signed)
Refill done.  

## 2015-05-22 ENCOUNTER — Encounter: Payer: Self-pay | Admitting: Cardiovascular Disease

## 2015-05-22 ENCOUNTER — Ambulatory Visit (INDEPENDENT_AMBULATORY_CARE_PROVIDER_SITE_OTHER): Payer: 59 | Admitting: Cardiovascular Disease

## 2015-05-22 VITALS — BP 120/80 | HR 78 | Ht 62.0 in | Wt 146.4 lb

## 2015-05-22 DIAGNOSIS — I10 Essential (primary) hypertension: Secondary | ICD-10-CM | POA: Diagnosis not present

## 2015-05-22 DIAGNOSIS — Z8249 Family history of ischemic heart disease and other diseases of the circulatory system: Secondary | ICD-10-CM | POA: Diagnosis not present

## 2015-05-22 DIAGNOSIS — R0989 Other specified symptoms and signs involving the circulatory and respiratory systems: Secondary | ICD-10-CM | POA: Insufficient documentation

## 2015-05-22 DIAGNOSIS — R198 Other specified symptoms and signs involving the digestive system and abdomen: Secondary | ICD-10-CM | POA: Diagnosis not present

## 2015-05-22 NOTE — Progress Notes (Signed)
Cardiology Office Note   Date:  05/22/2015   ID:  VALA AVANCE, DOB 01-Sep-1960, MRN [id number]  PCP:  Cathlean Cower, MD  Cardiologist:   Acie Fredrickson Wonda Cheng, MD   Chief Complaint  Patient presents with  . Hypertension    Family hx of Marfans and aortic aneurism     Problem List 1. Essential HTN History of Present Illness: Tamara [name] is a 55 y.o. female who presents for further eval for possible aortic aneurism. Mother was diagnosed with Marfans and she died of an thoracic aortic aneurism. Brother has some sort of cardiac defect   No CP or dyspnea. Active, does not exercise. Her brother has physical characteristic c/w Marfans .  She had an echo in 1988.     Works at [employer].     Past Medical History  Diagnosis Date  . Depression   . Hypertension   . Allergic rhinitis, mild   . Anemia   . Ovarian cyst     Past Surgical History  Procedure Laterality Date  . Laminectomy    . Lumbar epidural injection  11/03/2014     Current Outpatient Prescriptions  Medication Sig Dispense Refill  . ALPRAZolam (XANAX) 0.5 MG tablet     . FLUoxetine (PROZAC) 40 MG capsule Take 1 capsule (40 mg total) by mouth daily. 90 capsule 1  . fluticasone (FLONASE) 50 MCG/ACT nasal spray Place 2 sprays into both nostrils daily. 16 g 6  . hydrOXYzine (ATARAX/VISTARIL) 10 MG tablet Take 1 tablet (10 mg total) by mouth daily. 30 tablet 3  . valsartan (DIOVAN) 320 MG tablet Take 1 tablet (320 mg total) by mouth daily. 90 tablet 3   No current facility-administered medications for this visit.    Allergies:   Gabapentin    Social History:  The patient  reports that she has never smoked. She does not have any smokeless tobacco history on file. She reports that she drinks about 1.8 oz of alcohol per week. She reports that she does not use illicit drugs.   Family History:  The patient's family history includes Alcohol abuse in her father; Depression in her father and mother.     ROS:  Please see the history of present illness.    Review of Systems: Constitutional:  denies fever, chills, diaphoresis, appetite change and fatigue.  HEENT: denies photophobia, eye pain, redness, hearing loss, ear pain, congestion, sore throat, rhinorrhea, sneezing, neck pain, neck stiffness and tinnitus.  Respiratory: denies SOB, DOE, cough, chest tightness, and wheezing.  Cardiovascular: denies chest pain, palpitations and leg swelling.  Gastrointestinal: denies nausea, vomiting, abdominal pain, diarrhea, constipation, blood in stool.  Genitourinary: denies dysuria, urgency, frequency, hematuria, flank pain and difficulty urinating.  Musculoskeletal: denies  myalgias, back pain, joint swelling, arthralgias and gait problem.   Skin: denies pallor, rash and wound.  Neurological: denies dizziness, seizures, syncope, weakness, light-headedness, numbness and headaches.   Hematological: denies adenopathy, easy bruising, personal or family bleeding history.  Psychiatric/ Behavioral: denies suicidal ideation, mood changes, confusion, nervousness, sleep disturbance and agitation.       All other systems are reviewed and negative.    PHYSICAL EXAM: VS:  BP 120/80 mmHg  Pulse 78  Ht 5\' 2"  (1.575 m)  Wt 146 lb 6.4 oz (66.407 kg)  BMI 26.77 kg/m2 , BMI Body mass index is 26.77 kg/(m^2). GEN: Well nourished, well developed, in no acute distress HEENT: normal Neck: no JVD, carotid bruits, or masses Cardiac: RRR; no murmurs, rubs,  or gallops,no edema  Respiratory:  clear to auscultation bilaterally, normal work of breathing GI: soft,  + pulsitile abdominal aorta . MS: no deformity or atrophy Skin: warm and dry, no rash Neuro:  Strength and sensation are intact Psych: normal  EKG:  EKG is ordered today. The ekg ordered today demonstrates  NSR at 78.   Sinus arrhythmia.     Recent Labs: 10/07/2014: TSH 2.332 02/17/2015: ALT 36*; BUN 10; Creatinine, Ser 0.62; Hemoglobin 13.9;  Platelets 292.0; Potassium 4.0; Sodium 140    Lipid Panel    Component Value Date/Time   CHOL 182 08/08/2014 1455   TRIG 73.0 08/08/2014 1455   HDL 79.60 08/08/2014 1455   CHOLHDL 2 08/08/2014 1455   VLDL 14.6 08/08/2014 1455   LDLCALC 88 08/08/2014 1455      Wt Readings from Last 3 Encounters:  05/22/15 146 lb 6.4 oz (66.407 kg)  04/24/15 146 lb (66.225 kg)  03/16/15 147 lb (66.679 kg)      Other studies Reviewed: Additional studies/ records that were reviewed today include: . Review of the above records demonstrates:    ASSESSMENT AND PLAN:  1.  Essential HTN:   Well controlled on Valsartan .   Continue current meds  . Will consider changing to beta blocker if she is found to have an aortic aneurism.   2. Family Hx of Ascending aortic aneurism: Mother died of rupture of an ascending aortic aneurism.   Had Marfans - but did not have typical Marfan features. Her Brother also has an ascending aortic aneurism. She has a pulsatile abdominal aorta.      Will get An echocardiogram to evaluate her ascending aorta and we will also get an abdominal duplex scan to evaluate her abdominal aorta. I will plan on seeing her back in one year-sooner if she's found have an aneurysm. We'll schedule her for a CT and gram if we need further evaluation.  I'll see her in one year.  Current medicines are reviewed at length with the patient today.  The patient does not have concerns regarding medicines.  The following changes have been made:  no change  Labs/ tests ordered today include:  No orders of the defined types were placed in this encounter.     Disposition:   FU with me in 1 year     Sendy Pluta, Wonda Cheng, MD  05/22/2015 11:36 AM    [place], [place], [place]  [address] Phone: [phone]; Fax: [phone]   [facility]  [address] [place], [place]  [address] [phone]   Fax [phone]

## 2015-05-22 NOTE — Patient Instructions (Signed)
Medication Instructions:  Your physician recommends that you continue on your current medications as directed. Please refer to the Current Medication list given to you today.  Labwork: NONE  Testing/Procedures: Your physician has requested that you have an echocardiogram. Echocardiography is a painless test that uses sound waves to create images of your heart. It provides your doctor with information about the size and shape of your heart and how well your heart's chambers and valves are working. This procedure takes approximately one hour. There are no restrictions for this procedure.  Your physician has requested that you have an abdominal aorta duplex. During this test, an ultrasound is used to evaluate the aorta. Allow 30 minutes for this exam. Do not eat after midnight the day before and avoid carbonated beverages  Follow-Up: Your physician wants you to follow-up in: 12 months with Dr. Acie Fredrickson. You will receive a reminder letter in the mail two months in advance. If you don't receive a letter, please call our office to schedule the follow-up appointment.  If you need a refill on your cardiac medications before your next appointment, please call your pharmacy.

## 2015-06-01 ENCOUNTER — Encounter: Payer: Self-pay | Admitting: Family Medicine

## 2015-06-01 ENCOUNTER — Ambulatory Visit (INDEPENDENT_AMBULATORY_CARE_PROVIDER_SITE_OTHER): Payer: 59 | Admitting: Family Medicine

## 2015-06-01 VITALS — BP 118/76 | HR 82 | Wt 147.0 lb

## 2015-06-01 DIAGNOSIS — M999 Biomechanical lesion, unspecified: Secondary | ICD-10-CM

## 2015-06-01 DIAGNOSIS — M9901 Segmental and somatic dysfunction of cervical region: Secondary | ICD-10-CM | POA: Diagnosis not present

## 2015-06-01 DIAGNOSIS — M25511 Pain in right shoulder: Secondary | ICD-10-CM

## 2015-06-01 DIAGNOSIS — M9902 Segmental and somatic dysfunction of thoracic region: Secondary | ICD-10-CM

## 2015-06-01 DIAGNOSIS — M25512 Pain in left shoulder: Secondary | ICD-10-CM

## 2015-06-01 DIAGNOSIS — M9903 Segmental and somatic dysfunction of lumbar region: Secondary | ICD-10-CM | POA: Diagnosis not present

## 2015-06-01 NOTE — Patient Instructions (Signed)
Good to see you  Ice when you need it Get busy on the exercises See me again in 6-8 weeks.

## 2015-06-01 NOTE — Assessment & Plan Note (Signed)
Decision today to treat with OMT was based on Physical Exam  After verbal consent patient was treated with FPR and muscle energy techniques in cervical and thoracic and lumbar. areas  Patient tolerated the procedure well with improvement in symptoms  Patient given exercises, stretches and lifestyle modifications  See medications in patient instructions if given  Patient will follow up in 6-8 weeks.

## 2015-06-01 NOTE — Assessment & Plan Note (Signed)
Bilateral shoulder pain is secondary to tightness of the muscles around the neck and upper back. I think that this is contributing to most of the discomfort. We discussed the importance of the posture and ergonomics wrap today. Patient and will come back and see me again in 6-8 weeks. Patient will try to work out on a more regular basis.

## 2015-06-01 NOTE — Progress Notes (Signed)
  [name] [place], [place] [address] Phone: [phone] Subjective:    CC: Bilateral shoulder and neck pain follow up  [id number] Tamara [name] is a 55 y.o. female coming in with complaint of Back pain. Patient has been seen by me for quite some time. Patient has responded very well to osteopathic manipulation. Patient continues to have some bilateral shoulder pain as well as neck pain. Patient continues on the medications and seems to be doing well.patient has not been doing the exercises regularly and does feel more tightness. No radiation down the arm or any numbness or tingling.  Patient also gives history recently of her mother having Marfan syndrome. Patient's mother died of an aortic arch aneurysm. Patient's older brother was also just diagnosed with a aneurysm. Patient has seen cardiology and is awaiting echocardiogram.  Past medical history, social, surgical and family history all reviewed in electronic medical record.   Review of Systems: No headache, visual changes, nausea, vomiting, diarrhea, constipation, dizziness, abdominal pain, skin rash, fevers, chills, night sweats, weight loss, swollen lymph nodes, body aches, joint swelling, muscle aches, chest pain, shortness of breath, mood changes.   Objective Blood pressure 118/76, pulse 82, weight 147 lb (66.679 kg).  General: No apparent distress alert and oriented x3 mood and affect normal, dressed appropriately.  HEENT: Pupils equal, extraocular movements intact tympanic membrane on the right ear does have some bulging as well as an air fluid level. Respiratory: Patient's speak in full sentences and does not appear short of breath  Cardiovascular: No lower extremity edema, non tender, no erythema  Skin: Warm dry intact with no signs of infection or rash on extremities or on axial skeleton.  Abdomen: Soft nontender  Neuro: Cranial nerves II through XII are intact,  neurovascularly intact in all extremities with 2+ DTRs and 2+ pulses.  Lymph: No lymphadenopathy of posterior or anterior cervical chain or axillae bilaterally.  Gait normal with good balance and coordination.  MSK:  Non tender with full range of motion and good stability and symmetric strength and tone of shoulders, elbows, wrist, hip, knee and ankles bilaterally.  Back examination patient has some mild levoscoliosis of the thoracic spine, no change Neck: Inspection unremarkable. No palpable stepoffs. Negative Spurling's  Lacking the last 5 in all range of motion.stable Grip strength and sensation normal in bilateral hands Strength good C4 to T1 distribution No sensory change to C4 to T1 Negative Hoffman sign bilaterally Reflexes normal shoulders have full range of motion and no pain. Stable from previous exam  Back Exam:  Inspection: mild levoscoliosis Motion: Flexion 45 deg, Extension 45 deg, Side Bending to 45 deg bilaterally,  Rotation to 45 deg bilaterally  SLR laying: negative XSLR laying: Negative  Palpabletightness of the shoulder girdle musculature FABER: negative. Sensory change: Gross sensation intact to all lumbar and sacral dermatomes.  Reflexes: 2+ at both patellar tendons, 2+ at achilles tendons, Babinski's downgoing.  Strength at foot  Plantar-flexion: 5/5 Dorsi-flexion: 5/5 Eversion: 5/5 Inversion: 5/5  Leg strength  Quad: 5/5 Hamstring: 5/5 Hip flexor: 5/5 Hip abductors: 4/5  Gait unremarkable.  Osteopathic findings C2 flexed rotated and side bent left  Thoracic T4 extended rotated and side bent right T7 extended rotated and side bent right  Lumbar L2 flexed rotated and side bent right  Sacrum left on left   Impression and Recommendations:     This case required medical decision making of moderate complexity.

## 2015-06-04 ENCOUNTER — Other Ambulatory Visit (HOSPITAL_COMMUNITY): Payer: 59

## 2015-06-09 ENCOUNTER — Other Ambulatory Visit (HOSPITAL_COMMUNITY): Payer: 59

## 2015-06-16 ENCOUNTER — Other Ambulatory Visit (HOSPITAL_COMMUNITY): Payer: 59

## 2015-06-19 ENCOUNTER — Ambulatory Visit (HOSPITAL_COMMUNITY)
Admission: RE | Admit: 2015-06-19 | Discharge: 2015-06-19 | Disposition: A | Discharge status: Home / Self Care | Payer: 59 | Source: Ambulatory Visit | Attending: Cardiovascular Disease | Admitting: Cardiovascular Disease

## 2015-06-19 DIAGNOSIS — R198 Other specified symptoms and signs involving the digestive system and abdomen: Secondary | ICD-10-CM

## 2015-06-19 DIAGNOSIS — I071 Rheumatic tricuspid insufficiency: Secondary | ICD-10-CM | POA: Diagnosis not present

## 2015-06-19 DIAGNOSIS — Z8249 Family history of ischemic heart disease and other diseases of the circulatory system: Secondary | ICD-10-CM

## 2015-06-19 DIAGNOSIS — I10 Essential (primary) hypertension: Secondary | ICD-10-CM

## 2015-06-25 ENCOUNTER — Telehealth: Payer: Self-pay | Admitting: Cardiovascular Disease

## 2015-06-25 NOTE — Telephone Encounter (Signed)
LMTCB

## 2015-06-25 NOTE — Telephone Encounter (Signed)
Discussed echo and AAA reports done 06/19/15 with pt.

## 2015-06-25 NOTE — Telephone Encounter (Signed)
Pt returned call to [facility] re results of test-pls call

## 2015-07-17 ENCOUNTER — Ambulatory Visit: Payer: 59 | Admitting: Family Medicine

## 2015-07-20 ENCOUNTER — Ambulatory Visit: Payer: 59 | Admitting: Family Medicine

## 2015-07-20 ENCOUNTER — Telehealth: Payer: Self-pay | Admitting: Family Medicine

## 2015-07-20 MED ORDER — FLUTICASONE PROPIONATE 50 MCG/ACT NA SUSP: 2.0000 | Freq: Every day | NASAL | Status: DC

## 2015-07-20 NOTE — Telephone Encounter (Signed)
Patient had an appointment today but had to cancel because she believes she has an inner ear infection because she is very dizzy.  Patient states Dr. Tamala Julian gave her a script for nose drops to help with fluid behind the ear.  Patient states she is out of this.  She is not 100% that this is what is happening.  Patient is requesting additional drops to be sent to [facility] on [address].  Did notify patient that Dr. Tamala Julian is a DO.

## 2015-07-20 NOTE — Telephone Encounter (Signed)
done

## 2015-07-24 ENCOUNTER — Ambulatory Visit (INDEPENDENT_AMBULATORY_CARE_PROVIDER_SITE_OTHER): Payer: 59 | Admitting: Family Medicine

## 2015-07-24 ENCOUNTER — Encounter: Payer: Self-pay | Admitting: Family Medicine

## 2015-07-24 VITALS — BP 106/78 | HR 96 | Ht 62.0 in | Wt 144.0 lb

## 2015-07-24 DIAGNOSIS — M9902 Segmental and somatic dysfunction of thoracic region: Secondary | ICD-10-CM

## 2015-07-24 DIAGNOSIS — R42 Dizziness and giddiness: Secondary | ICD-10-CM

## 2015-07-24 DIAGNOSIS — H8110 Benign paroxysmal vertigo, unspecified ear: Secondary | ICD-10-CM | POA: Insufficient documentation

## 2015-07-24 DIAGNOSIS — M4722 Other spondylosis with radiculopathy, cervical region: Secondary | ICD-10-CM | POA: Diagnosis not present

## 2015-07-24 DIAGNOSIS — M9903 Segmental and somatic dysfunction of lumbar region: Secondary | ICD-10-CM | POA: Diagnosis not present

## 2015-07-24 DIAGNOSIS — M9901 Segmental and somatic dysfunction of cervical region: Secondary | ICD-10-CM | POA: Diagnosis not present

## 2015-07-24 DIAGNOSIS — M999 Biomechanical lesion, unspecified: Secondary | ICD-10-CM

## 2015-07-24 DIAGNOSIS — H8113 Benign paroxysmal vertigo, bilateral: Secondary | ICD-10-CM

## 2015-07-24 NOTE — Progress Notes (Signed)
[name] [place], [place] [address] Phone: [phone] Subjective:    CC: Bilateral shoulder and neck pain follow up  [id number] Tamara [name] is a 55 y.o. female coming in with complaint of Back pain. Patient has been seen by me for quite some time. Patient has responded very well to osteopathic manipulation. Patient unfortunately didn't recently fall. Patient fell down some stairs and hit the back of her hand. Since then she has been having some dizziness. Denies any trouble with concentration. Denies any visual changes. This was approximately 1 week ago but continues to give her trouble when she tries to change positions too quickly.   Past medical history, social, surgical and family history all reviewed in electronic medical record.   Review of Systems: No headache, visual changes, nausea, vomiting, diarrhea, constipation, dizziness, abdominal pain, skin rash, fevers, chills, night sweats, weight loss, swollen lymph nodes, body aches, joint swelling, muscle aches, chest pain, shortness of breath, mood changes.   Objective Blood pressure 106/78, pulse 96, height 5\' 2"  (1.575 m), weight 144 lb (65.318 kg), SpO2 97 %.  General: No apparent distress alert and oriented x3 mood and affect normal, dressed appropriately.  HEENT: Pupils equal, extraocular movements intact tympanic membrane on the right ear does have some bulging as well as an air fluid level. Respiratory: Patient's speak in full sentences and does not appear short of breath  Cardiovascular: No lower extremity edema, non tender, no erythema  Skin: Warm dry intact with no signs of infection or rash on extremities or on axial skeleton.  Abdomen: Soft nontender  Neuro: Cranial nerves II through XII are intact, neurovascularly intact in all extremities with 2+ DTRs and 2+ pulses.  Lymph: No lymphadenopathy of posterior or anterior cervical chain or axillae bilaterally.  Gait  normal with good balance and coordination.  MSK:  Non tender with full range of motion and good stability and symmetric strength and tone of shoulders, elbows, wrist, hip, knee and ankles bilaterally.  Back examination patient has some mild levoscoliosis of the thoracic spine, no change  With patient having changes in positions as well as any rotation of the head patient does get some vertebral symptoms. Consistent with benign positional vertigo.  Neck: Inspection unremarkable. No palpable stepoffs. Negative Spurling's  Lacking the last 5 in all range of motion.stable Grip strength and sensation normal in bilateral hands Strength good C4 to T1 distribution No sensory change to C4 to T1 Negative Hoffman sign bilaterally Reflexes normal shoulders have full range of motion and no pain. Stable from previous exam  Back Exam:  Inspection: mild levoscoliosis Motion: Flexion 45 deg, Extension 45 deg, Side Bending to 45 deg bilaterally,  Rotation to 45 deg bilaterally  SLR laying: negative XSLR laying: Negative  Less tenderness than previous exam in the paraspinal musculature FABER: negative. Sensory change: Gross sensation intact to all lumbar and sacral dermatomes.  Reflexes: 2+ at both patellar tendons, 2+ at achilles tendons, Babinski's downgoing.  Strength at foot  Plantar-flexion: 5/5 Dorsi-flexion: 5/5 Eversion: 5/5 Inversion: 5/5  Leg strength  Quad: 5/5 Hamstring: 5/5 Hip flexor: 5/5 Hip abductors: 4/5  Gait unremarkable.  Osteopathic findings C2 flexed rotated and side bent left  Thoracic T4 extended rotated and side bent right T7 extended rotated and side bent right  Lumbar L2 flexed rotated and side bent right  Sacrum left on left   Impression and Recommendations:     This case required medical decision making  of moderate complexity.

## 2015-07-24 NOTE — Patient Instructions (Signed)
Good to see yo  Lets get you in with therapy for the vertigo  Try the flonase again as well.  Keep trucking along.  See me again in 3-4 weeks and if need we can inject shoulders.

## 2015-07-24 NOTE — Assessment & Plan Note (Signed)
Overall seems to be stable. We will continue to monitor closely. We discussed icing regimen. Discuss continuing core strength as well as ergonomics as well as upper back. Patient will come back and see me again in 4-6 weeks.

## 2015-07-24 NOTE — Assessment & Plan Note (Signed)
Patient does not have any signs of concussion at this time. Patient is to be a neuro seems to be intact except for with the movements and seems to be consistent with more of the rotation of the head. I believe the patient does have more of a benign positional vertigo likely secondary to the fall. I do feel that vestibular neuro therapy could be beneficial for patient she will be referred today. We discussed continuing the Flonase on a regular basis in case that there is any injury year effusion that could be contributing. Patient will follow-up with me again in 4-6 weeks or call me if any worsening symptoms.

## 2015-07-24 NOTE — Assessment & Plan Note (Signed)
Decision today to treat with OMT was based on Physical Exam  After verbal consent patient was treated with FPR and muscle energy techniques in cervical and thoracic and lumbar. areas  Patient tolerated the procedure well with improvement in symptoms  Patient given exercises, stretches and lifestyle modifications  See medications in patient instructions if given  Patient will follow up in 4-6 weeks.

## 2015-07-24 NOTE — Progress Notes (Signed)
Pre visit review using our clinic review tool, if applicable. No additional management support is needed unless otherwise documented below in the visit note. 

## 2015-07-28 ENCOUNTER — Ambulatory Visit: Payer: 59 | Attending: Family Medicine | Admitting: Physical Therapy

## 2015-07-28 ENCOUNTER — Encounter: Payer: Self-pay | Admitting: Physical Therapy

## 2015-07-28 DIAGNOSIS — H8112 Benign paroxysmal vertigo, left ear: Secondary | ICD-10-CM

## 2015-07-28 DIAGNOSIS — R42 Dizziness and giddiness: Secondary | ICD-10-CM | POA: Insufficient documentation

## 2015-07-28 DIAGNOSIS — H8111 Benign paroxysmal vertigo, right ear: Secondary | ICD-10-CM | POA: Diagnosis not present
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
## 2015-07-31 ENCOUNTER — Ambulatory Visit: Payer: 59 | Admitting: Physical Therapy

## 2015-07-31 DIAGNOSIS — H8111 Benign paroxysmal vertigo, right ear: Secondary | ICD-10-CM | POA: Diagnosis not present

## 2015-07-31 DIAGNOSIS — H8112 Benign paroxysmal vertigo, left ear: Secondary | ICD-10-CM

## 2015-07-31 DIAGNOSIS — R42 Dizziness and giddiness: Secondary | ICD-10-CM
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
## 2015-07-31 NOTE — Patient Instructions (Addendum)
Tip Card 1.The goal of habituation training is to assist in decreasing symptoms of vertigo, dizziness, or nausea provoked by specific head and body motions. 2.These exercises may initially increase symptoms; however, be persistent and work through symptoms. With repetition and time, the exercises will assist in reducing or eliminating symptoms. 3.Exercises should be stopped and discussed with the therapist if you experience any of the following: - Sudden change or fluctuation in hearing - New onset of ringing in the ears, or increase in current intensity - Any fluid discharge from the ear - Severe pain in neck or back - Extreme nausea  Copyright  VHI. All rights reserved.  Rolling   With pillow under head, start on back. Roll quickly to your right side.  Hold until dizziness stops, plus 20 seconds and then roll quickly to the left side without stopping on your back.  Hold until dizziness stops, plus 20 seconds.  Repeat sequence 5 times per session. Do 2 sessions per day.

## 2015-08-07 ENCOUNTER — Ambulatory Visit: Payer: 59 | Admitting: Physical Therapy

## 2015-08-07 DIAGNOSIS — H8111 Benign paroxysmal vertigo, right ear: Secondary | ICD-10-CM

## 2015-08-07 DIAGNOSIS — H8112 Benign paroxysmal vertigo, left ear: Secondary | ICD-10-CM

## 2015-08-07 DIAGNOSIS — R42 Dizziness and giddiness: Secondary | ICD-10-CM

## 2015-08-07 NOTE — Patient Instructions (Signed)
Tip Card 1.The goal of habituation training is to assist in decreasing symptoms of vertigo, dizziness, or nausea provoked by specific head and body motions. 2.These exercises may initially increase symptoms; however, be persistent and work through symptoms. With repetition and time, the exercises will assist in reducing or eliminating symptoms. 3.Exercises should be stopped and discussed with the therapist if you experience any of the following: - Sudden change or fluctuation in hearing - New onset of ringing in the ears, or increase in current intensity - Any fluid discharge from the ear - Severe pain in neck or back - Extreme nausea  Copyright  VHI. All rights reserved.  Rolling   With pillow under head, start on back. Roll quickly to your right side. Hold until dizziness stops, plus 20 seconds and then roll quickly to the left side without stopping on your back. Hold until dizziness stops, plus 20 seconds. Repeat sequence 5 times per session. Do 2 sessions per day.  Copyright  VHI. All rights reserved.  Sit to Side-Lying   Sit on edge of bed. Lie down onto the right side and hold until dizziness stops, plus 20 seconds.  Return to sitting and wait until dizziness stops, plus 20 seconds.  Repeat to the left side. Repeat sequence 5 times per session. Do 2 sessions per day.
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
## 2015-08-11 ENCOUNTER — Other Ambulatory Visit: Payer: Self-pay | Admitting: *Deleted

## 2015-08-11 ENCOUNTER — Ambulatory Visit: Payer: 59 | Admitting: Physical Therapy

## 2015-08-11 DIAGNOSIS — H8111 Benign paroxysmal vertigo, right ear: Secondary | ICD-10-CM

## 2015-08-11 DIAGNOSIS — R42 Dizziness and giddiness: Secondary | ICD-10-CM

## 2015-08-11 MED ORDER — ALPRAZOLAM 0.5 MG PO TABS: 0.5000 mg | ORAL_TABLET | Freq: Three times a day (TID) | ORAL | Status: DC | PRN

## 2015-08-11 NOTE — Telephone Encounter (Signed)
Refill done.  
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
## 2015-08-11 NOTE — Patient Instructions (Signed)
Tip Card 1.The goal of habituation training is to assist in decreasing symptoms of vertigo, dizziness, or nausea provoked by specific head and body motions. 2.These exercises may initially increase symptoms; however, be persistent and work through symptoms. With repetition and time, the exercises will assist in reducing or eliminating symptoms. 3.Exercises should be stopped and discussed with the therapist if you experience any of the following: - Sudden change or fluctuation in hearing - New onset of ringing in the ears, or increase in current intensity - Any fluid discharge from the ear - Severe pain in neck or back - Extreme nausea  Copyright  VHI. All rights reserved.  Rolling   With pillow under head, start on back. Roll to your right side.  Hold until dizziness stops, plus 20 seconds and then roll to the left side.  Hold until dizziness stops, plus 20 seconds.  Repeat sequence 5 times per session. Do 2 sessions per day.  Copyright  VHI. All rights reserved.  Sit to Side-Lying   Sit on edge of bed. Lie down onto the right side and hold until dizziness stops, plus 20 seconds.  Return to sitting and wait until dizziness stops, plus 20 seconds.  Repeat to the left side. Repeat sequence 5 times per session. Do 2 sessions per day.  Copyright  VHI. All rights reserved.   

## 2015-08-14 ENCOUNTER — Encounter: Payer: 59 | Admitting: Internal Medicine

## 2015-08-14 ENCOUNTER — Encounter: Payer: 59 | Admitting: Physical Therapy

## 2015-08-17 ENCOUNTER — Other Ambulatory Visit: Payer: Self-pay

## 2015-08-17 MED ORDER — VALSARTAN 320 MG PO TABS
320.0000 mg | ORAL_TABLET | Freq: Every day | ORAL | Status: DC
Start: 2015-08-17 — End: 2015-08-28

## 2015-08-19 ENCOUNTER — Encounter: Payer: 59 | Admitting: Physical Therapy

## 2015-08-19 ENCOUNTER — Telehealth: Payer: Self-pay | Admitting: *Deleted

## 2015-08-19 NOTE — Telephone Encounter (Signed)
She already has an appt on Friday.

## 2015-08-19 NOTE — Telephone Encounter (Signed)
Pt called stating that she is having increased back pain & muscle spasms. She has an appt on 7.7.17 & would like to know if there is anything she can do in the mean time.

## 2015-08-19 NOTE — Telephone Encounter (Signed)
I would be fine with double book an orthovisc or add on Friday.

## 2015-08-19 NOTE — Telephone Encounter (Signed)
I can send her in prednisone.  Or she can try Ibuprofen.  Sent a message in my chart as well.

## 2015-08-20 NOTE — Telephone Encounter (Signed)
Spoke to pt, she stated she is feeling better today & will wait to see Dr. Tamala Julian tomorrow before doing anything else.

## 2015-08-21 ENCOUNTER — Encounter: Payer: Self-pay | Admitting: Family Medicine

## 2015-08-21 ENCOUNTER — Ambulatory Visit (INDEPENDENT_AMBULATORY_CARE_PROVIDER_SITE_OTHER): Payer: 59 | Admitting: Family Medicine

## 2015-08-21 ENCOUNTER — Encounter: Payer: 59 | Admitting: Physical Therapy

## 2015-08-21 VITALS — BP 108/76 | HR 86 | Wt 145.0 lb

## 2015-08-21 DIAGNOSIS — M9903 Segmental and somatic dysfunction of lumbar region: Secondary | ICD-10-CM | POA: Diagnosis not present

## 2015-08-21 DIAGNOSIS — M5416 Radiculopathy, lumbar region: Secondary | ICD-10-CM

## 2015-08-21 DIAGNOSIS — M4722 Other spondylosis with radiculopathy, cervical region: Secondary | ICD-10-CM

## 2015-08-21 DIAGNOSIS — M9902 Segmental and somatic dysfunction of thoracic region: Secondary | ICD-10-CM

## 2015-08-21 DIAGNOSIS — M9901 Segmental and somatic dysfunction of cervical region: Secondary | ICD-10-CM | POA: Diagnosis not present

## 2015-08-21 DIAGNOSIS — M999 Biomechanical lesion, unspecified: Secondary | ICD-10-CM

## 2015-08-21 NOTE — Assessment & Plan Note (Signed)
Stable at moment  

## 2015-08-21 NOTE — Patient Instructions (Signed)
Sorry on your dog We will get the epidural  Meloxicam daily for at least another 10 days.  Have a great trip  Monitor the vertigo, I think you are doing well.  See me again after your trip!

## 2015-08-21 NOTE — Assessment & Plan Note (Signed)
Decision today to treat with OMT was based on Physical Exam  After verbal consent patient was treated with FPR and muscle energy techniques in cervical and thoracic and lumbar. areas  Patient tolerated the procedure well with improvement in symptoms  Patient given exercises, stretches and lifestyle modifications  See medications in patient instructions if given  Patient will follow up in 4-6 weeks.

## 2015-08-21 NOTE — Assessment & Plan Note (Signed)
Patient is having more of a lumbar radiculopathy. We discussed icing regimen and home exercises. We discussed which activities doing which ones to potentially avoid. Patient will be set up for another epidural patient responding previously. Patient is going to be going on a vacation. We'll be coming back after the vacation for further evaluation and treatment.

## 2015-08-21 NOTE — Progress Notes (Signed)
[name] [place], [place] [address] Phone: [phone] Subjective:    CC: Bilateral shoulder and neck pain follow up  [id number] Tamara [name] is a 55 y.o. female coming in with complaint of Back pain. Percent severe enough back pain last year and that patient did have an MRI of the lumbar spine showing disc bulging on the left sideat L4-L5.  Patient did respond to an epidural steroid injection in September 2016. States that over the course last 3 weeks he started having the increasing sharp radicular symptoms going down the left leg. Seems to be worsening overall. Patient states that it feels very similar to what it was prior to the epidural but not as severe as a guide at that time. Starting to affect daily activities. Making it difficult to be comfortable seated or standing for long amount of time.  Past Medical History  Diagnosis Date  . Depression   . Hypertension   . Allergic rhinitis, mild   . Anemia   . Ovarian cyst    Past Surgical History  Procedure Laterality Date  . Laminectomy    . Lumbar epidural injection  11/03/2014   Social History  Substance Use Topics  . Smoking status: Never Smoker   . Smokeless tobacco: Not on file  . Alcohol Use: 1.8 oz/week    3 Standard drinks or equivalent per week   Allergies  Allergen Reactions  . Gabapentin Swelling   Family History  Problem Relation Age of Onset  . Depression Mother   . Depression Father   . Alcohol abuse Father      Past family history: Patient also gives history recently of her mother having Marfan syndrome. Patient's mother died of an aortic arch aneurysm. Patient's older brother was also just diagnosed with a aneurysm. Patient has follow-up with a cardiologist.  Past medical history, social, surgical and family history all reviewed in electronic medical record.   Review of Systems: No headache, visual changes, nausea, vomiting, diarrhea,  constipation, dizziness, abdominal pain, skin rash, fevers, chills, night sweats, weight loss, swollen lymph nodes, chest pain, shortness of breath, mood changes.   Objective Blood pressure 108/76, pulse 86, weight 145 lb (65.772 kg).  General: No apparent distress alert and oriented x3 mood and affect normal, dressed appropriately.  HEENT: Pupils equal, extraocular movements intact tympanic membrane on the right ear does have some bulging as well as an air fluid level. Respiratory: Patient's speak in full sentences and does not appear short of breath  Cardiovascular: No lower extremity edema, non tender, no erythema  Skin: Warm dry intact with no signs of infection or rash on extremities or on axial skeleton.  Abdomen: Soft nontender  Neuro: Cranial nerves II through XII are intact, neurovascularly intact in all extremities with 2+ DTRs and 2+ pulses.  Lymph: No lymphadenopathy of posterior or anterior cervical chain or axillae bilaterally.  Gait normal with good balance and coordination.  MSK:  Non tender with full range of motion and good stability and symmetric strength and tone of shoulders, elbows, wrist, hip, knee and ankles bilaterally.  Back examination patient has some mild levoscoliosis of the thoracic spine, no change Neck: Inspection unremarkable. No palpable stepoffs. Negative Spurling's  Lacking the last 5 in all range of motion.stable Grip strength and sensation normal in bilateral hands Strength good C4 to T1 distribution No sensory change to C4 to T1 Negative Hoffman sign bilaterally Reflexes normal shoulders have full range of motion  and no pain. Stable from previous exam  Back Exam:  Inspection: mild levoscoliosis Motion: Flexion 45 deg, Extension 45 deg, Side Bending to 45 deg bilaterally,  Rotation to 45 deg bilaterally  SLR laying: positive left side XSLR laying: Negative  Significant worsening in tightness and pain in the peri-spinal musculature of the  lumbar spine on the left side FABER: negative. Sensory change: Gross sensation intact to all lumbar and sacral dermatomes.  Reflexes: 2+ at both patellar tendons, 2+ at achilles tendons, Babinski's downgoing.  Strength at foot  Plantar-flexion: 5/5 Dorsi-flexion: 5/5 Eversion: 5/5 Inversion: 5/5  Leg strength  Quad: 5/5 Hamstring: 5/5 Hip flexor: 5/5 Hip abductors: 4/5  Gait unremarkable.  Osteopathic findings C2 flexed rotated and side bent left  Thoracic T4 extended rotated and side bent right T7 extended rotated and side bent right T10 extendedrotated and side bent left  Lumbar L2 flexed rotated and side bent right  Sacrum left on left   Impression and Recommendations:     This case required medical decision making of moderate complexity.

## 2015-08-24 ENCOUNTER — Encounter: Payer: 59 | Admitting: Physical Therapy

## 2015-08-27 ENCOUNTER — Ambulatory Visit
Admission: RE | Admit: 2015-08-27 | Discharge: 2015-08-27 | Disposition: A | Discharge status: Home / Self Care | Payer: 59 | Source: Ambulatory Visit | Attending: Family Medicine | Admitting: Family Medicine

## 2015-08-27 DIAGNOSIS — M5416 Radiculopathy, lumbar region: Secondary | ICD-10-CM

## 2015-08-27 MED ORDER — METHYLPREDNISOLONE ACETATE 40 MG/ML INJ SUSP (RADIOLOG
120.0000 mg | Freq: Once | INTRAMUSCULAR | Status: AC
  Administered 2015-08-27: 120 mg via EPIDURAL

## 2015-08-27 MED ORDER — IOPAMIDOL (ISOVUE-M 200) INJECTION 41%
1.0000 mL | Freq: Once | INTRAMUSCULAR | Status: AC
  Administered 2015-08-27: 1 mL via EPIDURAL

## 2015-08-27 NOTE — Discharge Instructions (Signed)

## 2015-08-28 ENCOUNTER — Encounter: Payer: Self-pay | Admitting: Internal Medicine

## 2015-08-28 ENCOUNTER — Other Ambulatory Visit: Payer: 59

## 2015-08-28 ENCOUNTER — Ambulatory Visit (INDEPENDENT_AMBULATORY_CARE_PROVIDER_SITE_OTHER): Payer: 59 | Admitting: Internal Medicine

## 2015-08-28 VITALS — BP 116/62 | HR 87 | Temp 97.9°F | Resp 20 | Wt 146.0 lb

## 2015-08-28 DIAGNOSIS — Z1159 Encounter for screening for other viral diseases: Secondary | ICD-10-CM | POA: Diagnosis not present

## 2015-08-28 DIAGNOSIS — F418 Other specified anxiety disorders: Secondary | ICD-10-CM | POA: Diagnosis not present

## 2015-08-28 DIAGNOSIS — I10 Essential (primary) hypertension: Secondary | ICD-10-CM | POA: Diagnosis not present

## 2015-08-28 DIAGNOSIS — Z0001 Encounter for general adult medical examination with abnormal findings: Secondary | ICD-10-CM | POA: Diagnosis not present

## 2015-08-28 DIAGNOSIS — R6889 Other general symptoms and signs: Secondary | ICD-10-CM

## 2015-08-28 MED ORDER — MELOXICAM 15 MG PO TABS: 15.0000 mg | ORAL_TABLET | Freq: Every day | ORAL | Status: DC | PRN

## 2015-08-28 MED ORDER — FLUTICASONE PROPIONATE 50 MCG/ACT NA SUSP: 2.0000 | Freq: Every day | NASAL | Status: DC

## 2015-08-28 MED ORDER — ALPRAZOLAM 0.5 MG PO TABS: 0.5000 mg | ORAL_TABLET | Freq: Three times a day (TID) | ORAL | Status: DC | PRN

## 2015-08-28 MED ORDER — VALSARTAN 320 MG PO TABS: 320.0000 mg | ORAL_TABLET | Freq: Every day | ORAL | Status: DC

## 2015-08-28 MED ORDER — FLUOXETINE HCL 40 MG PO CAPS: 40.0000 mg | ORAL_CAPSULE | Freq: Every day | ORAL | Status: DC

## 2015-08-28 NOTE — Patient Instructions (Signed)
Please continue all other medications as before, and refills have been done if requested.  Please have the pharmacy call with any other refills you may need.  Please continue your efforts at being more active, low cholesterol diet, and weight control.  You are otherwise up to date with prevention measures today.  Please keep your appointments with your specialists as you may have planned  Please return in 1 year for your yearly visit, or sooner if needed, with Lab testing done 3-5 days before  

## 2015-08-28 NOTE — Progress Notes (Signed)
Pre visit review using our clinic review tool, if applicable. No additional management support is needed unless otherwise documented below in the visit note. 

## 2015-08-28 NOTE — Progress Notes (Signed)
Subjective:    Patient ID: Tamara [name], female    DOB: 1960/05/02, 55 y.o.   MRN: [id number]  HPI  Here for wellness and f/u;  Overall doing ok;  Pt denies Chest pain, worsening SOB, DOE, wheezing, orthopnea, PND, worsening LE edema, palpitations, dizziness or syncope.  Pt denies neurological change such as new headache, facial or extremity weakness.  Pt denies polydipsia, polyuria, or low sugar symptoms. Pt states overall good compliance with treatment and medications, good tolerability, and has been trying to follow appropriate diet.  Pt denies worsening depressive symptoms, suicidal ideation or panic. No fever, night sweats, wt loss, loss of appetite, or other constitutional symptoms.  Pt states good ability with ADL's, has low fall risk, home safety reviewed and adequate, no other significant changes in hearing or vision, and only occasionally active with exercise.   Pt reminds me that her mother and brother have both ascending aortic aneurysm; both hve marfans.  Recent menorrhagia resolved with IUD Recent vertigo resolved after vestib rehab Past Medical History  Diagnosis Date  . Depression   . Hypertension   . Allergic rhinitis, mild   . Anemia   . Ovarian cyst    Past Surgical History  Procedure Laterality Date  . Laminectomy    . Lumbar epidural injection  11/03/2014    reports that she has never smoked. She does not have any smokeless tobacco history on file. She reports that she drinks about 1.8 oz of alcohol per week. She reports that she does not use illicit drugs. family history includes Alcohol abuse in her father; Depression in her father and mother. Allergies  Allergen Reactions  . Gabapentin Swelling   Current Outpatient Prescriptions on File Prior to Visit  Medication Sig Dispense Refill  . hydrOXYzine (ATARAX/VISTARIL) 10 MG tablet Take 1 tablet (10 mg total) by mouth daily. 30 tablet 3   No current facility-administered medications on file prior to visit.    Review of Systems Constitutional: Negative for increased diaphoresis, or other activity, appetite or siginficant weight change other than noted HENT: Negative for worsening hearing loss, ear pain, facial swelling, mouth sores and neck stiffness.   Eyes: Negative for other worsening pain, redness or visual disturbance.  Respiratory: Negative for choking or stridor Cardiovascular: Negative for other chest pain and palpitations.  Gastrointestinal: Negative for worsening diarrhea, blood in stool, or abdominal distention Genitourinary: Negative for hematuria, flank pain or change in urine volume.  Musculoskeletal: Negative for myalgias or other joint complaints.  Skin: Negative for other color change and wound or drainage.  Neurological: Negative for syncope and numbness. other than noted Hematological: Negative for adenopathy. or other swelling Psychiatric/Behavioral: Negative for hallucinations, SI, self-injury, decreased concentration or other worsening agitation.      Objective:   Physical Exam BP 116/62 mmHg  Pulse 87  Temp(Src) 97.9 F (36.6 C) (Oral)  Resp 20  Wt 146 lb (66.225 kg)  SpO2 97% VS noted,  Constitutional: Pt is oriented to person, place, and time. Appears well-developed and well-nourished, in no significant distress Head: Normocephalic and atraumatic  Eyes: Conjunctivae and EOM are normal. Pupils are equal, round, and reactive to light Right Ear: External ear normal.  Left Ear: External ear normal Nose: Nose normal.  Mouth/Throat: Oropharynx is clear and moist  Neck: Normal range of motion. Neck supple. No JVD present. No tracheal deviation present or significant neck LA or mass Cardiovascular: Normal rate, regular rhythm, normal heart sounds and intact distal pulses.   Pulmonary/Chest:  Effort normal and breath sounds without rales or wheezing  Abdominal: Soft. Bowel sounds are normal. NT. No HSM  Musculoskeletal: Normal range of motion. Exhibits no  edema Lymphadenopathy: Has no cervical adenopathy.  Neurological: Pt is alert and oriented to person, place, and time. Pt has normal reflexes. No cranial nerve deficit. Motor grossly intact Skin: Skin is warm and dry. No rash noted or new ulcers Psychiatric:  Has normal mood and affect. Behavior is normal.    Lab Results  Component Value Date   WBC 6.5 02/17/2015   HGB 13.9 02/17/2015   HCT 41.7 02/17/2015   PLT 292.0 02/17/2015   GLUCOSE 81 02/17/2015   CHOL 182 08/08/2014   TRIG 73.0 08/08/2014   HDL 79.60 08/08/2014   LDLCALC 88 08/08/2014   ALT 36* 02/17/2015   AST 30 02/17/2015   NA 140 02/17/2015   K 4.0 02/17/2015   CL 105 02/17/2015   CREATININE 0.62 02/17/2015   BUN 10 02/17/2015   CO2 27 02/17/2015   TSH 2.332 10/07/2014    Most recent echo: ------------------------------------------------------------------- Transthoracic Echocardiography  Patient: Tamara, [name] MR #: [id number] Study Date: 06/19/2015 Gender: F Age: 5 Height: 157.5 cm Weight: 66.7 kg BSA: 1.73 m^2 Pt. Status: Room:  REFERRING Mertie Moores, M.D. ATTENDING Sanda Klein, MD PERFORMING [facility], Outpatient ORDERING Nahser, Jr SONOGRAPHER Jason Coop, RCS  cc:  ------------------------------------------------------------------- LV EF: 65% - 70%  ------------------------------------------------------------------- History: PMH: Family history of Marfan&'s, Risk factors: Hypertension.  ------------------------------------------------------------------- Study Conclusions  - Left ventricle: The cavity size was normal. Systolic function was  vigorous. The estimated ejection fraction was in the range of 65%  to 70%. Wall motion was normal; there were no regional wall  motion abnormalities. Doppler parameters are consistent with  abnormal left ventricular relaxation (grade 1 diastolic  dysfunction). There was  no evidence of elevated ventricular  filling pressure by Doppler parameters. - Aortic valve: Trileaflet; normal thickness leaflets. There was no  regurgitation. - Aortic root: The aortic root was normal in size. - Ascending aorta: The ascending aorta was normal in size. - Mitral valve: Structurally normal valve. - Right ventricle: The cavity size was normal. Wall thickness was  normal. Systolic function was normal. - Right atrium: The atrium was normal in size. - Tricuspid valve: There was mild regurgitation. - Pulmonic valve: There was no regurgitation. - Pulmonary arteries: Systolic pressure was within the normal  range. - Inferior vena cava: The vessel was normal in size. - Pericardium, extracardiac: There was no pericardial effusion.    Assessment & Plan:

## 2015-08-29 LAB — HEPATITIS C ANTIBODY: HCV AB: NEGATIVE

## 2015-09-05 NOTE — Assessment & Plan Note (Signed)
stable overall by history and exam, recent data reviewed with pt, and pt to continue medical treatment as before,  to f/u any worsening symptoms or concerns BP Readings from Last 3 Encounters:  08/28/15 116/62  08/27/15 108/72  08/21/15 108/76

## 2015-09-05 NOTE — Assessment & Plan Note (Signed)
Stable, for med refill 

## 2015-09-05 NOTE — Assessment & Plan Note (Signed)

## 2015-09-17 NOTE — Assessment & Plan Note (Signed)
Responded to epidural Seems to be doing well.  Ice, HEP, posture, same meds at same dose Responds to OMT RTC in 4-6 weeks.

## 2015-09-17 NOTE — Assessment & Plan Note (Signed)
Decision today to treat with OMT was based on Physical Exam  After verbal consent patient was treated with FPR and muscle energy techniques in cervical and thoracic and lumbar. areas  Patient tolerated the procedure well with improvement in symptoms  Patient given exercises, stretches and lifestyle modifications  See medications in patient instructions if given  Patient will follow up in 4-6 weeks.

## 2015-09-17 NOTE — Progress Notes (Signed)
[name] [address] [place], [place] [address] Phone: [phone] Subjective:    CC: Bilateral shoulder and neck pain follow up  [id number]  Tamara [name] is a 55 y.o. female coming in with complaint of Back pain. Percent severe enough back pain last year and that patient did have an MRI of the lumbar spine showing disc bulging on the left sideat L4-L5.  Patient did respond to an epidural steroid injection in September 2016.  Was having worsening pain. The like to have another epidural 08/17/2015. States since then feeling significantly better. States that the lower back is felt great. Even has relieved some of the stress in her neck. Very happy with the results of far. Some mild discomfort here and there in the upper back that could respond to manipulation.  Past Medical History:  Diagnosis Date  . Allergic rhinitis, mild   . Anemia   . Depression   . Hypertension   . Ovarian cyst    Past Surgical History:  Procedure Laterality Date  . LAMINECTOMY    . LUMBAR EPIDURAL INJECTION  11/03/2014   Social History  Substance Use Topics  . Smoking status: Never Smoker  . Smokeless tobacco: Not on file  . Alcohol use 1.8 oz/week    3 Standard drinks or equivalent per week   Allergies  Allergen Reactions  . Gabapentin Swelling   Family History  Problem Relation Age of Onset  . Depression Mother   . Depression Father   . Alcohol abuse Father      Past family history: Patient also gives history recently of her mother having Marfan syndrome. Patient's mother died of an aortic arch aneurysm. Patient's older brother was also just diagnosed with a aneurysm. Patient has follow-up with a cardiologist.  Past medical history, social, surgical and family history all reviewed in electronic medical record.   Review of Systems: No headache, visual changes, nausea, vomiting, diarrhea, constipation, dizziness, abdominal pain, skin rash, fevers,  chills, night sweats, weight loss, swollen lymph nodes, chest pain, shortness of breath, mood changes.   Objective  There were no vitals taken for this visit.  General: No apparent distress alert and oriented x3 mood and affect normal, dressed appropriately.  HEENT: Pupils equal, extraocular movements intact tympanic membrane on the right ear does have some bulging as well as an air fluid level. Respiratory: Patient's speak in full sentences and does not appear short of breath  Cardiovascular: No lower extremity edema, non tender, no erythema  Skin: Warm dry intact with no signs of infection or rash on extremities or on axial skeleton.  Abdomen: Soft nontender  Neuro: Cranial nerves II through XII are intact, neurovascularly intact in all extremities with 2+ DTRs and 2+ pulses.  Lymph: No lymphadenopathy of posterior or anterior cervical chain or axillae bilaterally.  Gait normal with good balance and coordination.  MSK:  Non tender with full range of motion and good stability and symmetric strength and tone of shoulders, elbows, wrist, hip, knee and ankles bilaterally.  Back examination patient has some mild levoscoliosis of the thoracic spine, no change Neck: Inspection unremarkable. No palpable stepoffs. Negative Spurling's  Lacking the last 5 in all range of motion.stable Grip strength and sensation normal in bilateral hands Strength good C4 to T1 distribution No sensory change to C4 to T1 Negative Hoffman sign bilaterally Reflexes normal shoulders have full range of motion and no pain. Stable from previous exam  Back Exam:  Inspection: mild  levoscoliosis Motion: Flexion 45 deg, Extension 45 deg, Side Bending to 45 deg bilaterally,  Rotation to 45 deg bilaterally  SLR laying: positive left side XSLR laying: Negative  Significant worsening in tightness and pain in the peri-spinal musculature of the lumbar spine on the left side FABER: negative. Sensory change: Gross sensation  intact to all lumbar and sacral dermatomes.  Reflexes: 2+ at both patellar tendons, 2+ at achilles tendons, Babinski's downgoing.  Strength at foot  Plantar-flexion: 5/5 Dorsi-flexion: 5/5 Eversion: 5/5 Inversion: 5/5  Leg strength  Quad: 5/5 Hamstring: 5/5 Hip flexor: 5/5 Hip abductors: 4/5  Gait unremarkable.  Osteopathic findings C2 flexed rotated and side bent left  Thoracic T4 extended rotated and side bent right T7 extended rotated and side bent right T12 extendedrotated and side bent left  Lumbar L2 flexed rotated and side bent right  Sacrum left on left   Impression and Recommendations:     This case required medical decision making of moderate complexity.

## 2015-09-18 ENCOUNTER — Ambulatory Visit (INDEPENDENT_AMBULATORY_CARE_PROVIDER_SITE_OTHER): Payer: 59 | Admitting: Family Medicine

## 2015-09-18 ENCOUNTER — Encounter: Payer: Self-pay | Admitting: Family Medicine

## 2015-09-18 VITALS — BP 112/80 | HR 81 | Wt 144.0 lb

## 2015-09-18 DIAGNOSIS — M5416 Radiculopathy, lumbar region: Secondary | ICD-10-CM

## 2015-09-18 DIAGNOSIS — M48061 Spinal stenosis, lumbar region without neurogenic claudication: Secondary | ICD-10-CM

## 2015-09-18 DIAGNOSIS — M999 Biomechanical lesion, unspecified: Secondary | ICD-10-CM

## 2015-09-18 DIAGNOSIS — M9901 Segmental and somatic dysfunction of cervical region: Secondary | ICD-10-CM | POA: Diagnosis not present

## 2015-09-18 DIAGNOSIS — M9902 Segmental and somatic dysfunction of thoracic region: Secondary | ICD-10-CM | POA: Diagnosis not present

## 2015-09-18 DIAGNOSIS — M4806 Spinal stenosis, lumbar region: Secondary | ICD-10-CM

## 2015-09-18 DIAGNOSIS — M9903 Segmental and somatic dysfunction of lumbar region: Secondary | ICD-10-CM

## 2015-09-18 NOTE — Patient Instructions (Signed)
Awesome! Have a great trip Ice when you need it Tennis ball and 5# weight can help See me again 6 weeks

## 2015-09-18 NOTE — Assessment & Plan Note (Signed)
Much better after the epidural. Has had one in September 2016 and now one in July 2017. Likely will do well with conservative therapy otherwise. We discussed the needs worsening radicular symptoms and may need to seek further intervention. Patient though is doing the physical therapy and a more regular basis. Patient will be spaced out the 4-6 week intervals for manipulation.

## 2015-09-25 ENCOUNTER — Other Ambulatory Visit: Payer: Self-pay | Admitting: Family Medicine

## 2015-09-28 NOTE — Telephone Encounter (Signed)
Refill done.  

## 2015-10-02 ENCOUNTER — Telehealth: Payer: Self-pay | Admitting: Family Medicine

## 2015-10-02 NOTE — Telephone Encounter (Signed)
Patient states that insurance recently denied epidural ordered by Dr. Tamala Julian.  Patient states she spoke with insurance and states that insurance is requesting a letter of appeal.

## 2015-10-06 NOTE — Telephone Encounter (Signed)
Wrote note and printed it.

## 2015-10-06 NOTE — Telephone Encounter (Signed)
Patient called back in- to provide further clarity:  The patient needs a letter of medical necessity in order to appeal her insurance companies decision to deny.

## 2015-10-06 NOTE — Telephone Encounter (Signed)
Pt made aware & will pick up at front desk.

## 2015-10-12 ENCOUNTER — Encounter: Payer: Self-pay | Admitting: *Deleted

## 2015-10-12 ENCOUNTER — Telehealth: Payer: Self-pay | Admitting: Emergency Medicine

## 2015-10-12 NOTE — Telephone Encounter (Signed)
Pt called and wants to know if you can give her a call. She has a letter stating why she needed an epidural but she thinks there might be a typo that needs to be fixed. Please follow up thanks.

## 2015-10-12 NOTE — Telephone Encounter (Signed)
Spoke to pt & fixed mistake in letter.

## 2015-10-13 ENCOUNTER — Other Ambulatory Visit: Payer: Self-pay | Admitting: Family Medicine

## 2015-10-13 NOTE — Telephone Encounter (Signed)
Xanax appears to be too soon, sorry we cannot do early refills for controlled substances

## 2015-10-14 ENCOUNTER — Other Ambulatory Visit: Payer: Self-pay | Admitting: Family Medicine

## 2015-10-14 NOTE — Telephone Encounter (Signed)
Per dr Jenny Reichmann, pt has requested refill too soon.  Refill denied.

## 2015-10-29 NOTE — Assessment & Plan Note (Signed)
Decision today to treat with OMT was based on Physical Exam  After verbal consent patient was treated with FPR and muscle energy techniques in cervical and thoracic and lumbar. areas  Patient tolerated the procedure well with improvement in symptoms  Patient given exercises, stretches and lifestyle modifications  See medications in patient instructions if given  Patient will follow up in 4-6 weeks.

## 2015-10-29 NOTE — Progress Notes (Signed)
[name] [place], [place] [address] Phone: [phone] Subjective:    CC: Bilateral shoulder and neck pain follow up  [id number]  Tamara [name] is a 54 y.o. female coming in with complaint of Back pain. Percent severe enough back pain last year and that patient did have an MRI of the lumbar spine showing disc bulging on the left sideat L4-L5.  Patient did respond to an epidural steroid injection in September 2016.  Was having worsening pain. The like to have another epidural 08/17/2015. States since then feeling significantly better. States that the lower back is felt great.  Still doing very well at this time. Patient states that she is not having any significant pain at all. Some mild tightness in the neck but nothing that is severe. No radiation of the pain. Has been even attempting to lose weight and try to be more active with walking.  Past Medical History:  Diagnosis Date  . Allergic rhinitis, mild   . Anemia   . Depression   . Hypertension   . Ovarian cyst    Past Surgical History:  Procedure Laterality Date  . LAMINECTOMY    . LUMBAR EPIDURAL INJECTION  11/03/2014   Social History  Substance Use Topics  . Smoking status: Never Smoker  . Smokeless tobacco: Not on file  . Alcohol use 1.8 oz/week    3 Standard drinks or equivalent per week   Allergies  Allergen Reactions  . Gabapentin Swelling   Family History  Problem Relation Age of Onset  . Depression Mother   . Depression Father   . Alcohol abuse Father      Past family history: Patient also gives history recently of her mother having Marfan syndrome. Patient's mother died of an aortic arch aneurysm. Patient's older brother was also just diagnosed with a aneurysm. Patient has follow-up with a cardiologist.  Past medical history, social, surgical and family history all reviewed in electronic medical record.   Review of Systems: No headache, visual  changes, nausea, vomiting, diarrhea, constipation, dizziness, abdominal pain, skin rash, fevers, chills, night sweats, weight loss, swollen lymph nodes, chest pain, shortness of breath, mood changes.   Objective  Blood pressure 118/64, pulse 98, weight 145 lb (65.8 kg), SpO2 98 %.  General: No apparent distress alert and oriented x3 mood and affect normal, dressed appropriately.  HEENT: Pupils equal, extraocular movements intact tympanic membrane on the right ear does have some bulging as well as an air fluid level. Respiratory: Patient's speak in full sentences and does not appear short of breath  Cardiovascular: No lower extremity edema, non tender, no erythema  Skin: Warm dry intact with no signs of infection or rash on extremities or on axial skeleton.  Abdomen: Soft nontender  Neuro: Cranial nerves II through XII are intact, neurovascularly intact in all extremities with 2+ DTRs and 2+ pulses.  Lymph: No lymphadenopathy of posterior or anterior cervical chain or axillae bilaterally.  Gait normal with good balance and coordination.  MSK:  Non tender with full range of motion and good stability and symmetric strength and tone of shoulders, elbows, wrist, hip, knee and ankles bilaterally.  Back examination patient has some mild levoscoliosis of the thoracic spine, no change Neck: Inspection unremarkable. No palpable stepoffs. Negative Spurling's  Lacking the last 5 in all range of motion.stable Grip strength and sensation normal in bilateral hands Strength good C4 to T1 distribution No sensory change to C4 to T1 Negative  Hoffman sign bilaterally Reflexes normal shoulders have full range of motion and no pain. Stable from previous exam  Back Exam:  Inspection: mild levoscoliosis Motion: Flexion 45 deg, Extension 25 deg, Side Bending to 45 deg bilaterally,  Rotation to 45 deg bilaterally  SLR laying: positive left side XSLR laying: Negative  Significant worsening in tightness and  pain in the peri-spinal musculature of the lumbar spine on the left side FABER: negative. Sensory change: Gross sensation intact to all lumbar and sacral dermatomes.  Reflexes: 2+ at both patellar tendons, 2+ at achilles tendons, Babinski's downgoing.  Strength at foot  Plantar-flexion: 5/5 Dorsi-flexion: 5/5 Eversion: 5/5 Inversion: 5/5  Leg strength  Quad: 5/5 Hamstring: 5/5 Hip flexor: 5/5 Hip abductors: 4/5  Gait unremarkable.  Osteopathic findings C2 flexed rotated and side bent left  Thoracic T4 extended rotated and side bent right T7 extended rotated and side bent right T11 extendedrotated and side bent left  Lumbar L2 flexed rotated and side bent right  Sacrum left on left   Impression and Recommendations:     This case required medical decision making of moderate complexity.

## 2015-10-29 NOTE — Assessment & Plan Note (Signed)
Seems stable at this time °

## 2015-10-29 NOTE — Assessment & Plan Note (Signed)
Patient has spinal stenosis of the lumbar spine as well as other problems. Patient does have osteophytic changes of the neck as well. Has been doing relatively well with conservative therapy. Last epidural was July 3. Seems to be doing relatively well at this point. Patient come back and see me again in 6 weeks.

## 2015-10-30 ENCOUNTER — Encounter: Payer: Self-pay | Admitting: Family Medicine

## 2015-10-30 ENCOUNTER — Ambulatory Visit (INDEPENDENT_AMBULATORY_CARE_PROVIDER_SITE_OTHER): Payer: 59 | Admitting: Family Medicine

## 2015-10-30 VITALS — BP 118/64 | HR 98 | Wt 145.0 lb

## 2015-10-30 DIAGNOSIS — M546 Pain in thoracic spine: Secondary | ICD-10-CM

## 2015-10-30 DIAGNOSIS — M4806 Spinal stenosis, lumbar region: Secondary | ICD-10-CM | POA: Diagnosis not present

## 2015-10-30 DIAGNOSIS — M999 Biomechanical lesion, unspecified: Secondary | ICD-10-CM

## 2015-10-30 DIAGNOSIS — M9901 Segmental and somatic dysfunction of cervical region: Secondary | ICD-10-CM | POA: Diagnosis not present

## 2015-10-30 DIAGNOSIS — M9903 Segmental and somatic dysfunction of lumbar region: Secondary | ICD-10-CM | POA: Diagnosis not present

## 2015-10-30 DIAGNOSIS — M9902 Segmental and somatic dysfunction of thoracic region: Secondary | ICD-10-CM

## 2015-10-30 DIAGNOSIS — M48061 Spinal stenosis, lumbar region without neurogenic claudication: Secondary | ICD-10-CM

## 2015-10-30 DIAGNOSIS — M549 Dorsalgia, unspecified: Secondary | ICD-10-CM

## 2015-10-30 NOTE — Patient Instructions (Signed)
God to see you  You are the best  See me again in 7 weeks.

## 2015-11-11 ENCOUNTER — Other Ambulatory Visit: Payer: Self-pay | Admitting: Family Medicine

## 2015-11-11 NOTE — Telephone Encounter (Signed)
Done hardcopy to Corinne  

## 2015-11-11 NOTE — Telephone Encounter (Signed)
faxed

## 2015-11-28 ENCOUNTER — Ambulatory Visit: Payer: 59 | Admitting: Family Medicine

## 2015-12-18 ENCOUNTER — Ambulatory Visit: Payer: 59 | Admitting: Family Medicine

## 2015-12-21 ENCOUNTER — Ambulatory Visit: Payer: 59 | Admitting: Family Medicine

## 2016-01-11 ENCOUNTER — Other Ambulatory Visit: Payer: Self-pay | Admitting: Internal Medicine

## 2016-01-12 NOTE — Telephone Encounter (Signed)
Done hardcopy to Corinne  

## 2016-01-13 NOTE — Telephone Encounter (Signed)
faxed

## 2016-01-30 NOTE — Progress Notes (Signed)
[name] [address] [place], [place] [address] Phone: [phone] Subjective:    CC: Bilateral shoulder and neck pain follow up  [id number]  Tamara [name] is a 55 y.o. female coming in with complaint of Back pain. Percent severe enough back pain last year and that patient did have an MRI of the lumbar spine showing disc bulging on the left sideat L4-L5.  Patient did respond to an epidural steroid injection in September 2016.  Was having worsening pain. The like to have another epidural 08/17/2015. Patient was seen 2 months ago was having some mild worsening symptoms. Patient was to get another epidural but unfortunately insurance has declined at this moment she states. Has had her neurosurgeon attempt to get it approved but no success of far. He is having increasing stress in the neck as well as the middle back. Some mild radiation going down the arms. Starting affects some of the daily activities.  Past Medical History:  Diagnosis Date  . Allergic rhinitis, mild   . Anemia   . Depression   . Hypertension   . Ovarian cyst    Past Surgical History:  Procedure Laterality Date  . LAMINECTOMY    . LUMBAR EPIDURAL INJECTION  11/03/2014   Social History  Substance Use Topics  . Smoking status: Never Smoker  . Smokeless tobacco: Not on file  . Alcohol use 1.8 oz/week    3 Standard drinks or equivalent per week   Allergies  Allergen Reactions  . Gabapentin Swelling   Family History  Problem Relation Age of Onset  . Depression Mother   . Depression Father   . Alcohol abuse Father      Past family history: Patient also gives history recently of her mother having Marfan syndrome. Patient's mother died of an aortic arch aneurysm. Patient's older brother was also just diagnosed with a aneurysm. Patient has follow-up with a cardiologist.  Past medical history, social, surgical and family history all reviewed in electronic medical record.    Review of Systems: No headache, visual changes, nausea, vomiting, diarrhea, constipation, dizziness, abdominal pain, skin rash, fevers, chills, night sweats, weight loss, swollen lymph nodes, body aches, joint swelling, muscle aches, chest pain, shortness of breath, mood changes.    Objective  Blood pressure 122/84, pulse 94, height 5\' 2"  (1.575 m), weight 146 lb (66.2 kg), SpO2 97 %.  General: No apparent distress alert and oriented x3 mood and affect normal, dressed appropriately.  HEENT: Pupils equal, extraocular movements intact tympanic membrane on the right ear does have some bulging as well as an air fluid level. Respiratory: Patient's speak in full sentences and does not appear short of breath  Cardiovascular: No lower extremity edema, non tender, no erythema  Skin: Warm dry intact with no signs of infection or rash on extremities or on axial skeleton.  Abdomen: Soft nontender  Neuro: Cranial nerves II through XII are intact, neurovascularly intact in all extremities with 2+ DTRs and 2+ pulses.  Lymph: No lymphadenopathy of posterior or anterior cervical chain or axillae bilaterally.  Gait normal with good balance and coordination.  MSK:  Non tender with full range of motion and good stability and symmetric strength and tone of shoulders, elbows, wrist, hip, knee and ankles bilaterally.  Back examination patient has some mild levoscoliosis of the thoracic spine, no change Neck: Inspection unremarkable. No palpable stepoffs. Negative Spurling's  Significant increase in tightness of the musculature around the cervical spine. Grip strength and  sensation normal in bilateral hands Strength good C4 to T1 distribution No sensory change to C4 to T1 Negative Hoffman sign bilaterally Reflexes normal shoulders mild impingement bilaterally. Worsening from previous exam  Back Exam:  Inspection: mild levoscoliosis Motion: Flexion 35 deg, Extension 25 deg, Side Bending to 25 deg  bilaterally,  Rotation to 30 deg bilaterally  SLR laying: positive left side still present and 30 XSLR laying: Negative  Continued tenderness in the paraspinal musculature of the lumbar spine FABER: negative. Sensory change: Gross sensation intact to all lumbar and sacral dermatomes.  Reflexes: 2+ at both patellar tendons, 2+ at achilles tendons, Babinski's downgoing.  Strength at foot  Plantar-flexion: 5/5 Dorsi-flexion: 5/5 Eversion: 5/5 Inversion: 5/5  Leg strength  Quad: 5/5 Hamstring: 5/5 Hip flexor: 5/5 Hip abductors: 4/5  Gait unremarkable.  Osteopathic findings C2 flexed rotated and side bent left  Thoracic T4 extended rotated and side bent right T7 extended rotated and side bent right T8 extendedrotated and side bent left  Lumbar L2 flexed rotated and side bent right L4 flexed rotated and side bent left  Sacrum left on left   Impression and Recommendations:     This case required medical decision making of moderate complexity.

## 2016-02-01 ENCOUNTER — Ambulatory Visit (INDEPENDENT_AMBULATORY_CARE_PROVIDER_SITE_OTHER): Payer: 59 | Admitting: Family Medicine

## 2016-02-01 ENCOUNTER — Encounter: Payer: Self-pay | Admitting: Family Medicine

## 2016-02-01 VITALS — BP 122/84 | HR 94 | Ht 62.0 in | Wt 146.0 lb

## 2016-02-01 DIAGNOSIS — M4722 Other spondylosis with radiculopathy, cervical region: Secondary | ICD-10-CM | POA: Diagnosis not present

## 2016-02-01 DIAGNOSIS — M999 Biomechanical lesion, unspecified: Secondary | ICD-10-CM

## 2016-02-01 NOTE — Assessment & Plan Note (Signed)
Decision today to treat with OMT was based on Physical Exam  After verbal consent patient was treated with FPR and muscle energy techniques in cervical and thoracic and lumbar. areas  Patient tolerated the procedure well with improvement in symptoms  Patient given exercises, stretches and lifestyle modifications  See medications in patient instructions if given  Patient will follow up in 3-4 weeks.

## 2016-02-01 NOTE — Assessment & Plan Note (Signed)
Patient is having more of a negative Spurling's today but does unfortunately have signs and symptoms that is corresponding to the spinal stenosis at patient doesn't have. We discussed with patient at great length. Patient still thinks an epidural be beneficial but is awaiting word from her insurance company. We will continue with other conservative therapy. We discussed possibly restarting the gabapentin which patient declined. Respond fairly well to osteopathic manipulation and will come back again in 3-4 weeks.

## 2016-02-01 NOTE — Patient Instructions (Addendum)
Good to see you! Happy holidays!  If we can help with anything else.  Lets see how we do.  Lets only go 3-4 weeks.  Call me if you need prednisone. 00

## 2016-02-04 ENCOUNTER — Ambulatory Visit (INDEPENDENT_AMBULATORY_CARE_PROVIDER_SITE_OTHER): Payer: 59 | Admitting: Internal Medicine

## 2016-02-04 VITALS — BP 120/82 | HR 97 | Temp 98.4°F | Wt 150.0 lb

## 2016-02-04 DIAGNOSIS — I10 Essential (primary) hypertension: Secondary | ICD-10-CM | POA: Diagnosis not present

## 2016-02-04 DIAGNOSIS — L989 Disorder of the skin and subcutaneous tissue, unspecified: Secondary | ICD-10-CM | POA: Insufficient documentation

## 2016-02-04 DIAGNOSIS — J3489 Other specified disorders of nose and nasal sinuses: Secondary | ICD-10-CM | POA: Diagnosis not present

## 2016-02-04 MED ORDER — TRIAMCINOLONE ACETONIDE 0.1 % EX CREA: 1.0000 "application " | TOPICAL_CREAM | Freq: Two times a day (BID) | CUTANEOUS | 0 refills | Status: DC

## 2016-02-04 NOTE — Progress Notes (Signed)
Pre visit review using our clinic review tool, if applicable. No additional management support is needed unless otherwise documented below in the visit note. 

## 2016-02-04 NOTE — Patient Instructions (Signed)
Please take all new medication as prescribed - the cream 1-2 times per day  Please call in 1 week if not improved for dermatology referral if needed  You can also use the OTC Neosporin for the nasal sore; or call for Mupirocin ointment if not improved  Please continue all other medications as before, and refills have been done if requested.  Please have the pharmacy call with any other refills you may need.  Please keep your appointments with your specialists as you may have planned

## 2016-02-04 NOTE — Progress Notes (Signed)
Subjective:    Patient ID: Tamara [name], female    DOB: [birth year], 55 y.o.   MRN: [id number]  HPI  Here to f/u with c/o 1 wk itchy slightly scaly skin lesion to left mid neck, without pain, swelling, trauma, just seemed to appear.  No fever, redness, swelling, ST, cough, HA or sinus congestion.  Does have a nasal sore recurring left nose without swelling or d/c but not really related to the neck lesion.  Also mentions now FH has hx of 2 brother and mother with ascending aortic aneurysms, 2 now deceased with rupture just over 82 yo.  Pt denies chest pain, increased sob or doe, wheezing, orthopnea, PND, increased LE swelling, palpitations, dizziness or syncope.Has seen cardiology with stable exam with plan to f/u at 1 yr and echo at 7 yrs unless exam changes. Past Medical History:  Diagnosis Date  . Allergic rhinitis, mild   . Anemia   . Depression   . Hypertension   . Ovarian cyst    Past Surgical History:  Procedure Laterality Date  . LAMINECTOMY    . LUMBAR EPIDURAL INJECTION  11/03/2014    reports that she has never smoked. She does not have any smokeless tobacco history on file. She reports that she drinks about 1.8 oz of alcohol per week . She reports that she does not use drugs. family history includes Alcohol abuse in her father; Depression in her father and mother. Allergies  Allergen Reactions  . Gabapentin Swelling   Current Outpatient Prescriptions on File Prior to Visit  Medication Sig Dispense Refill  . ALPRAZolam (XANAX) 0.5 MG tablet take 1 tablet by mouth three times a day if needed for anxiety 90 tablet 1  . FLUoxetine (PROZAC) 40 MG capsule Take 1 capsule (40 mg total) by mouth daily. 90 capsule 3  . fluticasone (FLONASE) 50 MCG/ACT nasal spray Place 2 sprays into both nostrils daily. 16 g 6  . hydrOXYzine (ATARAX/VISTARIL) 10 MG tablet take 1 tablet by mouth daily 30 tablet 3  . meloxicam (MOBIC) 15 MG tablet Take 1 tablet (15 mg total) by mouth daily as  needed for pain. 90 tablet 1  . valsartan (DIOVAN) 320 MG tablet Take 1 tablet (320 mg total) by mouth daily. 90 tablet 3   No current facility-administered medications on file prior to visit.    Review of Systems  Constitutional: Negative for unusual diaphoresis or night sweats HENT: Negative for ear swelling or discharge Eyes: Negative for worsening visual haziness  Respiratory: Negative for choking and stridor.   Gastrointestinal: Negative for distension or worsening eructation Genitourinary: Negative for retention or change in urine volume.  Musculoskeletal: Negative for other MSK pain or swelling Skin: Negative for color change and worsening wound Neurological: Negative for tremors and numbness other than noted  Psychiatric/Behavioral: Negative for decreased concentration or agitation other than above   All other system neg per pt    Objective:   Physical Exam BP 120/82   Pulse 97   Temp 98.4 F (36.9 C) (Oral)   Wt 150 lb (68 kg)   SpO2 98%   BMI 27.44 kg/m  VS noted,  Constitutional: Pt appears in no apparent distress HENT: Head: NCAT.  Right Ear: External ear normal.  Left Ear: External ear normal.  Eyes: . Pupils are equal, round, and reactive to light. Conjunctivae and EOM are normal No active nasal sore at this time Neck: Normal range of motion. Neck supple.  Cardiovascular: Normal rate and regular  rhythm.   Pulmonary/Chest: Effort normal and breath sounds without rales or wheezing.  Abd:  Soft, NT, ND, + BS Neurological: Pt is alert. Not confused , motor grossly intact Skin: Skin is warm. No rash, no LE edema, left neck with 1 mm tan/fleshy slightly raised but regular nontender lesion slightly scaly Psychiatric: Pt behavior is normal. No agitation.  No other new exam changes    Assessment & Plan:

## 2016-02-05 DIAGNOSIS — J3489 Other specified disorders of nose and nasal sinuses: Secondary | ICD-10-CM | POA: Insufficient documentation

## 2016-02-05 NOTE — Assessment & Plan Note (Signed)
For neosporin prn,  to f/u any worsening symptoms or concerns

## 2016-02-05 NOTE — Assessment & Plan Note (Signed)
?   Eczema like, for triam cr prn, consider derm if not improved

## 2016-02-05 NOTE — Assessment & Plan Note (Signed)
stable overall by history and exam, recent data reviewed with pt, and pt to continue medical treatment as before,  to f/u any worsening symptoms or concerns BP Readings from Last 3 Encounters:  02/04/16 120/82  02/01/16 122/84  10/30/15 118/64

## 2016-02-10 ENCOUNTER — Telehealth: Payer: Self-pay | Admitting: Internal Medicine

## 2016-02-10 NOTE — Telephone Encounter (Signed)
Pt called in and said that she would like to talk to you  about a procedure that ins denied   Best number [phone]

## 2016-02-10 NOTE — Telephone Encounter (Signed)
Spoke to pt, her insurance denied her epidural after appealing them. She is going to drop by the paperwork to have dr Tamala Julian look at it.

## 2016-02-12 ENCOUNTER — Other Ambulatory Visit: Payer: Self-pay | Admitting: Family Medicine

## 2016-02-12 NOTE — Telephone Encounter (Signed)
Refill done.  

## 2016-02-17 ENCOUNTER — Encounter: Payer: Self-pay | Admitting: Internal Medicine

## 2016-02-17 ENCOUNTER — Ambulatory Visit (INDEPENDENT_AMBULATORY_CARE_PROVIDER_SITE_OTHER): Payer: 59 | Admitting: Internal Medicine

## 2016-02-17 VITALS — BP 124/76 | HR 106 | Temp 98.5°F | Resp 20 | Wt 153.0 lb

## 2016-02-17 DIAGNOSIS — I10 Essential (primary) hypertension: Secondary | ICD-10-CM

## 2016-02-17 DIAGNOSIS — R062 Wheezing: Secondary | ICD-10-CM

## 2016-02-17 DIAGNOSIS — R05 Cough: Secondary | ICD-10-CM | POA: Insufficient documentation

## 2016-02-17 DIAGNOSIS — R059 Cough, unspecified: Secondary | ICD-10-CM | POA: Insufficient documentation

## 2016-02-17 MED ORDER — HYDROCODONE-HOMATROPINE 5-1.5 MG/5ML PO SYRP: 5.0000 mL | ORAL_SOLUTION | Freq: Four times a day (QID) | ORAL | 0 refills | Status: AC | PRN

## 2016-02-17 MED ORDER — PREDNISONE 10 MG PO TABS: ORAL_TABLET | ORAL | 0 refills | Status: DC

## 2016-02-17 MED ORDER — AZITHROMYCIN 250 MG PO TABS: ORAL_TABLET | ORAL | 1 refills | Status: DC

## 2016-02-17 NOTE — Assessment & Plan Note (Addendum)
Mild to mod, declines cxr, c/w pna vs bronchitis, for antibx course,  to f/u any worsening symptoms or concerns

## 2016-02-17 NOTE — Progress Notes (Signed)
Letter done

## 2016-02-17 NOTE — Patient Instructions (Signed)
Please take all new medication as prescribed - the antibiotic, cough medicine and prednisone  Please continue all other medications as before, and refills have been done if requested.  Please have the pharmacy call with any other refills you may need.  Please keep your appointments with your specialists as you may have planned     

## 2016-02-17 NOTE — Assessment & Plan Note (Signed)
stable overall by history and exam, recent data reviewed with pt, and pt to continue medical treatment as before,  to f/u any worsening symptoms or concerns BP Readings from Last 3 Encounters:  02/17/16 124/76  02/04/16 120/82  02/01/16 122/84

## 2016-02-17 NOTE — Assessment & Plan Note (Signed)
Mild to mod, for predpac asd, cough med prn use,  to f/u any worsening symptoms or concerns

## 2016-02-17 NOTE — Progress Notes (Signed)
Subjective:    Patient ID: Tamara [name], female    DOB: 03/20/1960, 56 y.o.   MRN: [id number]  HPI  Here with acute onset mild to mod 2-3 days ST, HA, general weakness and malaise, with prod cough greenish sputum, but Pt denies chest pain, increased sob or doe, wheezing, orthopnea, PND, increased LE swelling, palpitations, dizziness or syncope, except for onset mild wheezing/sob since last PM.  No other new history Past Medical History:  Diagnosis Date  . Allergic rhinitis, mild   . Anemia   . Depression   . Hypertension   . Ovarian cyst    Past Surgical History:  Procedure Laterality Date  . LAMINECTOMY    . LUMBAR EPIDURAL INJECTION  11/03/2014    reports that she has never smoked. She does not have any smokeless tobacco history on file. She reports that she drinks about 1.8 oz of alcohol per week . She reports that she does not use drugs. family history includes Alcohol abuse in her father; Depression in her father and mother. Allergies  Allergen Reactions  . Gabapentin Swelling   Current Outpatient Prescriptions on File Prior to Visit  Medication Sig Dispense Refill  . ALPRAZolam (XANAX) 0.5 MG tablet take 1 tablet by mouth three times a day if needed for anxiety 90 tablet 1  . FLUoxetine (PROZAC) 40 MG capsule Take 1 capsule (40 mg total) by mouth daily. 90 capsule 3  . fluticasone (FLONASE) 50 MCG/ACT nasal spray Place 2 sprays into both nostrils daily. 16 g 6  . hydrOXYzine (ATARAX/VISTARIL) 10 MG tablet take 1 tablet by mouth daily 30 tablet 3  . meloxicam (MOBIC) 15 MG tablet Take 1 tablet (15 mg total) by mouth daily as needed for pain. 90 tablet 1  . triamcinolone cream (KENALOG) 0.1 % Apply 1 application topically 2 (two) times daily. 30 g 0  . valsartan (DIOVAN) 320 MG tablet Take 1 tablet (320 mg total) by mouth daily. 90 tablet 3   No current facility-administered medications on file prior to visit.    Review of Systems  Constitutional: Negative for  unusual diaphoresis or night sweats HENT: Negative for ear swelling or discharge Eyes: Negative for worsening visual haziness  Respiratory: Negative for choking and stridor.   Gastrointestinal: Negative for distension or worsening eructation Genitourinary: Negative for retention or change in urine volume.  Musculoskeletal: Negative for other MSK pain or swelling Skin: Negative for color change and worsening wound Neurological: Negative for tremors and numbness other than noted  Psychiatric/Behavioral: Negative for decreased concentration or agitation other than above   All other system neg    Objective:   Physical Exam BP 124/76   Pulse (!) 106   Temp 98.5 F (36.9 C) (Oral)   Resp 20   Wt 153 lb (69.4 kg)   SpO2 97%   BMI 27.98 kg/m  VS noted, mild ill Constitutional: Pt appears in no apparent distress HENT: Head: NCAT.  Right Ear: External ear normal.  Left Ear: External ear normal.  Eyes: . Pupils are equal, round, and reactive to light. Conjunctivae and EOM are normal Neck: Normal range of motion. Neck supple.  Cardiovascular: Normal rate and regular rhythm.   Pulmonary/Chest: Effort normal and breath sounds without rales but with mild scattered wheezing.  Neurological: Pt is alert. Not confused , motor grossly intact Skin: Skin is warm. No rash, no LE edema Psychiatric: Pt behavior is normal. No agitation.  No other new exam findings    Assessment &  Plan:

## 2016-02-17 NOTE — Progress Notes (Signed)
Pre visit review using our clinic review tool, if applicable. No additional management support is needed unless otherwise documented below in the visit note. 

## 2016-02-22 ENCOUNTER — Ambulatory Visit (INDEPENDENT_AMBULATORY_CARE_PROVIDER_SITE_OTHER): Payer: 59 | Admitting: Family Medicine

## 2016-02-22 ENCOUNTER — Telehealth: Payer: Self-pay | Admitting: Family Medicine

## 2016-02-22 ENCOUNTER — Encounter: Payer: Self-pay | Admitting: Family Medicine

## 2016-02-22 VITALS — BP 128/82 | HR 118 | Ht 62.0 in | Wt 144.0 lb

## 2016-02-22 DIAGNOSIS — M999 Biomechanical lesion, unspecified: Secondary | ICD-10-CM

## 2016-02-22 DIAGNOSIS — M48061 Spinal stenosis, lumbar region without neurogenic claudication: Secondary | ICD-10-CM | POA: Diagnosis not present

## 2016-02-22 NOTE — Assessment & Plan Note (Signed)
Continues to have low back pain. We discussed possibly repeating epidural. Having difficulty with insurance at this time. Patient will continue to monitor symptoms. No significant weakness or constant numbness occurs that patient needs to seek medical attention immediately. Follow-up again in 4 weeks. Has been responding to manipulation for short periods of time.

## 2016-02-22 NOTE — Patient Instructions (Signed)
Good to see you  Bananas, rice apples and toast, then you can have what you want after you can tolerate this.  Ice is your friend Write me with a number and I will do my best  See me again in 3-4 weeks.

## 2016-02-22 NOTE — Telephone Encounter (Signed)
Dr. Tamala Julian was asking for phone number:  [facility] for [facility] [phone]

## 2016-02-22 NOTE — Progress Notes (Signed)
[name] [address] [place], [place] [address] Phone: [phone] Subjective:    CC: Bilateral shoulder and neck pain follow up  [id number]  Tamara [name] is a 56 y.o. female coming in with complaint of Back pain. Percent severe enough back pain last year and that patient did have an MRI of the lumbar spine showing disc bulging on the left sideat L4-L5.  Patient did respond to an epidural steroid injection in September 2016.  Was having worsening pain. T another epidural 08/17/2015. Patient was having worsening symptoms and was denuded another epidural. Unfortunately unable to get an secondary to patient's insurance denied it. Patient is wondering if there is anything else we can do.  Attempted to call the no significant improvement. Continues to have some dull throbbing aching pain between the shoulder blades. No radicular symptoms at this time. Lower back no radicular symptoms as well. Seems to be stable at this point.  Past Medical History:  Diagnosis Date  . Allergic rhinitis, mild   . Anemia   . Depression   . Hypertension   . Ovarian cyst    Past Surgical History:  Procedure Laterality Date  . LAMINECTOMY    . LUMBAR EPIDURAL INJECTION  11/03/2014   Social History  Substance Use Topics  . Smoking status: Never Smoker  . Smokeless tobacco: Not on file  . Alcohol use 1.8 oz/week    3 Standard drinks or equivalent per week   Allergies  Allergen Reactions  . Gabapentin Swelling   Family History  Problem Relation Age of Onset  . Depression Mother   . Depression Father   . Alcohol abuse Father      Past family history: Patient also gives history recently of her mother having Marfan syndrome. Patient's mother died of an aortic arch aneurysm. Patient's older brother was also just diagnosed with a aneurysm. Patient has follow-up with a cardiologist.  Past medical history, social, surgical and family history all reviewed in  electronic medical record.   Review of Systems: No headache, visual changes, nausea, vomiting,  constipation, dizziness, abdominal pain, skin rash, fevers, chills, night sweats, weight loss, swollen lymph nodes, body aches, joint swelling, muscle aches, chest pain, shortness of breath, mood changes. Positive diarrhea with patient getting over the stomach flu  Objective  Blood pressure 128/82, pulse (!) 118, height 5\' 2"  (1.575 m), weight 144 lb (65.3 kg), SpO2 97 %.  Systems examined below as of 02/22/16 General: NAD A&O x3 mood, affect normal  HEENT: Pupils equal, extraocular movements intact no nystagmus Respiratory: not short of breath at rest or with speaking Cardiovascular: No lower extremity edema, non tender Skin: Warm dry intact with no signs of infection or rash on extremities or on axial skeleton. Abdomen: Soft nontender, no masses Neuro: Cranial nerves  intact, neurovascularly intact in all extremities with 2+ DTRs and 2+ pulses. Lymph: No lymphadenopathy appreciated today  Gait normal with good balance and coordination.  MSK: Non tender with full range of motion and good stability and symmetric strength and tone of shoulders, elbows, wrist,  knee hips and ankles bilaterally.   Neck: Inspection unremarkable. No palpable stepoffs. Negative Spurling's  Continued significant tightness of the paraspinal musculature  Grip strength and sensation normal in bilateral hands Strength good C4 to T1 distribution No sensory change to C4 to T1 Negative Hoffman sign bilaterally Reflexes normal  Back Exam:  Inspection: mild levoscoliosis Motion: Flexion 35 deg, Extension 25 deg, Side Bending to 25  deg bilaterally,  Rotation to 30 deg bilaterally  SLR laying: Mild positive straight leg test but now and 40.  XSLR laying: Negative  Continued tenderness to palpation in the paraspinal musculature of the lumbar spine  FABER: negative. Sensory change: Gross sensation intact to all lumbar and  sacral dermatomes.  Reflexes: 2+ at both patellar tendons, 2+ at achilles tendons, Babinski's downgoing.  Strength at foot  Plantar-flexion: 5/5 Dorsi-flexion: 5/5 Eversion: 5/5 Inversion: 5/5  Leg strength  Quad: 5/5 Hamstring: 5/5 Hip flexor: 5/5 Hip abductors: 4+5  Gait unremarkable.  Osteopathic findings Cervical C2 flexed rotated and side bent right C4 flexed rotated and side bent left C6 flexed rotated and side bent left T3 extended rotated and side bent right inhaled third rib T10 extended rotated and side bent left L2 flexed rotated and side bent right Sacrum right on right    Impression and Recommendations:     This case required medical decision making of moderate complexity.

## 2016-02-22 NOTE — Assessment & Plan Note (Signed)
Decision today to treat with OMT was based on Physical Exam  After verbal consent patient was treated with FPR and muscle energy techniques in cervical and thoracic and lumbar. areas  Patient tolerated the procedure well with improvement in symptoms  Patient given exercises, stretches and lifestyle modifications  See medications in patient instructions if given  Patient will follow up in 4 weeks                   

## 2016-02-25 NOTE — Telephone Encounter (Signed)
Called patient back and unable to get through to the people for peer to peer.

## 2016-03-11 ENCOUNTER — Other Ambulatory Visit: Payer: Self-pay | Admitting: Internal Medicine

## 2016-03-11 NOTE — Telephone Encounter (Signed)
Done hardcopy to Corinne  

## 2016-03-11 NOTE — Telephone Encounter (Signed)
faxed

## 2016-03-14 ENCOUNTER — Ambulatory Visit (INDEPENDENT_AMBULATORY_CARE_PROVIDER_SITE_OTHER): Payer: 59 | Admitting: Family Medicine

## 2016-03-14 ENCOUNTER — Encounter: Payer: Self-pay | Admitting: Family Medicine

## 2016-03-14 VITALS — BP 112/80 | HR 102 | Ht 62.0 in | Wt 144.0 lb

## 2016-03-14 DIAGNOSIS — M48061 Spinal stenosis, lumbar region without neurogenic claudication: Secondary | ICD-10-CM | POA: Diagnosis not present

## 2016-03-14 DIAGNOSIS — M999 Biomechanical lesion, unspecified: Secondary | ICD-10-CM | POA: Diagnosis not present

## 2016-03-14 NOTE — Patient Instructions (Signed)
Good to see you  Any new number give it to me.  See me again in 4-6 weeks.

## 2016-03-14 NOTE — Assessment & Plan Note (Signed)
Has responded fairly well to epidural steroid injections. We'll need of any surgical intervention. We discussed icing regimen and home exercises. Discussed which activities doing which ones to avoid. Encouraged patient is seeming regularly for osteopathic manipulation every 4-6 weeks. Has different medications for break through pain. Follow-up in 4-6 weeks.

## 2016-03-14 NOTE — Assessment & Plan Note (Signed)
Decision today to treat with OMT was based on Physical Exam  After verbal consent patient was treated with HVLA, ME, FPR techniques in cervical, thoracic, rib lumbar and sacral areas  Patient tolerated the procedure well with improvement in symptoms  Patient given exercises, stretches and lifestyle modifications  See medications in patient instructions if given  Patient will follow up in 4-6 weeks 

## 2016-03-14 NOTE — Progress Notes (Signed)
[name] [address] [place], [place] [address] Phone: [phone] Subjective:    CC: Bilateral shoulder and neck pain follow up  [id number]  Tamara [name] is a 56 y.o. female coming in with complaint of Back pain. Percent severe enough back pain last year and that patient did have an MRI of the lumbar spine showing disc bulging on the left sideat L4-L5.  Patient did respond to an epidural steroid injection in September 2016.  Was having worsening pain.  another epidural 08/17/2015. Patient seemed to actually doing better. Feels that the increase in frequency of the manipulation has been more beneficial. Denies as much shoulder pain or any of the lower back pain with radiation. Not catching her anymore. Unable to do daily activities. Doing the exercises a little more regularly.  Past Medical History:  Diagnosis Date  . Allergic rhinitis, mild   . Anemia   . Depression   . Hypertension   . Ovarian cyst    Past Surgical History:  Procedure Laterality Date  . LAMINECTOMY    . LUMBAR EPIDURAL INJECTION  11/03/2014   Social History  Substance Use Topics  . Smoking status: Never Smoker  . Smokeless tobacco: Never Used  . Alcohol use 1.8 oz/week    3 Standard drinks or equivalent per week   Allergies  Allergen Reactions  . Gabapentin Swelling   Family History  Problem Relation Age of Onset  . Depression Mother   . Depression Father   . Alcohol abuse Father      Past family history: Patient also gives history recently of her mother having Marfan syndrome. Patient's mother died of an aortic arch aneurysm. Patient's older brother was also just diagnosed with a aneurysm. Patient has follow-up with a cardiologist.  Past medical history, social, surgical and family history all reviewed in electronic medical record.   Review of Systems: No headache, visual changes, nausea, vomiting, diarrhea, constipation, dizziness, abdominal pain, skin  rash, fevers, chills, night sweats, weight loss, swollen lymph nodes, body aches, joint swelling, muscle aches, chest pain, shortness of breath, mood changes.    Objective  Blood pressure 112/80, pulse (!) 102, height 5\' 2"  (1.575 m), weight 144 lb (65.3 kg), SpO2 98 %.  Systems examined below as of 03/14/16 General: NAD A&O x3 mood, affect normal  HEENT: Pupils equal, extraocular movements intact no nystagmus Respiratory: not short of breath at rest or with speaking Cardiovascular: No lower extremity edema, non tender Skin: Warm dry intact with no signs of infection or rash on extremities or on axial skeleton. Abdomen: Soft nontender, no masses Neuro: Cranial nerves  intact, neurovascularly intact in all extremities with 2+ DTRs and 2+ pulses. Lymph: No lymphadenopathy appreciated today  Gait normal with good balance and coordination.  MSK: Non tender with full range of motion and good stability and symmetric strength and tone of shoulders, elbows, wrist,  knee hips and ankles bilaterally.   Neck: Inspection unremarkable. No palpable stepoffs. Negative Spurling's maneuver. Mild tightness lacking the last 5 of right-sided rotation and side bending. Grip strength and sensation normal in bilateral hands Strength good C4 to T1 distribution No sensory change to C4 to T1 Negative Hoffman sign bilaterally Reflexes normal  Back Exam:  Inspection: Unremarkable  Motion: Flexion 45 deg, Extension 25 deg, Side Bending to 45 deg bilaterally,  Rotation to 45 deg bilaterally  SLR laying: Negative  XSLR laying: Negative  Palpable tenderness: Still increasing tenderness to palpation of the paraspinal  musculature of the lumbar spine right greater than left.Marland Kitchen FABER: Tight bilaterally. Sensory change: Gross sensation intact to all lumbar and sacral dermatomes.  Reflexes: 2+ at both patellar tendons, 2+ at achilles tendons, Babinski's downgoing.  Strength at foot  Plantar-flexion: 5/5  Dorsi-flexion: 5/5 Eversion: 5/5 Inversion: 5/5  Leg strength  Quad: 5/5 Hamstring: 5/5 Hip flexor: 5/5 Hip abductors: 5/5  Gait unremarkable.   Osteopathic findings Cervical C2 flexed rotated and side bent right C5 flexed rotated and side bent left  T3 extended rotated and side bent right inhaled third rib T7 extended rotated and side bent left L2 flexed rotated and side bent right L4 flexed rotated and side bent right Sacrum right on right     Impression and Recommendations:     This case required medical decision making of moderate complexity.

## 2016-04-07 ENCOUNTER — Telehealth: Payer: Self-pay | Admitting: Internal Medicine

## 2016-04-07 NOTE — Telephone Encounter (Signed)
Pt request to speak to the assistant concern about her appt. Please give her a call.

## 2016-04-07 NOTE — Telephone Encounter (Signed)
Discussed with pt

## 2016-04-11 ENCOUNTER — Ambulatory Visit: Payer: 59 | Admitting: Family Medicine

## 2016-05-19 ENCOUNTER — Ambulatory Visit: Payer: 59

## 2016-06-10 ENCOUNTER — Other Ambulatory Visit: Payer: Self-pay | Admitting: Internal Medicine

## 2016-06-10 ENCOUNTER — Other Ambulatory Visit: Payer: Self-pay | Admitting: Family Medicine

## 2016-06-10 NOTE — Telephone Encounter (Signed)
Done hardcopy to [facility]  

## 2016-06-10 NOTE — Telephone Encounter (Signed)
Faxed

## 2016-09-02 ENCOUNTER — Encounter: Payer: 59 | Admitting: Internal Medicine

## 2016-09-12 ENCOUNTER — Telehealth: Payer: Self-pay | Admitting: Internal Medicine

## 2016-09-12 ENCOUNTER — Other Ambulatory Visit (HOSPITAL_COMMUNITY): Payer: Self-pay | Admitting: Obstetrics & Gynecology

## 2016-09-12 ENCOUNTER — Other Ambulatory Visit: Payer: Self-pay | Admitting: Nurse Practitioner

## 2016-09-12 DIAGNOSIS — Z8279 Family history of other congenital malformations, deformations and chromosomal abnormalities: Secondary | ICD-10-CM

## 2016-09-12 DIAGNOSIS — Z1231 Encounter for screening mammogram for malignant neoplasm of breast: Secondary | ICD-10-CM

## 2016-09-12 DIAGNOSIS — I10 Essential (primary) hypertension: Secondary | ICD-10-CM

## 2016-09-12 MED ORDER — LOSARTAN POTASSIUM 100 MG PO TABS: 100.0000 mg | ORAL_TABLET | Freq: Every day | ORAL | 3 refills | Status: DC

## 2016-09-12 NOTE — Telephone Encounter (Signed)
Pt caled stating her pharmacy told her her valsartan (DIOVAN) 320 MG tablet  Was recalled she would like something else sent in  [facility] on [address] but is now owned by [employer]

## 2016-09-12 NOTE — Telephone Encounter (Signed)
Done - change to losartan 100

## 2016-09-13 ENCOUNTER — Other Ambulatory Visit: Payer: Self-pay | Admitting: Internal Medicine

## 2016-09-13 NOTE — Telephone Encounter (Signed)
Faxed

## 2016-09-13 NOTE — Telephone Encounter (Signed)
diovan has already been changed to [facility] for prozac/xanax asd - Done hardcopy to [employer]

## 2016-09-13 NOTE — Telephone Encounter (Signed)
Called pt no answer LMOM MD has sent new BP med to [facility]...Tamara [name]

## 2016-09-30 ENCOUNTER — Ambulatory Visit (INDEPENDENT_AMBULATORY_CARE_PROVIDER_SITE_OTHER): Payer: 59 | Admitting: Internal Medicine

## 2016-09-30 ENCOUNTER — Encounter: Payer: Self-pay | Admitting: Internal Medicine

## 2016-09-30 ENCOUNTER — Other Ambulatory Visit (INDEPENDENT_AMBULATORY_CARE_PROVIDER_SITE_OTHER): Payer: 59

## 2016-09-30 VITALS — BP 114/84 | HR 70 | Temp 98.5°F | Ht 62.0 in | Wt 148.0 lb

## 2016-09-30 DIAGNOSIS — I10 Essential (primary) hypertension: Secondary | ICD-10-CM

## 2016-09-30 DIAGNOSIS — Z114 Encounter for screening for human immunodeficiency virus [HIV]: Secondary | ICD-10-CM | POA: Diagnosis not present

## 2016-09-30 DIAGNOSIS — Z0000 Encounter for general adult medical examination without abnormal findings: Secondary | ICD-10-CM

## 2016-09-30 DIAGNOSIS — M999 Biomechanical lesion, unspecified: Secondary | ICD-10-CM

## 2016-09-30 LAB — CBC WITH DIFFERENTIAL/PLATELET
BASOS ABS: 0 10*3/uL (ref 0.0–0.1)
BASOS PCT: 1 % (ref 0.0–3.0)
EOS ABS: 1.1 10*3/uL — AB (ref 0.0–0.7)
Eosinophils Relative: 22.4 % — ABNORMAL HIGH (ref 0.0–5.0)
HEMATOCRIT: 42.4 % (ref 36.0–46.0)
HEMOGLOBIN: 14.4 g/dL (ref 12.0–15.0)
LYMPHS PCT: 29.8 % (ref 12.0–46.0)
Lymphs Abs: 1.4 10*3/uL (ref 0.7–4.0)
MCHC: 34.1 g/dL (ref 30.0–36.0)
MCV: 99.5 fl (ref 78.0–100.0)
MONO ABS: 0.4 10*3/uL (ref 0.1–1.0)
Monocytes Relative: 8.9 % (ref 3.0–12.0)
Neutro Abs: 1.8 10*3/uL (ref 1.4–7.7)
Neutrophils Relative %: 37.9 % — ABNORMAL LOW (ref 43.0–77.0)
Platelets: 293 10*3/uL (ref 150.0–400.0)
RBC: 4.26 Mil/uL (ref 3.87–5.11)
RDW: 12.1 % (ref 11.5–15.5)
WBC: 4.9 10*3/uL (ref 4.0–10.5)

## 2016-09-30 LAB — LIPID PANEL
CHOL/HDL RATIO: 2
CHOLESTEROL: 169 mg/dL (ref 0–200)
HDL: 87 mg/dL (ref >39.00)
LDL Cholesterol: 68 mg/dL (ref 0–99)
NonHDL: 82.08
TRIGLYCERIDES: 72 mg/dL (ref 0.0–149.0)
VLDL: 14.4 mg/dL (ref 0.0–40.0)

## 2016-09-30 LAB — URINALYSIS, ROUTINE W REFLEX MICROSCOPIC
Bilirubin Urine: NEGATIVE
HGB URINE DIPSTICK: NEGATIVE
KETONES UR: NEGATIVE
LEUKOCYTES UA: NEGATIVE
NITRITE: NEGATIVE
RBC / HPF: NONE SEEN (ref 0–?)
Specific Gravity, Urine: 1.01 (ref 1.000–1.030)
Total Protein, Urine: NEGATIVE
URINE GLUCOSE: NEGATIVE
UROBILINOGEN UA: 0.2 (ref 0.0–1.0)
WBC UA: NONE SEEN (ref 0–?)
pH: 7.5 (ref 5.0–8.0)

## 2016-09-30 LAB — HEPATIC FUNCTION PANEL
ALBUMIN: 4.2 g/dL (ref 3.5–5.2)
ALT: 18 U/L (ref 0–35)
AST: 19 U/L (ref 0–37)
Alkaline Phosphatase: 52 U/L (ref 39–117)
BILIRUBIN TOTAL: 1 mg/dL (ref 0.2–1.2)
Bilirubin, Direct: 0.2 mg/dL (ref 0.0–0.3)
TOTAL PROTEIN: 6.4 g/dL (ref 6.0–8.3)

## 2016-09-30 LAB — BASIC METABOLIC PANEL
BUN: 12 mg/dL (ref 6–23)
CHLORIDE: 105 meq/L (ref 96–112)
CO2: 28 mEq/L (ref 19–32)
CREATININE: 0.73 mg/dL (ref 0.40–1.20)
Calcium: 9.3 mg/dL (ref 8.4–10.5)
GFR: 88.38 mL/min (ref >60.00)
GLUCOSE: 89 mg/dL (ref 70–99)
Potassium: 4.6 mEq/L (ref 3.5–5.1)
Sodium: 141 mEq/L (ref 135–145)

## 2016-09-30 LAB — TSH: TSH: 2.08 u[IU]/mL (ref 0.35–4.50)

## 2016-09-30 NOTE — Patient Instructions (Signed)

## 2016-09-30 NOTE — Progress Notes (Addendum)
Subjective:    Patient ID: Tamara [name], female    DOB: [birth year], 56 y.o.   MRN: [id number]  HPI  Here for wellness and f/u;  Overall doing ok;  Pt denies Chest pain, worsening SOB, DOE, wheezing, orthopnea, PND, worsening LE edema, palpitations, dizziness or syncope.  Pt denies neurological change such as new headache, facial or extremity weakness.  Pt denies polydipsia, polyuria, or low sugar symptoms. Pt states overall good compliance with treatment and medications, good tolerability, and has been trying to follow appropriate diet.  Pt denies worsening depressive symptoms, suicidal ideation or panic. No fever, night sweats, wt loss, loss of appetite, or other constitutional symptoms.  Pt states good ability with ADL's, has low fall risk, home safety reviewed and adequate, no other significant changes in hearing or vision, and only occasionally active with exercise. Pt continues to have recurring mid back apin without change in severity, bowel or bladder change, fever, wt loss,  worsening LE pain/numbness/weakness, gait change or falls - asks for referral back to Dr Tamala Julian.  S/p ESI x 2 that helped but insurance is not coveing the second.   Past Medical History:  Diagnosis Date  . Allergic rhinitis, mild   . Anemia   . Depression   . Hypertension   . Ovarian cyst    Past Surgical History:  Procedure Laterality Date  . LAMINECTOMY    . LUMBAR EPIDURAL INJECTION  11/03/2014    reports that she has never smoked. She has never used smokeless tobacco. She reports that she drinks about 1.8 oz of alcohol per week . She reports that she does not use drugs. family history includes Alcohol abuse in her father; Depression in her father and mother. Allergies  Allergen Reactions  . Gabapentin Swelling   Current Outpatient Prescriptions on File Prior to Visit  Medication Sig Dispense Refill  . ALPRAZolam (XANAX) 0.5 MG tablet take 1 tablet by mouth three times a day if needed for anxiety 90  tablet 2  . FLUoxetine (PROZAC) 40 MG capsule take 1 capsule by mouth once daily 90 capsule 1  . fluticasone (FLONASE) 50 MCG/ACT nasal spray Place 2 sprays into both nostrils daily. 16 g 6  . hydrOXYzine (ATARAX/VISTARIL) 10 MG tablet take 1 tablet by mouth once daily 30 tablet 3  . losartan (COZAAR) 100 MG tablet Take 1 tablet (100 mg total) by mouth daily. 90 tablet 3  . meloxicam (MOBIC) 15 MG tablet Take 1 tablet (15 mg total) by mouth daily as needed for pain. 90 tablet 1   No current facility-administered medications on file prior to visit.    Review of Systems Constitutional: Negative for other unusual diaphoresis, sweats, appetite or weight changes HENT: Negative for other worsening hearing loss, ear pain, facial swelling, mouth sores or neck stiffness.   Eyes: Negative for other worsening pain, redness or other visual disturbance.  Respiratory: Negative for other stridor or swelling Cardiovascular: Negative for other palpitations or other chest pain  Gastrointestinal: Negative for worsening diarrhea or loose stools, blood in stool, distention or other pain Genitourinary: Negative for hematuria, flank pain or other change in urine volume.  Musculoskeletal: Negative for myalgias or other joint swelling.  Skin: Negative for other color change, or other wound or worsening drainage.  Neurological: Negative for other syncope or numbness. Hematological: Negative for other adenopathy or swelling Psychiatric/Behavioral: Negative for hallucinations, other worsening agitation, SI, self-injury, or new decreased concentration All other system neg per pt    Objective:  Physical Exam BP 114/84   Pulse 70   Temp 98.5 F (36.9 C)   Ht 5\' 2"  (1.575 m)   Wt 148 lb (67.1 kg)   SpO2 98%   BMI 27.07 kg/m  VS noted, obese Constitutional: Pt is oriented to person, place, and time. Appears well-developed and well-nourished, in no significant distress and comfortable Head: Normocephalic and  atraumatic  Eyes: Conjunctivae and EOM are normal. Pupils are equal, round, and reactive to light Right Ear: External ear normal without discharge Left Ear: External ear normal without discharge Nose: Nose without discharge or deformity Mouth/Throat: Oropharynx is without other ulcerations and moist  Neck: Normal range of motion. Neck supple. No JVD present. No tracheal deviation present or significant neck LA or mass Cardiovascular: Normal rate, regular rhythm, normal heart sounds and intact distal pulses.   Pulmonary/Chest: WOB normal and breath sounds without rales or wheezing  Abdominal: Soft. Bowel sounds are normal. NT. No HSM  Musculoskeletal: Normal range of motion. Exhibits no edema Lymphadenopathy: Has no other cervical adenopathy.  Neurological: Pt is alert and oriented to person, place, and time. Pt has normal reflexes. No cranial nerve deficit. Motor grossly intact, Gait intact Skin: Skin is warm and dry. No rash noted or new ulcerations Psychiatric:  Has nervous mood and affect. Behavior is normal without agitation No other exam findings       Assessment & Plan:

## 2016-10-01 LAB — HIV ANTIBODY (ROUTINE TESTING W REFLEX): HIV 1&2 Ab, 4th Generation: NONREACTIVE

## 2016-10-02 NOTE — Assessment & Plan Note (Signed)
[facility] for sport med f/u

## 2016-10-02 NOTE — Assessment & Plan Note (Signed)

## 2016-10-02 NOTE — Assessment & Plan Note (Signed)
BP Readings from Last 3 Encounters:  09/30/16 114/84  03/14/16 112/80  02/22/16 128/82  stable overall by history and exam, recent data reviewed with pt, and pt to continue medical treatment as before,  to f/u any worsening symptoms or concerns

## 2016-10-02 NOTE — Assessment & Plan Note (Signed)
[facility] for sport med f/u

## 2016-10-04 ENCOUNTER — Ambulatory Visit (INDEPENDENT_AMBULATORY_CARE_PROVIDER_SITE_OTHER): Payer: 59 | Admitting: Obstetrics & Gynecology

## 2016-10-04 ENCOUNTER — Ambulatory Visit
Admission: RE | Admit: 2016-10-04 | Discharge: 2016-10-04 | Disposition: A | Discharge status: Home / Self Care | Payer: 59 | Source: Ambulatory Visit | Attending: Obstetrics & Gynecology | Admitting: Obstetrics & Gynecology

## 2016-10-04 ENCOUNTER — Other Ambulatory Visit (HOSPITAL_COMMUNITY): Payer: 59

## 2016-10-04 ENCOUNTER — Encounter: Payer: Self-pay | Admitting: Obstetrics & Gynecology

## 2016-10-04 VITALS — BP 137/79 | HR 89 | Ht 61.5 in | Wt 149.0 lb

## 2016-10-04 DIAGNOSIS — H40033 Anatomical narrow angle, bilateral: Secondary | ICD-10-CM | POA: Diagnosis not present

## 2016-10-04 DIAGNOSIS — Z1231 Encounter for screening mammogram for malignant neoplasm of breast: Secondary | ICD-10-CM

## 2016-10-04 DIAGNOSIS — H04123 Dry eye syndrome of bilateral lacrimal glands: Secondary | ICD-10-CM | POA: Diagnosis not present

## 2016-10-04 DIAGNOSIS — Z01419 Encounter for gynecological examination (general) (routine) without abnormal findings: Secondary | ICD-10-CM

## 2016-10-04 MED ORDER — ESTRADIOL 10 MCG VA TABS: ORAL_TABLET | VAGINAL | 12 refills | Status: DC

## 2016-10-04 NOTE — Progress Notes (Signed)
Subjective:    Tamara [name] is a 56 y.o. MW P0 female who presents for an annual exam. The patient has no complaints today. The patient is sexually active. GYN screening history: last pap: was normal. The patient wears seatbelts: yes. The patient participates in regular exercise: no. Has the patient ever been transfused or tattooed?: no. The patient reports that there is not domestic violence in her life.   Menstrual History: OB History    Gravida Para Term Preterm AB Living   1       1     SAB TAB Ectopic Multiple Live Births   1              Menarche age: 61 Patient's last menstrual period was 06/28/2016 (approximate).    The following portions of the patient's history were reviewed and updated as appropriate: allergies, current medications, past family history, past medical history, past social history, past surgical history and problem list.  Review of Systems Pertinent items are noted in HPI.   Married since [place], still has some dyspareunia FH- no breast/gyn/colon cancer Colonoscopy utd   Objective:    BP 137/79   Pulse 89   Ht 5' 1.5" (1.562 m)   Wt 149 lb (67.6 kg)   LMP 06/28/2016 (Approximate)   BMI 27.70 kg/m   General Appearance:    Alert, cooperative, no distress, appears stated age  Head:    Normocephalic, without obvious abnormality, atraumatic  Eyes:    PERRL, conjunctiva/corneas clear, EOM's intact, fundi    benign, both eyes  Ears:    Normal TM's and external ear canals, both ears  Nose:   Nares normal, septum midline, mucosa normal, no drainage    or sinus tenderness  Throat:   Lips, mucosa, and tongue normal; teeth and gums normal  Neck:   Supple, symmetrical, trachea midline, no adenopathy;    thyroid:  no enlargement/tenderness/nodules; no carotid   bruit or JVD  Back:     Symmetric, no curvature, ROM normal, no CVA tenderness  Lungs:     Clear to auscultation bilaterally, respirations unlabored  Chest Wall:    No tenderness or  deformity   Heart:    Regular rate and rhythm, S1 and S2 normal, no murmur, rub   or gallop  Breast Exam:    No tenderness, masses, or nipple abnormality  Abdomen:     Soft, non-tender, bowel sounds active all four quadrants,    no masses, no organomegaly  Genitalia:    Normal female without lesion, discharge or tenderness, vulvovaginal atrophy, some voluntary clinching prior to exam, NSSA, NT, no adnexal masses or tenderness     Extremities:   Extremities normal, atraumatic, no cyanosis or edema  Pulses:   2+ and symmetric all extremities  Skin:   Skin color, texture, turgor normal, no rashes or lesions  Lymph nodes:   Cervical, supraclavicular, and axillary nodes normal  Neurologic:   CNII-XII intact, normal strength, sensation and reflexes    throughout  .    Assessment:    Healthy female exam.   Dyspareunia due to vulvovaginal dryness, not completely helped with lubricant   Plan:     Thin prep Pap smear. with cotesting Rec healthy lifestyle Generic vaginal estrogen, vagifem prescribed

## 2016-10-05 ENCOUNTER — Telehealth: Payer: Self-pay | Admitting: General Practice

## 2016-10-05 LAB — CYTOLOGY - PAP
Diagnosis: NEGATIVE
HPV (WINDOPATH): NOT DETECTED

## 2016-10-05 NOTE — Telephone Encounter (Signed)
Patient called and left message stating she was here today for an appt with Dr Hulan Fray who did her pap smear. Patient is wanting to know if HPV test will be included. Called patient, no answer- left message stating we are trying to reach you to return your phone call, we wanted to let you know we have received your message and the test you have requested will automatically be ran. If you have any questions you may give [place] a call back.

## 2016-11-14 ENCOUNTER — Ambulatory Visit: Payer: 59 | Admitting: Cardiovascular Disease

## 2016-12-12 DIAGNOSIS — Z23 Encounter for immunization: Secondary | ICD-10-CM | POA: Diagnosis not present

## 2017-01-12 ENCOUNTER — Telehealth: Payer: Self-pay | Admitting: Internal Medicine

## 2017-01-12 DIAGNOSIS — M549 Dorsalgia, unspecified: Secondary | ICD-10-CM

## 2017-01-12 NOTE — Telephone Encounter (Signed)
Can a referral be sent for her? Please advise.

## 2017-01-12 NOTE — Telephone Encounter (Signed)
Copied from [place] #[id number]. Topic: Referral - Request >> Jan 12, 2017  3:37 PM [name], [name] wrote: Reason for CRM: PCP is Dr Jenny Reichmann, patient is scheduled to see Dr Tamala Julian 01/17/17, patient states insurance request referral

## 2017-01-12 NOTE — Telephone Encounter (Signed)
Ok will do.

## 2017-01-13 NOTE — Telephone Encounter (Signed)
Left message advising that referral has been placed and dr smith's office should be calling to schedule appt

## 2017-01-16 NOTE — Progress Notes (Signed)
[name] [address] [place], [place] [address] Phone: [phone] Subjective:    I'm seeing this patient by the request  of:    CC: Back pain neck pain follow-up  [id number]  Tamara [name] is a 56 y.o. female coming in for follow up for back pain. She is having pain from cervical to lumbar spine. She said that her back is in bad shape. She feels that she is close to to the muscle spasming which causes most of her pain. She is constant pain.  Found to have severe degenerative disc disease previously.  Patient had responded fairly well to epidurals but unable to repeat secondary to insurance problems.  Patient states that over the course the last several weeks since increasing discomfort and pain again.  Patient feels like her depression is also getting worse with her on the Prozac.  Patient states that she is just having difficulty even doing regular daily activity secondary to the pain as well as with sleep becoming more and more difficult.  Mild radiation down the leg but very minimal at the time.  Wanting to know what else can be done.     Past Medical History:  Diagnosis Date  . Allergic rhinitis, mild   . Anemia   . Depression   . Hypertension   . Ovarian cyst    Past Surgical History:  Procedure Laterality Date  . LAMINECTOMY    . LUMBAR EPIDURAL INJECTION  11/03/2014   Social History   Socioeconomic History  . Marital status: Married    Spouse name: Not on file  . Number of children: 0  . Years of education: Not on file  . Highest education level: Not on file  Social Needs  . Financial resource strain: Not on file  . Food insecurity - worry: Not on file  . Food insecurity - inability: Not on file  . Transportation needs - medical: Not on file  . Transportation needs - non-medical: Not on file  Occupational History  . Not on file  Tobacco Use  . Smoking status: Never Smoker  . Smokeless tobacco: Never Used  Substance and  Sexual Activity  . Alcohol use: Yes    Alcohol/week: 1.8 oz    Types: 3 Standard drinks or equivalent per week  . Drug use: No  . Sexual activity: Yes    Partners: Male    Birth control/protection: IUD  Other Topics Concern  . Not on file  Social History Narrative  . Not on file   Allergies  Allergen Reactions  . Gabapentin Swelling   Family History  Problem Relation Age of Onset  . Depression Mother   . Depression Father   . Alcohol abuse Father      Past medical history, social, surgical and family history all reviewed in electronic medical record.  No pertanent information unless stated regarding to the chief complaint.   Review of Systems:Review of systems updated and as accurate as of 01/16/17  No headache, visual changes, nausea, vomiting, diarrhea, constipation, dizziness, abdominal pain, skin rash, fevers, chills, night sweats, weight loss, swollen lymph nodes, body aches, joint swelling, muscle aches, chest pain, shortness of breath, mood changes.  Positive muscle aches, body aches  Objective  There were no vitals taken for this visit. Systems examined below as of 01/16/17   General: No apparent distress alert and oriented x3 mood and affect normal, dressed appropriately.  HEENT: Pupils equal, extraocular movements intact  Respiratory: Patient's  speak in full sentences and does not appear short of breath  Cardiovascular: No lower extremity edema, non tender, no erythema  Skin: Warm dry intact with no signs of infection or rash on extremities or on axial skeleton.  Abdomen: Soft nontender  Neuro: Cranial nerves II through XII are intact, neurovascularly intact in all extremities with 2+ DTRs and 2+ pulses.  Lymph: No lymphadenopathy of posterior or anterior cervical chain or axillae bilaterally.  Gait normal with good balance and coordination.  MSK:  Non tender with full range of motion and good stability and symmetric strength and tone of shoulders, elbows, wrist,  hip, knee and ankles bilaterally.  Neck exam shows the patient does have some mild limitation in all planes.  Patient does have crepitus noted.  Negative Spurling's test.  Strength of the extremities is 5 out of 5 and symmetric  Lower back exam shows severe tenderness to palpation in the paraspinal musculature lumbar spine right greater than left.  Positive Faber test.  Significant tightness of the hamstrings bilaterally.  Osteopathic findings C2 flexed rotated and side bent right C4 flexed rotated and side bent left C6 flexed rotated and side bent left T3 extended rotated and side bent right inhaled third rib T9 extended rotated and side bent left L2 flexed rotated and side bent right Sacrum right on right     Impression and Recommendations:     This case required medical decision making of moderate complexity.      Note: This dictation was prepared with Dragon dictation along with smaller phrase technology. Any transcriptional errors that result from this process are unintentional.

## 2017-01-17 ENCOUNTER — Encounter: Payer: Self-pay | Admitting: Family Medicine

## 2017-01-17 ENCOUNTER — Ambulatory Visit: Payer: 59 | Admitting: Family Medicine

## 2017-01-17 ENCOUNTER — Other Ambulatory Visit: Payer: Self-pay | Admitting: *Deleted

## 2017-01-17 VITALS — BP 110/85 | HR 90 | Ht 61.5 in | Wt 151.0 lb

## 2017-01-17 DIAGNOSIS — M5416 Radiculopathy, lumbar region: Secondary | ICD-10-CM

## 2017-01-17 DIAGNOSIS — F418 Other specified anxiety disorders: Secondary | ICD-10-CM

## 2017-01-17 DIAGNOSIS — D721 Eosinophilia, unspecified: Secondary | ICD-10-CM

## 2017-01-17 DIAGNOSIS — M999 Biomechanical lesion, unspecified: Secondary | ICD-10-CM

## 2017-01-17 MED ORDER — VENLAFAXINE HCL ER 37.5 MG PO CP24: 37.5000 mg | ORAL_CAPSULE | Freq: Every day | ORAL | 1 refills | Status: DC

## 2017-01-17 MED ORDER — MEBENDAZOLE 100 MG PO CHEW: 100.0000 mg | CHEWABLE_TABLET | Freq: Two times a day (BID) | ORAL | 0 refills | Status: DC

## 2017-01-17 MED ORDER — MELOXICAM 15 MG PO TABS: 15.0000 mg | ORAL_TABLET | Freq: Every day | ORAL | 0 refills | Status: DC

## 2017-01-17 MED ORDER — FLUOXETINE HCL 20 MG PO TABS: 20.0000 mg | ORAL_TABLET | Freq: Every day | ORAL | 3 refills | Status: DC

## 2017-01-17 MED ORDER — IVERMECTIN 3 MG PO TABS: ORAL_TABLET | ORAL | 0 refills | Status: DC

## 2017-01-17 NOTE — Patient Instructions (Addendum)
Great to see you as always.  Your eosinophils are up again. Mebendazole for 5 days effexor 37.5 mg daily  Prozac now 20 mg OK to take xanax if needed See me again in 3-4 weeks Happy holidays!

## 2017-01-17 NOTE — Assessment & Plan Note (Signed)
Decision today to treat with OMT was based on Physical Exam  After verbal consent patient was treated with HVLA, ME, FPR techniques in cervical, thoracic, lumbar and sacral areas  Patient tolerated the procedure well with improvement in symptoms  Patient given exercises, stretches and lifestyle modifications  See medications in patient instructions if given  Patient will follow up in 4 weeks 

## 2017-01-17 NOTE — Assessment & Plan Note (Signed)
Patient does have some of the lumbar radiculopathy.  We discussed icing regimen and home exercises.  We discussed which activities are doing which wants to avoid.  Patient does not want to repeat any epidurals secondary to difficulties with her insurance previously.  Started manipulation again.  Follow-up again 3-4 weeks

## 2017-01-17 NOTE — Assessment & Plan Note (Signed)
Started Effexor at low dose.  Patient has been on Prozac for quite some time and is not made any improvement on.  Will monitor.  No suicidal or homicidal ideation.

## 2017-01-17 NOTE — Assessment & Plan Note (Signed)
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

## 2017-01-18 ENCOUNTER — Telehealth: Payer: Self-pay | Admitting: Internal Medicine

## 2017-01-18 NOTE — Telephone Encounter (Signed)
Copied from [place]. Topic: Quick Communication - See Telephone Encounter >> Jan 18, 2017 11:08 AM Corie Chiquito, NT wrote: CRM for notification. See Telephone encounter for: Patient called because she has questions about the three medication that were given to her yesterday Prozac, Effexor xr, and Vermox. A prior authorization is needed for theses medications so the pharmacy will give them to her.Patient also needs to know how to transition from her prozac to the other medication(patient can't remember name of) If someone could give her a call back at [phone]  01/18/17.

## 2017-01-18 NOTE — Telephone Encounter (Signed)
Prior  Authorization  Need . Medication  Tamara [name]

## 2017-01-18 NOTE — Telephone Encounter (Signed)
Forwarding msg to MD assistant to proceed.Marland Kitchenlmb

## 2017-01-18 NOTE — Telephone Encounter (Signed)
I decreased the prozac so should be fine.  Also taking it with the effexor with the lower dose will be fine.  Then the other medicine is onlt the one time dose.

## 2017-01-19 NOTE — Telephone Encounter (Signed)
Copied from [place]. Topic: Quick Communication - See Telephone Encounter >> Jan 18, 2017 11:08 AM Corie Chiquito, NT wrote: CRM for notification. See Telephone encounter for: Patient called because she has questions about the three medication that were given to her yesterday Prozac, Effexor xr, and Vermox. A prior authorization is needed for theses medications.Patient also needs to know how to trans from her prozac to the other medication If someone could give her a call back at [phone]  01/18/17.

## 2017-01-19 NOTE — Telephone Encounter (Signed)
Discussed with pt, she would like to know if her elevated eosinophils were elevated due to allergies since she had stopped taking her hydoxyzine.

## 2017-01-19 NOTE — Telephone Encounter (Signed)
Pt has been informed of Dr. Thompson Caul notations below and she expressed understanding.

## 2017-02-18 NOTE — Progress Notes (Signed)
[name] [address] [place], [place] [address] Phone: [phone] Subjective:    I'm seeing this patient by the request  of:    CC: Neck and thumb pain  [id number]  Tamara [name] is a 57 y.o. female coming in with complaint of neck and back pain.  Has been seen numerous times for this.  Has responded well to osteopathic manipulation.  Has known severe degenerative disc disease and has responded well to epidurals.  Patient though could not have the insurance cover them anymore so has not had anymore.  Having increasing discomfort and pain overall.  Patient is on Effexor at this time and seems to be doing relatively well.  Patient is also having left thumb pain.  Severe with certain range of motion open cans at this moment secondary to discomfort and pain.  Denies any numbness.  Rates the severity of pain is 7 out of 10.  Denies any true injury.  May be some mild swelling noted    Past Medical History:  Diagnosis Date  . Allergic rhinitis, mild   . Anemia   . Depression   . Hypertension   . Ovarian cyst    Past Surgical History:  Procedure Laterality Date  . LAMINECTOMY    . LUMBAR EPIDURAL INJECTION  11/03/2014   Social History   Socioeconomic History  . Marital status: Married    Spouse name: None  . Number of children: 0  . Years of education: None  . Highest education level: None  Social Needs  . Financial resource strain: None  . Food insecurity - worry: None  . Food insecurity - inability: None  . Transportation needs - medical: None  . Transportation needs - non-medical: None  Occupational History  . None  Tobacco Use  . Smoking status: Never Smoker  . Smokeless tobacco: Never Used  Substance and Sexual Activity  . Alcohol use: Yes    Alcohol/week: 1.8 oz    Types: 3 Standard drinks or equivalent per week  . Drug use: No  . Sexual activity: Yes    Partners: Male    Birth control/protection: IUD  Other Topics  Concern  . None  Social History Narrative  . None   Allergies  Allergen Reactions  . Gabapentin Swelling   Family History  Problem Relation Age of Onset  . Depression Mother   . Depression Father   . Alcohol abuse Father      Past medical history, social, surgical and family history all reviewed in electronic medical record.  No pertanent information unless stated regarding to the chief complaint.   Review of Systems:Review of systems updated and as accurate as of 02/20/17  No headache, visual changes, nausea, vomiting, diarrhea, constipation, dizziness, abdominal pain, skin rash, fevers, chills, night sweats, weight loss, swollen lymph nodes, body aches, joint swelling,  chest pain, shortness of breath, mood changes.  Positive muscle aches  Objective  Blood pressure 120/80, pulse (!) 103, height 5\' 1"  (1.549 m), weight 151 lb (68.5 kg), SpO2 98 %. Systems examined below as of 02/20/17   General: No apparent distress alert and oriented x3 mood and affect normal, dressed appropriately.  HEENT: Pupils equal, extraocular movements intact  Respiratory: Patient's speak in full sentences and does not appear short of breath  Cardiovascular: No lower extremity edema, non tender, no erythema  Skin: Warm dry intact with no signs of infection or rash on extremities or on axial skeleton.  Abdomen: Soft  nontender  Neuro: Cranial nerves II through XII are intact, neurovascularly intact in all extremities with 2+ DTRs and 2+ pulses.  Lymph: No lymphadenopathy of posterior or anterior cervical chain or axillae bilaterally.  Gait normal with good balance and coordination.  MSK:  Non tender with full range of motion and good stability and symmetric strength and tone of shoulders, elbows, wrist, hip, knee and ankles bilaterally.  Neck: Inspection mild loss of lordosis. No palpable stepoffs. Negative Spurling's maneuver. Mild limitation of 5 degrees in all planes Grip strength and sensation  normal in bilateral hands Strength good C4 to T1 distribution No sensory change to C4 to T1 Negative Hoffman sign bilaterally Reflexes normal  Back Exam:  Inspection: Mild loss of lordosis Motion: Flexion 45 deg, Extension 25 deg, Side Bending to 35 deg bilaterally,  Rotation to 35 deg bilaterally  SLR laying: Negative  XSLR laying: Negative  Palpable tenderness: Tender to palpation in the paraspinal musculature lumbar spine more pain also in the cervical thoracic spine. FABER: negative. Sensory change: Gross sensation intact to all lumbar and sacral dermatomes.  Reflexes: 2+ at both patellar tendons, 2+ at achilles tendons, Babinski's downgoing.  Strength at foot  Plantar-flexion: 5/5 Dorsi-flexion: 5/5 Eversion: 5/5 Inversion: 5/5  Leg strength  Quad: 5/5 Hamstring: 5/5 Hip flexor: 5/5 Hip abductors: 5/5  Gait unremarkable.  Left hand exam shows the patient does have some mild swelling in the thenar eminence.  Positive grind test.  Negative Finkelstein's.  Negative Tinel sign good grip strength.  Neurovascularly intact.    Osteopathic findings C2 flexed rotated and side bent right C4 flexed rotated and side bent left C6 flexed rotated and side bent left T3 extended rotated and side bent right inhaled third rib T9 extended rotated and side bent left L2 flexed rotated and side bent right Sacrum right on right  Procedure note Left thumb was prepped with alcohol swabs.  Patient then was injected with a total of 0.5 cc of 0.5% Marcaine and 0.5 cc of Kenalog 40 mg/dL.  This is into the left [facility] joint.  25-gauge half inch needle used.  No blood loss.  Postinjection instructions given.  Impression and Recommendations:     This case required medical decision making of moderate complexity.      Note: This dictation was prepared with Dragon dictation along with smaller phrase technology. Any transcriptional errors that result from this process are unintentional.

## 2017-02-20 ENCOUNTER — Encounter: Payer: Self-pay | Admitting: Family Medicine

## 2017-02-20 ENCOUNTER — Ambulatory Visit: Payer: 59 | Admitting: Family Medicine

## 2017-02-20 VITALS — BP 120/80 | HR 103 | Ht 61.0 in | Wt 151.0 lb

## 2017-02-20 DIAGNOSIS — M189 Osteoarthritis of first carpometacarpal joint, unspecified: Secondary | ICD-10-CM | POA: Insufficient documentation

## 2017-02-20 DIAGNOSIS — M48061 Spinal stenosis, lumbar region without neurogenic claudication: Secondary | ICD-10-CM | POA: Diagnosis not present

## 2017-02-20 DIAGNOSIS — M999 Biomechanical lesion, unspecified: Secondary | ICD-10-CM | POA: Diagnosis not present

## 2017-02-20 DIAGNOSIS — M1812 Unilateral primary osteoarthritis of first carpometacarpal joint, left hand: Secondary | ICD-10-CM

## 2017-02-20 MED ORDER — VITAMIN D (ERGOCALCIFEROL) 1.25 MG (50000 UNIT) PO CAPS: 50000.0000 [IU] | ORAL_CAPSULE | ORAL | 0 refills | Status: DC

## 2017-02-20 NOTE — Assessment & Plan Note (Signed)
Patient given injection.  Tolerated the injection well.  Discussed icing regimen, home exercises, which activities of doing which wants to avoid.  We discussed possible bracing at night.  Given the injection alone hopefully will respond.

## 2017-02-20 NOTE — Assessment & Plan Note (Signed)
Discussed with patient in great length.  Has been responding fairly well to conservative therapy at this time.  Patient did do well with osteopathic manipulation previously and will start this again regularly every 4 weeks.  We discussed icing regimen, core strengthening, patient will consider formal physical therapy.  Follow-up again in 4 weeks

## 2017-02-20 NOTE — Patient Instructions (Signed)
Good to see you  Ice is your friend Start vitamin D again weekly for 12 weeks I hope the injection helps See me again in 4 weeks

## 2017-02-20 NOTE — Assessment & Plan Note (Signed)
Decision today to treat with OMT was based on Physical Exam  After verbal consent patient was treated with HVLA, ME, FPR techniques in cervical, thoracic, lumbar and sacral areas  Patient tolerated the procedure well with improvement in symptoms  Patient given exercises, stretches and lifestyle modifications  See medications in patient instructions if given  Patient will follow up in 4 weeks 

## 2017-03-14 ENCOUNTER — Other Ambulatory Visit: Payer: Self-pay | Admitting: Family Medicine

## 2017-03-14 NOTE — Telephone Encounter (Signed)
Refill done.  

## 2017-03-16 ENCOUNTER — Other Ambulatory Visit: Payer: Self-pay | Admitting: Internal Medicine

## 2017-03-16 NOTE — Telephone Encounter (Signed)
Done erx 

## 2017-03-17 ENCOUNTER — Ambulatory Visit: Payer: 59 | Admitting: Family Medicine

## 2017-03-17 NOTE — Progress Notes (Deleted)
[name] [place], [place] [address] Phone: [phone] Subjective:    I'm seeing this patient by the request  of:    CC:   [id number]  Tamara [name] is a 57 y.o. female coming in with complaint of ***  Onset-  Location Duration-  Character- Aggravating factors- Reliving factors-  Therapies tried-  Severity-     Past Medical History:  Diagnosis Date  . Allergic rhinitis, mild   . Anemia   . Depression   . Hypertension   . Ovarian cyst    Past Surgical History:  Procedure Laterality Date  . LAMINECTOMY    . LUMBAR EPIDURAL INJECTION  11/03/2014   Social History   Socioeconomic History  . Marital status: Married    Spouse name: Not on file  . Number of children: 0  . Years of education: Not on file  . Highest education level: Not on file  Social Needs  . Financial resource strain: Not on file  . Food insecurity - worry: Not on file  . Food insecurity - inability: Not on file  . Transportation needs - medical: Not on file  . Transportation needs - non-medical: Not on file  Occupational History  . Not on file  Tobacco Use  . Smoking status: Never Smoker  . Smokeless tobacco: Never Used  Substance and Sexual Activity  . Alcohol use: Yes    Alcohol/week: 1.8 oz    Types: 3 Standard drinks or equivalent per week  . Drug use: No  . Sexual activity: Yes    Partners: Male    Birth control/protection: IUD  Other Topics Concern  . Not on file  Social History Narrative  . Not on file   Allergies  Allergen Reactions  . Gabapentin Swelling   Family History  Problem Relation Age of Onset  . Depression Mother   . Depression Father   . Alcohol abuse Father      Past medical history, social, surgical and family history all reviewed in electronic medical record.  No pertanent information unless stated regarding to the chief complaint.   Review of Systems:Review of systems updated and as accurate as  of 03/17/17  No headache, visual changes, nausea, vomiting, diarrhea, constipation, dizziness, abdominal pain, skin rash, fevers, chills, night sweats, weight loss, swollen lymph nodes, body aches, joint swelling, muscle aches, chest pain, shortness of breath, mood changes.   Objective  There were no vitals taken for this visit. Systems examined below as of 03/17/17   General: No apparent distress alert and oriented x3 mood and affect normal, dressed appropriately.  HEENT: Pupils equal, extraocular movements intact  Respiratory: Patient's speak in full sentences and does not appear short of breath  Cardiovascular: No lower extremity edema, non tender, no erythema  Skin: Warm dry intact with no signs of infection or rash on extremities or on axial skeleton.  Abdomen: Soft nontender  Neuro: Cranial nerves II through XII are intact, neurovascularly intact in all extremities with 2+ DTRs and 2+ pulses.  Lymph: No lymphadenopathy of posterior or anterior cervical chain or axillae bilaterally.  Gait normal with good balance and coordination.  MSK:  Non tender with full range of motion and good stability and symmetric strength and tone of shoulders, elbows, wrist, hip, knee and ankles bilaterally.     Impression and Recommendations:     This case required medical decision making of moderate complexity.      Note: This dictation was  prepared with Dragon dictation along with smaller phrase technology. Any transcriptional errors that result from this process are unintentional.

## 2017-03-20 NOTE — Progress Notes (Signed)
[name] [address] [place], [place] [address] Phone: [phone] Subjective:    :    CC: Back pain  [id number]  Tamara [name] is a 57 y.o. female coming in with complaint of back pain. She did get a new mattress and states that she has been doing much better since then.  Patient has had difficulty with back pain and neck pain previously.  Feels that the [facility] joint is significantly improved.  Patient is now on Effexor as well and decreased the Prozac.  Patient states doing relatively well but having tightness.       Past Medical History:  Diagnosis Date  . Allergic rhinitis, mild   . Anemia   . Depression   . Hypertension   . Ovarian cyst    Past Surgical History:  Procedure Laterality Date  . LAMINECTOMY    . LUMBAR EPIDURAL INJECTION  11/03/2014   Social History   Socioeconomic History  . Marital status: Married    Spouse name: None  . Number of children: 0  . Years of education: None  . Highest education level: None  Social Needs  . Financial resource strain: None  . Food insecurity - worry: None  . Food insecurity - inability: None  . Transportation needs - medical: None  . Transportation needs - non-medical: None  Occupational History  . None  Tobacco Use  . Smoking status: Never Smoker  . Smokeless tobacco: Never Used  Substance and Sexual Activity  . Alcohol use: Yes    Alcohol/week: 1.8 oz    Types: 3 Standard drinks or equivalent per week  . Drug use: No  . Sexual activity: Yes    Partners: Male    Birth control/protection: IUD  Other Topics Concern  . None  Social History Narrative  . None   Allergies  Allergen Reactions  . Gabapentin Swelling   Family History  Problem Relation Age of Onset  . Depression Mother   . Depression Father   . Alcohol abuse Father      Past medical history, social, surgical and family history all reviewed in electronic medical record.  No pertanent information  unless stated regarding to the chief complaint.   Review of Systems:Review of systems updated and as accurate as of 03/21/17  No headache, visual changes, nausea, vomiting, diarrhea, constipation, dizziness, abdominal pain, skin rash, fevers, chills, night sweats, weight loss, swollen lymph nodes, body aches, joint swelling,  chest pain, shortness of breath, mood changes.  Positive muscle aches  Objective  Blood pressure 110/76, pulse (!) 113, height 5' 1.5" (1.562 m), weight 151 lb (68.5 kg), SpO2 97 %. Systems examined below as of 03/21/17   General: No apparent distress alert and oriented x3 mood and affect normal, dressed appropriately.  HEENT: Pupils equal, extraocular movements intact  Respiratory: Patient's speak in full sentences and does not appear short of breath  Cardiovascular: No lower extremity edema, non tender, no erythema  Skin: Warm dry intact with no signs of infection or rash on extremities or on axial skeleton.  Abdomen: Soft nontender  Neuro: Cranial nerves II through XII are intact, neurovascularly intact in all extremities with 2+ DTRs and 2+ pulses.  Lymph: No lymphadenopathy of posterior or anterior cervical chain or axillae bilaterally.  Gait normal with good balance and coordination.  MSK:  Non tender with full range of motion and good stability and symmetric strength and tone of shoulders, elbows, wrist, hip, knee and ankles  bilaterally.  Neck: Inspection mild loss of lordosis. No palpable stepoffs. Negative Spurling's maneuver. Decreasing range of motion lacking last 5-10 degrees in all planes. Grip strength and sensation normal in bilateral hands Strength good C4 to T1 distribution No sensory change to C4 to T1 Negative Hoffman sign bilaterally Reflexes normal  Back Exam:  Inspection: Unremarkable  Motion: Flexion 40 deg, Extension 25 deg, Side Bending to 35 deg bilaterally,  Rotation to 35 deg bilaterally  SLR laying: Negative  XSLR laying: Negative    Palpable tenderness: Tender to palpation the paraspinal musculature right greater than left. FABER: Tightness on the right. Sensory change: Gross sensation intact to all lumbar and sacral dermatomes.  Reflexes: 2+ at both patellar tendons, 2+ at achilles tendons, Babinski's downgoing.  Strength at foot  Plantar-flexion: 5/5 Dorsi-flexion: 5/5 Eversion: 5/5 Inversion: 5/5  Leg strength  Quad: 5/5 Hamstring: 5/5 Hip flexor: 5/5 Hip abductors: 5/5  Gait unremarkable.  Osteopathic findings C2 flexed rotated and side bent right C4 flexed rotated and side bent left C6 flexed rotated and side bent left T3 extended rotated and side bent right inhaled third rib T6 extended rotated and side bent left L2 flexed rotated and side bent right Sacrum right on right    Impression and Recommendations:     This case required medical decision making of moderate complexity.      Note: This dictation was prepared with Dragon dictation along with smaller phrase technology. Any transcriptional errors that result from this process are unintentional.

## 2017-03-21 ENCOUNTER — Encounter: Payer: Self-pay | Admitting: Family Medicine

## 2017-03-21 ENCOUNTER — Ambulatory Visit: Payer: 59 | Admitting: Family Medicine

## 2017-03-21 VITALS — BP 110/76 | HR 113 | Ht 61.5 in | Wt 151.0 lb

## 2017-03-21 DIAGNOSIS — M5416 Radiculopathy, lumbar region: Secondary | ICD-10-CM | POA: Diagnosis not present

## 2017-03-21 DIAGNOSIS — M999 Biomechanical lesion, unspecified: Secondary | ICD-10-CM

## 2017-03-21 NOTE — Assessment & Plan Note (Signed)
Decision today to treat with OMT was based on Physical Exam  After verbal consent patient was treated with HVLA, ME, FPR techniques in cervical, thoracic, lumbar and sacral areas  Patient tolerated the procedure well with improvement in symptoms  Patient given exercises, stretches and lifestyle modifications  See medications in patient instructions if given  Patient will follow up in 4 weeks 

## 2017-03-21 NOTE — Patient Instructions (Signed)
Good to see you  I am printing the last note but appears to have the right coding.  Ice is your friend Non contour memory foam firm pillow Keep up with the exercises I am glad you are taking some time for yourself.

## 2017-03-21 NOTE — Assessment & Plan Note (Signed)
No radicular symptoms at this time.  Discussed icing regimen.  Discussed core strength.  Patient will try to do the exercises on a more regular basis.  I would not make any other drastic changes in the medications at this time.  We will consider decreasing Prozac and increase in Effexor at next visit.  Follow-up again in 4 weeks

## 2017-04-25 ENCOUNTER — Ambulatory Visit: Payer: 59 | Admitting: Family Medicine

## 2017-05-01 NOTE — Progress Notes (Signed)
[name] [address] [place], [place] [address] Phone: [phone] Subjective:     CC: Neck pain follow-up  [id number]  Tamara [name] is a 57 y.o. female coming in with complaint of back pain. Patient has been having neck pain. She is looking into changing her pillow. She has pain with flexion in the mornings.  Patient states that there is no radiation down the arms or any shoulder pain which patient has had previously.  Known to have degenerative disc disease of the neck as well as lumbar spine.     Past Medical History:  Diagnosis Date  . Allergic rhinitis, mild   . Anemia   . Depression   . Hypertension   . Ovarian cyst    Past Surgical History:  Procedure Laterality Date  . LAMINECTOMY    . LUMBAR EPIDURAL INJECTION  11/03/2014   Social History   Socioeconomic History  . Marital status: Married    Spouse name: Not on file  . Number of children: 0  . Years of education: Not on file  . Highest education level: Not on file  Social Needs  . Financial resource strain: Not on file  . Food insecurity - worry: Not on file  . Food insecurity - inability: Not on file  . Transportation needs - medical: Not on file  . Transportation needs - non-medical: Not on file  Occupational History  . Not on file  Tobacco Use  . Smoking status: Never Smoker  . Smokeless tobacco: Never Used  Substance and Sexual Activity  . Alcohol use: Yes    Alcohol/week: 1.8 oz    Types: 3 Standard drinks or equivalent per week  . Drug use: No  . Sexual activity: Yes    Partners: Male    Birth control/protection: IUD  Other Topics Concern  . Not on file  Social History Narrative  . Not on file   Allergies  Allergen Reactions  . Gabapentin Swelling   Family History  Problem Relation Age of Onset  . Depression Mother   . Depression Father   . Alcohol abuse Father      Past medical history, social, surgical and family history all reviewed  in electronic medical record.  No pertanent information unless stated regarding to the chief complaint.   Review of Systems:Review of systems updated and as accurate as of 05/02/17  No headache, visual changes, nausea, vomiting, diarrhea, constipation, dizziness, abdominal pain, skin rash, fevers, chills, night sweats, weight loss, swollen lymph nodes, body aches, joint swelling, chest pain, shortness of breath, mood changes.  Positive muscle aches  Objective  Blood pressure 116/88, pulse (!) 102, weight 150 lb (68 kg), SpO2 98 %. Systems examined below as of 05/02/17   General: No apparent distress alert and oriented x3 mood and affect normal, dressed appropriately.  HEENT: Pupils equal, extraocular movements intact  Respiratory: Patient's speak in full sentences and does not appear short of breath  Cardiovascular: No lower extremity edema, non tender, no erythema  Skin: Warm dry intact with no signs of infection or rash on extremities or on axial skeleton.  Abdomen: Soft nontender  Neuro: Cranial nerves II through XII are intact, neurovascularly intact in all extremities with 2+ DTRs and 2+ pulses.  Lymph: No lymphadenopathy of posterior or anterior cervical chain or axillae bilaterally.  Gait normal with good balance and coordination.  MSK:  Non tender with full range of motion and good stability and symmetric strength and  tone of shoulders, elbows, wrist, hip, knee and ankles bilaterally.  Neck: Inspection mild to moderate limitation in all planes of 5-10 degrees. No palpable stepoffs. Negative Spurling's maneuver. Grip strength and sensation normal in bilateral hands Strength good C4 to T1 distribution No sensory change to C4 to T1 Negative Hoffman sign bilaterally Reflexes normal Significant tightness of the trapezius muscles bilaterally.  Osteopathic findings C2 flexed rotated and side bent right T3 extended rotated and side bent right inhaled third rib T9 extended rotated and  side bent left L4 flexed rotated and side bent right Sacrum right on right    Impression and Recommendations:     This case required medical decision making of moderate complexity.      Note: This dictation was prepared with Dragon dictation along with smaller phrase technology. Any transcriptional errors that result from this process are unintentional.

## 2017-05-02 ENCOUNTER — Ambulatory Visit: Payer: 59 | Admitting: Family Medicine

## 2017-05-02 ENCOUNTER — Encounter: Payer: Self-pay | Admitting: Family Medicine

## 2017-05-02 VITALS — BP 116/88 | HR 102 | Wt 150.0 lb

## 2017-05-02 DIAGNOSIS — M4722 Other spondylosis with radiculopathy, cervical region: Secondary | ICD-10-CM | POA: Diagnosis not present

## 2017-05-02 DIAGNOSIS — M999 Biomechanical lesion, unspecified: Secondary | ICD-10-CM | POA: Diagnosis not present

## 2017-05-02 MED ORDER — VENLAFAXINE HCL ER 75 MG PO CP24: 75.0000 mg | ORAL_CAPSULE | Freq: Every day | ORAL | 3 refills | Status: DC

## 2017-05-02 MED ORDER — HYDROXYZINE HCL 10 MG PO TABS: 10.0000 mg | ORAL_TABLET | Freq: Three times a day (TID) | ORAL | 3 refills | Status: DC | PRN

## 2017-05-02 NOTE — Assessment & Plan Note (Signed)
Known arthritic changes.  No radicular symptoms.  Discussed icing regimen.  Patient increase Effexor to 75 mg.  We will discontinue the Prozac.  Responded well to osteopathic manipulation.  Follow-up again in 4 weeks

## 2017-05-02 NOTE — Assessment & Plan Note (Signed)
Decision today to treat with OMT was based on Physical Exam  After verbal consent patient was treated with HVLA, ME, FPR techniques in cervical, thoracic, lumbar and sacral areas  Patient tolerated the procedure well with improvement in symptoms  Patient given exercises, stretches and lifestyle modifications  See medications in patient instructions if given  Patient will follow up in 4 weeks 

## 2017-05-02 NOTE — Patient Instructions (Signed)
Good to see you  Posture is key  Keep working at it  Effexor now 75 mg  Stop the prozac Hydroxyzine refilled See me again in 4-6 weeks!

## 2017-05-11 ENCOUNTER — Other Ambulatory Visit: Payer: Self-pay | Admitting: Family Medicine

## 2017-05-11 NOTE — Telephone Encounter (Signed)
Refill done.  

## 2017-05-15 NOTE — Progress Notes (Signed)
[name] [place] [place], [place] [address] Phone: [phone] Subjective:    I'm seeing this patient by the request  of:    CC: Neck and back pain follow-up, left thumb pain  [id number]  Tamara [name] is a 57 y.o. female coming in with complaint of neck and back pain. She said that the neck feels the same as last visit.  Has responded well to manipulation in the past.  Having worsening CMC pain.  She said that her left hand and thumb are  bothering her. Constant, sharp pain with use and to palpation.  Known to have [place] arthritis.  Has been given injections previously.  Worsening pain.  Discussed icing regimen and home exercises.  Has not been doing them regularly secondary to the pain       Past Medical History:  Diagnosis Date  . Allergic rhinitis, mild   . Anemia   . Depression   . Hypertension   . Ovarian cyst    Past Surgical History:  Procedure Laterality Date  . LAMINECTOMY    . LUMBAR EPIDURAL INJECTION  11/03/2014   Social History   Socioeconomic History  . Marital status: Married    Spouse name: Not on file  . Number of children: 0  . Years of education: Not on file  . Highest education level: Not on file  Occupational History  . Not on file  Social Needs  . Financial resource strain: Not on file  . Food insecurity:    Worry: Not on file    Inability: Not on file  . Transportation needs:    Medical: Not on file    Non-medical: Not on file  Tobacco Use  . Smoking status: Never Smoker  . Smokeless tobacco: Never Used  Substance and Sexual Activity  . Alcohol use: Yes    Alcohol/week: 1.8 oz    Types: 3 Standard drinks or equivalent per week  . Drug use: No  . Sexual activity: Yes    Partners: Male    Birth control/protection: IUD  Lifestyle  . Physical activity:    Days per week: Not on file    Minutes per session: Not on file  . Stress: Not on file  Relationships  . Social connections:   Talks on phone: Not on file    Gets together: Not on file    Attends religious service: Not on file    Active member of club or organization: Not on file    Attends meetings of clubs or organizations: Not on file    Relationship status: Not on file  Other Topics Concern  . Not on file  Social History Narrative  . Not on file   Allergies  Allergen Reactions  . Gabapentin Swelling   Family History  Problem Relation Age of Onset  . Depression Mother   . Depression Father   . Alcohol abuse Father      Past medical history, social, surgical and family history all reviewed in electronic medical record.  No pertanent information unless stated regarding to the chief complaint.   Review of Systems:Review of systems updated and as accurate as of 05/16/17  No headache, visual changes, nausea, vomiting, diarrhea, constipation, dizziness, abdominal pain, skin rash, fevers, chills, night sweats, weight loss, swollen lymph nodes,, muscle aches, chest pain, shortness of breath, mood changes.  Positive joint swelling in the left thumb mild positive body aches  Objective  Blood pressure 122/84, pulse 100, height  5' 1.5" (1.562 m), weight 149 lb (67.6 kg), SpO2 98 %. Systems examined below as of 05/16/17   General: No apparent distress alert and oriented x3 mood and affect normal, dressed appropriately.  HEENT: Pupils equal, extraocular movements intact  Respiratory: Patient's speak in full sentences and does not appear short of breath  Cardiovascular: No lower extremity edema, non tender, no erythema  Skin: Warm dry intact with no signs of infection or rash on extremities or on axial skeleton.  Abdomen: Soft nontender  Neuro: Cranial nerves II through XII are intact, neurovascularly intact in all extremities with 2+ DTRs and 2+ pulses.  Lymph: No lymphadenopathy of posterior or anterior cervical chain or axillae bilaterally.  Gait normal with good balance and coordination.  MSK:  Non tender  with full range of motion and good stability and symmetric strength and tone of shoulders, elbows, wrist, hip, knee and ankles bilaterally.  Neck exam shows loss of lordosis.  Mild loss of range of motion.  I can last 5-10 degrees of rotation bilaterally.  Mild crepitus noted.  Good grip strength except the left hand with pain over the [facility] joint with positive grind test.  Procedure: Real-time Ultrasound Guided Injection of left CMC joint Device: GE Logiq Q7 Ultrasound guided injection is preferred based studies that show increased duration, increased effect, greater accuracy, decreased procedural pain, increased response rate, and decreased cost with ultrasound guided versus blind injection.  Verbal informed consent obtained.  Time-out conducted.  Noted no overlying erythema, induration, or other signs of local infection.  Skin prepped in a sterile fashion.  Local anesthesia: Topical Ethyl chloride.  With sterile technique and under real time ultrasound guidance: With a 25-gauge half inch needle injected with a total of 0.5 cc of 0.5% Marcaine and 0.5 cc of Kenalog 40 mg/mL. Completed without difficulty  Pain immediately resolved suggesting accurate placement of the medication.  Advised to call if fevers/chills, erythema, induration, drainage, or persistent bleeding.  Images permanently stored and available for review in the ultrasound unit.  Impression: Technically successful ultrasound guided injection.   Impression and Recommendations:     This case required medical decision making of moderate complexity.      Note: This dictation was prepared with Dragon dictation along with smaller phrase technology. Any transcriptional errors that result from this process are unintentional.

## 2017-05-16 ENCOUNTER — Encounter: Payer: Self-pay | Admitting: Family Medicine

## 2017-05-16 ENCOUNTER — Ambulatory Visit: Payer: Self-pay

## 2017-05-16 ENCOUNTER — Ambulatory Visit: Payer: 59 | Admitting: Family Medicine

## 2017-05-16 VITALS — BP 122/84 | HR 100 | Ht 61.5 in | Wt 149.0 lb

## 2017-05-16 DIAGNOSIS — M25532 Pain in left wrist: Secondary | ICD-10-CM

## 2017-05-16 DIAGNOSIS — M1812 Unilateral primary osteoarthritis of first carpometacarpal joint, left hand: Secondary | ICD-10-CM | POA: Diagnosis not present

## 2017-05-16 NOTE — Assessment & Plan Note (Signed)
Repeat injection given today.  Discussed icing regimen and home exercises.  Discussed which activities to do which wants to avoid.  Increase activity as tolerated.  Patient can do bracing at night.  Follow-up again in 4 weeks

## 2017-05-17 ENCOUNTER — Other Ambulatory Visit: Payer: Self-pay | Admitting: Family Medicine

## 2017-05-29 NOTE — Progress Notes (Signed)
[name] [address] [place], [place] [address] Phone: [phone] Subjective:    CC: Left wrist pain follow-up, neck pain follow-up  [id number]  Tamara [name] is a 57 y.o. female coming in with complaint of left wrist pain. She is feeling better. Opening a door this morning did bother her. She does have throbbing pain throughout the day intermittently.  Does have [place] arthritis.  Given injection at last follow-up.  Doing better overall.  Patient also has known degenerative disc disease of the cervical and lumbar regions in the back.  Has responded fairly well to osteopathic manipulation.  Also responds well to epidurals but has difficulty with insurance covering them.  Patient states some mild soreness.  Has been doing other movement of multiple heavy objects recently that seems to be exacerbated.  No radiation down the legs or any numbness or tingling.      Past Medical History:  Diagnosis Date  . Allergic rhinitis, mild   . Anemia   . Depression   . Hypertension   . Ovarian cyst    Past Surgical History:  Procedure Laterality Date  . LAMINECTOMY    . LUMBAR EPIDURAL INJECTION  11/03/2014   Social History   Socioeconomic History  . Marital status: Married    Spouse name: Not on file  . Number of children: 0  . Years of education: Not on file  . Highest education level: Not on file  Occupational History  . Not on file  Social Needs  . Financial resource strain: Not on file  . Food insecurity:    Worry: Not on file    Inability: Not on file  . Transportation needs:    Medical: Not on file    Non-medical: Not on file  Tobacco Use  . Smoking status: Never Smoker  . Smokeless tobacco: Never Used  Substance and Sexual Activity  . Alcohol use: Yes    Alcohol/week: 1.8 oz    Types: 3 Standard drinks or equivalent per week  . Drug use: No  . Sexual activity: Yes    Partners: Male    Birth control/protection: IUD  Lifestyle    . Physical activity:    Days per week: Not on file    Minutes per session: Not on file  . Stress: Not on file  Relationships  . Social connections:    Talks on phone: Not on file    Gets together: Not on file    Attends religious service: Not on file    Active member of club or organization: Not on file    Attends meetings of clubs or organizations: Not on file    Relationship status: Not on file  Other Topics Concern  . Not on file  Social History Narrative  . Not on file   Allergies  Allergen Reactions  . Gabapentin Swelling   Family History  Problem Relation Age of Onset  . Depression Mother   . Depression Father   . Alcohol abuse Father      Past medical history, social, surgical and family history all reviewed in electronic medical record.  No pertanent information unless stated regarding to the chief complaint.   Review of Systems:Review of systems updated and as accurate as of 05/31/17  No headache, visual changes, nausea, vomiting, diarrhea, constipation, dizziness, abdominal pain, skin rash, fevers, chills, night sweats, weight loss, swollen lymph nodes, body aches, joint swelling, chest pain, shortness of breath, mood changes.  Positive muscle  aches  Objective  Blood pressure 124/82, pulse 92, height 5' 1.5" (1.562 m), weight 150 lb (68 kg), SpO2 98 %. Systems examined below as of 05/31/17   General: No apparent distress alert and oriented x3 mood and affect normal, dressed appropriately.  HEENT: Pupils equal, extraocular movements intact  Respiratory: Patient's speak in full sentences and does not appear short of breath  Cardiovascular: No lower extremity edema, non tender, no erythema  Skin: Warm dry intact with no signs of infection or rash on extremities or on axial skeleton.  Abdomen: Soft nontender  Neuro: Cranial nerves II through XII are intact, neurovascularly intact in all extremities with 2+ DTRs and 2+ pulses.  Lymph: No lymphadenopathy of  posterior or anterior cervical chain or axillae bilaterally.  Gait normal with good balance and coordination.  MSK:  Non tender with full range of motion and good stability and symmetric strength and tone of shoulders, elbows, wrist, hip, knee and ankles bilaterally.  Neck: Inspection unremarkable. No palpable stepoffs. Negative Spurling's maneuver. Mild loss of range of motion Grip strength and sensation normal in bilateral hands Strength good C4 to T1 distribution No sensory change to C4 to T1 Negative Hoffman sign bilaterally Reflexes normal   tightness of the trapezius bilaterally.  Low back did show some loss of lordosis.  Mild loss of 5-10 degrees in movement in all planes.  Positive Faber bilaterally.  Negative straight leg test.  Tenderness in the paraspinal musculature.  Strength symmetric in the lower extremities  Osteopathic findings C2 flexed rotated and side bent right C4 flexed rotated and side bent left C7 flexed rotated and side bent left T3 extended rotated and side bent right inhaled third rib T7 extended rotated and side bent left L3 flexed rotated and side bent right Sacrum right on right     Impression and Recommendations:     This case required medical decision making of moderate complexity.      Note: This dictation was prepared with Dragon dictation along with smaller phrase technology. Any transcriptional errors that result from this process are unintentional.

## 2017-05-30 ENCOUNTER — Encounter: Payer: Self-pay | Admitting: Family Medicine

## 2017-05-30 ENCOUNTER — Ambulatory Visit: Payer: 59 | Admitting: Family Medicine

## 2017-05-30 VITALS — BP 124/82 | HR 92 | Ht 61.5 in | Wt 150.0 lb

## 2017-05-30 DIAGNOSIS — M999 Biomechanical lesion, unspecified: Secondary | ICD-10-CM

## 2017-05-30 DIAGNOSIS — M25532 Pain in left wrist: Secondary | ICD-10-CM | POA: Diagnosis not present

## 2017-05-30 DIAGNOSIS — M549 Dorsalgia, unspecified: Secondary | ICD-10-CM | POA: Diagnosis not present

## 2017-05-30 DIAGNOSIS — M1812 Unilateral primary osteoarthritis of first carpometacarpal joint, left hand: Secondary | ICD-10-CM | POA: Diagnosis not present

## 2017-05-30 NOTE — Patient Instructions (Signed)
Good to see you  Try to wear the brace day and night for 2 weeks Then nightly for 2 weeks Posture is key  See me again in 4-6 weeks

## 2017-05-31 NOTE — Assessment & Plan Note (Signed)
Decision today to treat with OMT was based on Physical Exam  After verbal consent patient was treated with HVLA, ME, FPR techniques in cervical, thoracic, lumbar and sacral areas  Patient tolerated the procedure well with improvement in symptoms  Patient given exercises, stretches and lifestyle modifications  See medications in patient instructions if given  Patient will follow up in 4-6 weeks 

## 2017-05-31 NOTE — Assessment & Plan Note (Signed)
More posture and anxiety than anything else.  Patient encouraged to continue the same medications.  Has had difficulty in the past.  Discussed icing regimen and home exercises.  Patient will follow up with me again in 4-6 weeks

## 2017-06-28 NOTE — Progress Notes (Signed)
[name] [address] [place], [place] [address] Phone: [phone] Subjective:    I'm seeing this patient by the request  of:    CC: Back and neck pain follow-up  [id number]  Tamara [name] is a 57 y.o. female coming in with complaint of back pain. She states that her back is doing well but that her neck has been bothering her a bit.  Patient does have known degenerative disc disease.  Has responded to epidurals previously.  Is making progress at this point.  Feeling little bit better.  Just had a week at the beach which seemed to help.  Doing the exercises intermittently.      Past Medical History:  Diagnosis Date  . Allergic rhinitis, mild   . Anemia   . Depression   . Hypertension   . Ovarian cyst    Past Surgical History:  Procedure Laterality Date  . LAMINECTOMY    . LUMBAR EPIDURAL INJECTION  11/03/2014   Social History   Socioeconomic History  . Marital status: Married    Spouse name: Not on file  . Number of children: 0  . Years of education: Not on file  . Highest education level: Not on file  Occupational History  . Not on file  Social Needs  . Financial resource strain: Not on file  . Food insecurity:    Worry: Not on file    Inability: Not on file  . Transportation needs:    Medical: Not on file    Non-medical: Not on file  Tobacco Use  . Smoking status: Never Smoker  . Smokeless tobacco: Never Used  Substance and Sexual Activity  . Alcohol use: Yes    Alcohol/week: 1.8 oz    Types: 3 Standard drinks or equivalent per week  . Drug use: No  . Sexual activity: Yes    Partners: Male    Birth control/protection: IUD  Lifestyle  . Physical activity:    Days per week: Not on file    Minutes per session: Not on file  . Stress: Not on file  Relationships  . Social connections:    Talks on phone: Not on file    Gets together: Not on file    Attends religious service: Not on file    Active member of club  or organization: Not on file    Attends meetings of clubs or organizations: Not on file    Relationship status: Not on file  Other Topics Concern  . Not on file  Social History Narrative  . Not on file   Allergies  Allergen Reactions  . Gabapentin Swelling   Family History  Problem Relation Age of Onset  . Depression Mother   . Depression Father   . Alcohol abuse Father      Past medical history, social, surgical and family history all reviewed in electronic medical record.  No pertanent information unless stated regarding to the chief complaint.   Review of Systems:Review of systems updated and as accurate as of 06/29/17  No headache, visual changes, nausea, vomiting, diarrhea, constipation, dizziness, abdominal pain, skin rash, fevers, chills, night sweats, weight loss, swollen lymph nodes, body aches, joint swelling,  chest pain, shortness of breath, mood changes.  Positive muscle aches  Objective  Blood pressure 122/84, pulse (!) 106, height 5' 1.5" (1.562 m), weight 155 lb (70.3 kg), SpO2 93 %. Systems examined below as of 06/29/17   General: No apparent distress alert and  oriented x3 mood and affect normal, dressed appropriately.  HEENT: Pupils equal, extraocular movements intact  Respiratory: Patient's speak in full sentences and does not appear short of breath  Cardiovascular: No lower extremity edema, non tender, no erythema  Skin: Warm dry intact with no signs of infection or rash on extremities or on axial skeleton.  Abdomen: Soft nontender  Neuro: Cranial nerves II through XII are intact, neurovascularly intact in all extremities with 2+ DTRs and 2+ pulses.  Lymph: No lymphadenopathy of posterior or anterior cervical chain or axillae bilaterally.  Gait normal with good balance and coordination.  MSK:  Non tender with full range of motion and good stability and symmetric strength and tone of shoulders, elbows, wrist, hip, knee and ankles bilaterally.   Neck: Inspection loss of lordosis. No palpable stepoffs. Negative Spurling's maneuver. Mild limited range of motion lacking last 5 degrees of sidebending bilaterally in the last 5 degrees of extension Grip strength and sensation normal in bilateral hands Strength good C4 to T1 distribution No sensory change to C4 to T1 Negative Hoffman sign bilaterally Reflexes normal Tightness of the right trapezius  Back exam shows some mild loss of lordosis as well.  Tightness in all planes.  Positive Faber on the right.  Pain over the right sacroiliac joint  Osteopathic findings C2 flexed rotated and side bent right C4 flexed rotated and side bent left C7 flexed rotated and side bent left T3 extended rotated and side bent right inhaled third rib T6 extended rotated and side bent left L3 flexed rotated and side bent right Sacrum right on right     Impression and Recommendations:     This case required medical decision making of moderate complexity.      Note: This dictation was prepared with Dragon dictation along with smaller phrase technology. Any transcriptional errors that result from this process are unintentional.       '

## 2017-06-29 ENCOUNTER — Encounter: Payer: Self-pay | Admitting: Family Medicine

## 2017-06-29 ENCOUNTER — Ambulatory Visit: Payer: 59 | Admitting: Family Medicine

## 2017-06-29 VITALS — BP 122/84 | HR 106 | Ht 61.5 in | Wt 155.0 lb

## 2017-06-29 DIAGNOSIS — M999 Biomechanical lesion, unspecified: Secondary | ICD-10-CM | POA: Diagnosis not present

## 2017-06-29 DIAGNOSIS — M48061 Spinal stenosis, lumbar region without neurogenic claudication: Secondary | ICD-10-CM

## 2017-06-29 NOTE — Assessment & Plan Note (Signed)
Decision today to treat with OMT was based on Physical Exam  After verbal consent patient was treated with HVLA, ME, FPR techniques in cervical, thoracic, rib, lumbar and sacral areas  Patient tolerated the procedure well with improvement in symptoms  Patient given exercises, stretches and lifestyle modifications  See medications in patient instructions if given  Patient will follow up in 5 weeks

## 2017-06-29 NOTE — Assessment & Plan Note (Signed)
Stable.  Discussed icing regimen and home exercises.  Discussed which activities to doing which wants to avoid.  Increase activity as tolerated.  Patient has made some progress and will have patient follow-up again in 5 weeks

## 2017-06-29 NOTE — Patient Instructions (Signed)
Good to see you  You are doing better  See me again in 5 weeks

## 2017-07-27 ENCOUNTER — Encounter: Payer: Self-pay | Admitting: Radiology

## 2017-08-02 NOTE — Progress Notes (Signed)
[name] [address] [place], [place] [address] Phone: [phone] Subjective:    I'm seeing this patient by the request  of:    CC: Neck pain and back pain  [id number]  Tamara [name] is a 57 y.o. female coming in with complaint of neck and back pain. She had an increase in her pain and stiffness. She continues to also have wrist pain on the left side.  Found to have [place] arthritis previously.  Patient is having very mild pain in the thumb again.  Last injection was April 2.  Neck pain seems to be more tight.  Has been doing more looking down and filing at work which could be contributing.  Does not think she is having radiation down the arms.  Denies any fevers chills or any weakness of the upper extremities.      Past Medical History:  Diagnosis Date  . Allergic rhinitis, mild   . Anemia   . Depression   . Hypertension   . Ovarian cyst    Past Surgical History:  Procedure Laterality Date  . LAMINECTOMY    . LUMBAR EPIDURAL INJECTION  11/03/2014   Social History   Socioeconomic History  . Marital status: Married    Spouse name: Not on file  . Number of children: 0  . Years of education: Not on file  . Highest education level: Not on file  Occupational History  . Not on file  Social Needs  . Financial resource strain: Not on file  . Food insecurity:    Worry: Not on file    Inability: Not on file  . Transportation needs:    Medical: Not on file    Non-medical: Not on file  Tobacco Use  . Smoking status: Never Smoker  . Smokeless tobacco: Never Used  Substance and Sexual Activity  . Alcohol use: Yes    Alcohol/week: 1.8 oz    Types: 3 Standard drinks or equivalent per week  . Drug use: No  . Sexual activity: Yes    Partners: Male    Birth control/protection: IUD  Lifestyle  . Physical activity:    Days per week: Not on file    Minutes per session: Not on file  . Stress: Not on file  Relationships  . Social  connections:    Talks on phone: Not on file    Gets together: Not on file    Attends religious service: Not on file    Active member of club or organization: Not on file    Attends meetings of clubs or organizations: Not on file    Relationship status: Not on file  Other Topics Concern  . Not on file  Social History Narrative  . Not on file   Allergies  Allergen Reactions  . Gabapentin Swelling   Family History  Problem Relation Age of Onset  . Depression Mother   . Depression Father   . Alcohol abuse Father      Past medical history, social, surgical and family history all reviewed in electronic medical record.  No pertanent information unless stated regarding to the chief complaint.   Review of Systems:Review of systems updated and as accurate as of 08/03/17  No  visual changes, nausea, vomiting, diarrhea, constipation, dizziness, abdominal pain, skin rash, fevers, chills, night sweats, weight loss, swollen lymph nodes, body aches, joint swelling,  chest pain, shortness of breath, mood changes.  Positive muscle aches and headaches  Objective  Blood pressure 110/74, pulse (!) 102, height 5\' 1"  (1.549 m), weight 155 lb (70.3 kg), SpO2 97 %. Systems examined below as of 08/03/17   General: No apparent distress alert and oriented x3 mood and affect normal, dressed appropriately.  HEENT: Pupils equal, extraocular movements intact  Respiratory: Patient's speak in full sentences and does not appear short of breath  Cardiovascular: No lower extremity edema, non tender, no erythema  Skin: Warm dry intact with no signs of infection or rash on extremities or on axial skeleton.  Abdomen: Soft nontender  Neuro: Cranial nerves II through XII are intact, neurovascularly intact in all extremities with 2+ DTRs and 2+ pulses.  Lymph: No lymphadenopathy of posterior or anterior cervical chain or axillae bilaterally.  Gait normal with good balance and coordination.  MSK:  Non tender with full  range of motion and good stability and symmetric strength and tone of shoulders, elbows, wrist, hip, knee and ankles bilaterally.  Neck: Inspection loss of lordosis. No palpable stepoffs. Negative Spurling's maneuver. Mild limitation in range of motion Grip strength and sensation normal in bilateral hands Strength good C4 to T1 distribution No sensory change to C4 to T1 Negative Hoffman sign bilaterally Reflexes normal Tightness of the trapezius bilaterally  Osteopathic findings C2 flexed rotated and side bent right C4 flexed rotated and side bent left C6 flexed rotated and side bent left T3 extended rotated and side bent right inhaled third rib T9 extended rotated and side bent left L2 flexed rotated and side bent right Sacrum right on right    Impression and Recommendations:     This case required medical decision making of moderate complexity.      Note: This dictation was prepared with Dragon dictation along with smaller phrase technology. Any transcriptional errors that result from this process are unintentional.

## 2017-08-03 ENCOUNTER — Ambulatory Visit: Payer: 59 | Admitting: Family Medicine

## 2017-08-03 ENCOUNTER — Encounter: Payer: Self-pay | Admitting: Family Medicine

## 2017-08-03 VITALS — BP 110/74 | HR 102 | Ht 61.0 in | Wt 155.0 lb

## 2017-08-03 DIAGNOSIS — M999 Biomechanical lesion, unspecified: Secondary | ICD-10-CM | POA: Diagnosis not present

## 2017-08-03 DIAGNOSIS — M549 Dorsalgia, unspecified: Secondary | ICD-10-CM | POA: Diagnosis not present

## 2017-08-03 NOTE — Assessment & Plan Note (Signed)
Upper back and neck pain.  Known to have some degenerative disc disease.  We discussed icing regimen and home exercises.  Discussed which activities to do which wants to avoid.  Increase activity as tolerated.  Follow-up again with me in 4 to 8 weeks.

## 2017-08-03 NOTE — Patient Instructions (Signed)
Good to see you  Wish your friend does well  Lets watch the neck.  Quite tight  Continue everything else Weather may be playing a role  See me again in 4-6 weeks

## 2017-08-03 NOTE — Assessment & Plan Note (Signed)
Decision today to treat with OMT was based on Physical Exam  After verbal consent patient was treated with HVLA, ME, FPR techniques in cervical, thoracic, lumbar and sacral areas  Patient tolerated the procedure well with improvement in symptoms  Patient given exercises, stretches and lifestyle modifications  See medications in patient instructions if given  Patient will follow up in 4-8 weeks 

## 2017-08-06 ENCOUNTER — Other Ambulatory Visit: Payer: Self-pay | Admitting: Internal Medicine

## 2017-08-07 NOTE — Telephone Encounter (Signed)
07/05/2017 90# 

## 2017-08-07 NOTE — Telephone Encounter (Signed)
Done erx 

## 2017-08-31 ENCOUNTER — Other Ambulatory Visit: Payer: Self-pay | Admitting: Family Medicine

## 2017-08-31 NOTE — Telephone Encounter (Signed)
Refill done.  

## 2017-09-05 NOTE — Progress Notes (Signed)
[name] [name], [place] [address] Phone: [phone] Subjective:    I'm seeing this patient by the request  of:    CC: Neck pain and thumb pain  [id number]  Tamara [name] is a 57 y.o. female coming in with complaint of back pain. States she has some left hand pain on the thumb side today. Opening and buttoning things is very painful. States that she has bone spurs in her hand. Right had is starting to get worse. States that the back is doing very well.   Neck pain does have some degenerative disc disease.  Some tightness noted.  Some upper back pain that seems to be associated with it.  Seems to be when she is increasing stress.  Has had worsening pain in this previously though.   Thumb pain is [place] arthritis on the left side.  Worsening pain.  Nearly 4 months since last injection.  Affecting daily activities.  Wearing the brace at night intermittently    Past Medical History:  Diagnosis Date  . Allergic rhinitis, mild   . Anemia   . Depression   . Hypertension   . Ovarian cyst    Past Surgical History:  Procedure Laterality Date  . LAMINECTOMY    . LUMBAR EPIDURAL INJECTION  11/03/2014   Social History   Socioeconomic History  . Marital status: Married    Spouse name: Not on file  . Number of children: 0  . Years of education: Not on file  . Highest education level: Not on file  Occupational History  . Not on file  Social Needs  . Financial resource strain: Not on file  . Food insecurity:    Worry: Not on file    Inability: Not on file  . Transportation needs:    Medical: Not on file    Non-medical: Not on file  Tobacco Use  . Smoking status: Never Smoker  . Smokeless tobacco: Never Used  Substance and Sexual Activity  . Alcohol use: Yes    Alcohol/week: 1.8 oz    Types: 3 Standard drinks or equivalent per week  . Drug use: No  . Sexual activity: Yes    Partners: Male    Birth control/protection: IUD   Lifestyle  . Physical activity:    Days per week: Not on file    Minutes per session: Not on file  . Stress: Not on file  Relationships  . Social connections:    Talks on phone: Not on file    Gets together: Not on file    Attends religious service: Not on file    Active member of club or organization: Not on file    Attends meetings of clubs or organizations: Not on file    Relationship status: Not on file  Other Topics Concern  . Not on file  Social History Narrative  . Not on file   Allergies  Allergen Reactions  . Gabapentin Swelling   Family History  Problem Relation Age of Onset  . Depression Mother   . Depression Father   . Alcohol abuse Father      Past medical history, social, surgical and family history all reviewed in electronic medical record.  No pertanent information unless stated regarding to the chief complaint.   Review of Systems:Review of systems updated and as accurate as of 09/07/17  No headache, visual changes, nausea, vomiting, diarrhea, constipation, dizziness, abdominal pain, skin rash, fevers, chills, night sweats, weight  loss, swollen lymph nodes, body aches, joint swelling, , chest pain, shortness of breath, mood changes.  Positive muscle aches  Objective  Blood pressure 130/90, pulse 97, height 5\' 1"  (1.549 m), weight 160 lb (72.6 kg), SpO2 96 %. Systems examined below as of 09/07/17   General: No apparent distress alert and oriented x3 mood and affect normal, dressed appropriately.  HEENT: Pupils equal, extraocular movements intact  Respiratory: Patient's speak in full sentences and does not appear short of breath  Cardiovascular: No lower extremity edema, non tender, no erythema  Skin: Warm dry intact with no signs of infection or rash on extremities or on axial skeleton.  Abdomen: Soft nontender  Neuro: Cranial nerves II through XII are intact, neurovascularly intact in all extremities with 2+ DTRs and 2+ pulses.  Lymph: No  lymphadenopathy of posterior or anterior cervical chain or axillae bilaterally.  Gait normal with good balance and coordination.  MSK:  Non tender with full range of motion and good stability and symmetric strength and tone of shoulders, elbows,  hip, knee and ankles bilaterally.  Left wrist shows full range of motion with tenderness over the [facility] joint with positive grind.  4 out of 5 strength of the CMC joint.  Neck exam shows loss of lordosis and decreasing range of motion especially with extension.  Negative Spurling's today.  Crepitus noted.  Osteopathic findings C2 flexed rotated and side bent right C4 flexed rotated and side bent left C6 flexed rotated and side bent left T3 extended rotated and side bent right inhaled third rib T9 extended rotated and side bent left L2 flexed rotated and side bent right Sacrum right on right    Impression and Recommendations:     This case required medical decision making of moderate complexity.      Note: This dictation was prepared with Dragon dictation along with smaller phrase technology. Any transcriptional errors that result from this process are unintentional.

## 2017-09-07 ENCOUNTER — Encounter: Payer: Self-pay | Admitting: Family Medicine

## 2017-09-07 ENCOUNTER — Ambulatory Visit: Payer: 59 | Admitting: Family Medicine

## 2017-09-07 VITALS — BP 130/90 | HR 97 | Ht 61.0 in | Wt 160.0 lb

## 2017-09-07 DIAGNOSIS — M999 Biomechanical lesion, unspecified: Secondary | ICD-10-CM

## 2017-09-07 DIAGNOSIS — M4722 Other spondylosis with radiculopathy, cervical region: Secondary | ICD-10-CM | POA: Diagnosis not present

## 2017-09-07 DIAGNOSIS — M1812 Unilateral primary osteoarthritis of first carpometacarpal joint, left hand: Secondary | ICD-10-CM

## 2017-09-07 NOTE — Assessment & Plan Note (Signed)
Decision today to treat with OMT was based on Physical Exam  After verbal consent patient was treated with HVLA, ME, FPR techniques in cervical, thoracic, lumbar and sacral areas  Patient tolerated the procedure well with improvement in symptoms  Patient given exercises, stretches and lifestyle modifications  See medications in patient instructions if given  Patient will follow up in 4 weeks 

## 2017-09-07 NOTE — Assessment & Plan Note (Signed)
Discussed with patient again at great length.  Discussed posture and ergonomics.  Discussed which activities of doing which wants to avoid.  Continue the Effexor, continue the vitamin D supplementation.  Encourage patient to use the meloxicam on a more regular basis.  Follow-up again in 4 weeks

## 2017-09-07 NOTE — Patient Instructions (Signed)
Good to see you  Ice is your friend Keep up with the brace Read about the PRP but call us if you want to do it so we can set up  Otherwise we will do another injection in your thumb

## 2017-09-07 NOTE — Assessment & Plan Note (Signed)
Worsening symptoms again.  Discussed with limiting trying to keep patient from having this on a regular basis.  Discussed PRP.  Patient will consider.  Follow-up again 4 to 8 weeks

## 2017-09-13 ENCOUNTER — Telehealth: Payer: Self-pay | Admitting: Internal Medicine

## 2017-09-13 NOTE — Telephone Encounter (Signed)
Copied from [place] [phone]. Topic: Quick Communication - Rx Refill/Question >> Sep 13, 2017  2:51 PM Alvena, Kiernan wrote: Medication: meloxicam (MOBIC) 15 MG tablet   Has the patient contacted their pharmacy? Yes.   (Agent: If no, request that the patient contact the pharmacy for the refill.) (Agent: If yes, when and what did the pharmacy advise?)  Preferred Pharmacy (with phone number or street name): [facility] [phone] on [address]  Agent: Please be advised that RX refills may take up to 3 business days. We ask that you follow-up with your pharmacy.

## 2017-09-14 ENCOUNTER — Other Ambulatory Visit: Payer: Self-pay | Admitting: *Deleted

## 2017-09-14 MED ORDER — MELOXICAM 15 MG PO TABS: 15.0000 mg | ORAL_TABLET | Freq: Every day | ORAL | 0 refills | Status: DC

## 2017-10-02 NOTE — Progress Notes (Signed)
[name] [address], [place] [address] Phone: [phone] Subjective:    CC: Back pain and neck pain follow-up  [id number]  Tamara [name] is a 57 y.o. female coming in with complaint of back and left hand pain. She has been using a ring as a reminder to not use her hand and thumb as much. She is unable to do PRP due to financial reasons. Her back pain has improved with OMT.      Past Medical History:  Diagnosis Date  . Allergic rhinitis, mild   . Anemia   . Depression   . Hypertension   . Ovarian cyst    Past Surgical History:  Procedure Laterality Date  . LAMINECTOMY    . LUMBAR EPIDURAL INJECTION  11/03/2014   Social History   Socioeconomic History  . Marital status: Married    Spouse name: Not on file  . Number of children: 0  . Years of education: Not on file  . Highest education level: Not on file  Occupational History  . Not on file  Social Needs  . Financial resource strain: Not on file  . Food insecurity:    Worry: Not on file    Inability: Not on file  . Transportation needs:    Medical: Not on file    Non-medical: Not on file  Tobacco Use  . Smoking status: Never Smoker  . Smokeless tobacco: Never Used  Substance and Sexual Activity  . Alcohol use: Yes    Alcohol/week: 3.0 standard drinks    Types: 3 Standard drinks or equivalent per week  . Drug use: No  . Sexual activity: Yes    Partners: Male    Birth control/protection: IUD  Lifestyle  . Physical activity:    Days per week: Not on file    Minutes per session: Not on file  . Stress: Not on file  Relationships  . Social connections:    Talks on phone: Not on file    Gets together: Not on file    Attends religious service: Not on file    Active member of club or organization: Not on file    Attends meetings of clubs or organizations: Not on file    Relationship status: Not on file  Other Topics Concern  . Not on file  Social History  Narrative  . Not on file   Allergies  Allergen Reactions  . Gabapentin Swelling   Family History  Problem Relation Age of Onset  . Depression Mother   . Depression Father   . Alcohol abuse Father      Past medical history, social, surgical and family history all reviewed in electronic medical record.  No pertanent information unless stated regarding to the chief complaint.   Review of Systems:Review of systems updated and as accurate as of 10/02/17  No headache, visual changes, nausea, vomiting, diarrhea, constipation, dizziness, abdominal pain, skin rash, fevers, chills, night sweats, weight loss, swollen lymph nodes, body aches, joint swelling, muscle aches, chest pain, shortness of breath, mood changes.   Objective  There were no vitals taken for this visit. Systems examined below as of 10/02/17   General: No apparent distress alert and oriented x3 mood and affect normal, dressed appropriately.  HEENT: Pupils equal, extraocular movements intact  Respiratory: Patient's speak in full sentences and does not appear short of breath  Cardiovascular: No lower extremity edema, non tender, no erythema  Skin: Warm dry intact with no  signs of infection or rash on extremities or on axial skeleton.  Abdomen: Soft nontender  Neuro: Cranial nerves II through XII are intact, neurovascularly intact in all extremities with 2+ DTRs and 2+ pulses.  Lymph: No lymphadenopathy of posterior or anterior cervical chain or axillae bilaterally.  Gait normal with good balance and coordination.  MSK:  Non tender with full range of motion and good stability and symmetric strength and tone of shoulders, elbows, wrist, hip, knee and ankles bilaterally.   Left hand exam shows severe tenderness to palpation Neck: Inspection loss of lordosis. No palpable stepoffs. Negative Spurling's maneuver. Loss of lordosis of the neck mild decrease in sidebending bilaterally Grip strength and sensation normal in  bilateral hands Strength good C4 to T1 distribution No sensory change to C4 to T1 Negative Hoffman sign bilaterally Reflexes normal Tightness noted of the trapezius  Back Exam:  Inspection: Unremarkable  Motion: Flexion 45 deg, Extension 20 deg, Side Bending to 35 deg bilaterally,  Rotation to 30 deg bilaterally  SLR laying: Negative  XSLR laying: Negative  Palpable tenderness: Tender to palpation the paraspinal musculature FABER: Positive Faber. Sensory change: Gross sensation intact to all lumbar and sacral dermatomes.  Reflexes: 2+ at both patellar tendons, 2+ at achilles tendons, Babinski's downgoing.  Strength at foot  Plantar-flexion: 5/5 Dorsi-flexion: 5/5 Eversion: 5/5 Inversion: 5/5  Leg strength  Quad: 5/5 Hamstring: 5/5 Hip flexor: 5/5 Hip abductors: 4/5 symmetric Gait unremarkable.  Osteopathic findings  C2 flexed rotated and side bent right C4 flexed rotated and side bent left T3 extended rotated and side bent right inhaled third rib T5 extended rotated and side bent left L2 flexed rotated and side bent right Sacrum right on right  Procedure: Real-time Ultrasound Guided Injection of left CMC joint Device: GE Logiq Q7 Ultrasound guided injection is preferred based studies that show increased duration, increased effect, greater accuracy, decreased procedural pain, increased response rate, and decreased cost with ultrasound guided versus blind injection.  Verbal informed consent obtained.  Time-out conducted.  Noted no overlying erythema, induration, or other signs of local infection.  Skin prepped in a sterile fashion.  Local anesthesia: Topical Ethyl chloride.  With sterile technique and under real time ultrasound guidance: 25-gauge half inch needle used with 0.5 cc of 0.5% Marcaine and 0.5 cc of Kenalog 40 mg/mL Completed without difficulty  Pain immediately resolved suggesting accurate placement of the medication.  Advised to call if fevers/chills, erythema,  induration, drainage, or persistent bleeding.  Images permanently stored and available for review in the ultrasound unit.  Impression: Technically successful ultrasound guided injection.    Impression and Recommendations:     This case required medical decision making of moderate complexity.      Note: This dictation was prepared with Dragon dictation along with smaller phrase technology. Any transcriptional errors that result from this process are unintentional.

## 2017-10-04 ENCOUNTER — Encounter: Payer: Self-pay | Admitting: Family Medicine

## 2017-10-04 ENCOUNTER — Ambulatory Visit: Payer: 59 | Admitting: Family Medicine

## 2017-10-04 ENCOUNTER — Ambulatory Visit: Payer: Self-pay

## 2017-10-04 VITALS — BP 118/88 | HR 95 | Ht 61.0 in | Wt 162.0 lb

## 2017-10-04 DIAGNOSIS — M999 Biomechanical lesion, unspecified: Secondary | ICD-10-CM | POA: Diagnosis not present

## 2017-10-04 DIAGNOSIS — M79642 Pain in left hand: Secondary | ICD-10-CM | POA: Diagnosis not present

## 2017-10-04 DIAGNOSIS — M1812 Unilateral primary osteoarthritis of first carpometacarpal joint, left hand: Secondary | ICD-10-CM

## 2017-10-04 NOTE — Assessment & Plan Note (Signed)
Decision today to treat with OMT was based on Physical Exam  After verbal consent patient was treated with HVLA, ME, FPR techniques in cervical, thoracic, rib, lumbar and sacral areas  Patient tolerated the procedure well with improvement in symptoms  Patient given exercises, stretches and lifestyle modifications  See medications in patient instructions if given  Patient will follow up in 4-6 weeks 

## 2017-10-04 NOTE — Assessment & Plan Note (Signed)
Patient given another injection today.  Discussed icing regimen and home exercise.  Discussed which activities to do which was to avoid patient does have a very small splint and we discussed bracing at night.  Patient does not want any surgical intervention

## 2017-10-04 NOTE — Patient Instructions (Addendum)
Good to see you  Injected the thumb again  I hope it helps Continue everything else See me again in 5 weeks!

## 2017-10-06 ENCOUNTER — Encounter: Payer: 59 | Admitting: Internal Medicine

## 2017-10-13 ENCOUNTER — Other Ambulatory Visit: Payer: Self-pay | Admitting: Family Medicine

## 2017-10-13 NOTE — Telephone Encounter (Signed)
Refill done.  

## 2017-10-20 ENCOUNTER — Encounter: Payer: 59 | Admitting: Internal Medicine

## 2017-11-02 ENCOUNTER — Other Ambulatory Visit: Payer: Self-pay

## 2017-11-02 MED ORDER — LOSARTAN POTASSIUM 100 MG PO TABS: 100.0000 mg | ORAL_TABLET | Freq: Every day | ORAL | 1 refills | Status: DC

## 2017-11-03 ENCOUNTER — Encounter: Payer: 59 | Admitting: Internal Medicine

## 2017-11-08 NOTE — Progress Notes (Signed)
[name], [place] [address] Phone: [phone] Subjective:   Tamara Tamara [name], am serving as a scribe for Dr. Hulan Saas.    CC: Wrist pain  [id number]  Tamara Tamara [name] is a 57 y.o. female coming in with complaint of wrist pain. She had some relief with the injection that she had last visit. Pain with wrist extension and picking up weighted objects.  Patient also has neck and back pain.  Has responded well to manipulation.  Tamara [name] radicular symptoms at the moment.  Has not needed an epidural in quite some time.  Still takes gabapentin.      Past Medical History:  Diagnosis Date  . Allergic rhinitis, mild   . Anemia   . Depression   . Hypertension   . Ovarian cyst    Past Surgical History:  Procedure Laterality Date  . LAMINECTOMY    . LUMBAR EPIDURAL INJECTION  11/03/2014   Social History   Socioeconomic History  . Marital status: Married    Spouse name: Not on file  . Number of children: 0  . Years of education: Not on file  . Highest education level: Not on file  Occupational History  . Not on file  Social Needs  . Financial resource strain: Not on file  . Food insecurity:    Worry: Not on file    Inability: Not on file  . Transportation needs:    Medical: Not on file    Non-medical: Not on file  Tobacco Use  . Smoking status: Never Smoker  . Smokeless tobacco: Never Used  Substance and Sexual Activity  . Alcohol use: Yes    Alcohol/week: 3.0 standard drinks    Types: 3 Standard drinks or equivalent per week  . Drug use: Tamara [name]  . Sexual activity: Yes    Partners: Male    Birth control/protection: IUD  Lifestyle  . Physical activity:    Days per week: Not on file    Minutes per session: Not on file  . Stress: Not on file  Relationships  . Social connections:    Talks on phone: Not on file    Gets together: Not on file    Attends religious service: Not on file    Active member of club or  organization: Not on file    Attends meetings of clubs or organizations: Not on file    Relationship status: Not on file  Other Topics Concern  . Not on file  Social History Narrative  . Not on file   Allergies  Allergen Reactions  . Gabapentin Swelling   Family History  Problem Relation Age of Onset  . Depression Mother   . Depression Father   . Alcohol abuse Father      Current Outpatient Medications (Cardiovascular):  .  losartan (COZAAR) 100 MG tablet, Take 1 tablet (100 mg total) by mouth daily.  Current Outpatient Medications (Respiratory):  .  fluticasone (FLONASE) 50 MCG/ACT nasal spray, Place 2 sprays into both nostrils daily.  Current Outpatient Medications (Analgesics):  .  meloxicam (MOBIC) 15 MG tablet, TAKE 1 TABLET BY MOUTH DAILY   Current Outpatient Medications (Other):  Marland Kitchen  ALPRAZolam (XANAX) 0.5 MG tablet, TAKE 1 TABLET BY MOUTH THREE TIMES DAILY AS NEEDED FOR ANXIETY .  FLUoxetine (PROZAC) 20 MG tablet, TAKE 1 TABLET BY MOUTH ONCE DAILY .  hydrOXYzine (ATARAX/VISTARIL) 10 MG tablet, Take 1 tablet (10 mg total) by mouth every 8 (eight)  hours as needed. .  venlafaxine XR (EFFEXOR-XR) 75 MG 24 hr capsule, TAKE 1 CAPSULE(75 MG) BY MOUTH DAILY WITH BREAKFAST    Past medical history, social, surgical and family history all reviewed in electronic medical record.  Tamara [name] pertanent information unless stated regarding to the chief complaint.   Review of Systems:  Tamara [name] headache, visual changes, nausea, vomiting, diarrhea, constipation, dizziness, abdominal pain, skin rash, fevers, chills, night sweats, weight loss, swollen lymph nodes, body aches, joint swelling,  chest pain, shortness of breath, mood changes.  Positive muscle aches  Objective  Blood pressure 118/88, pulse (!) 101, height 5\' 1"  (1.549 m), weight 162 lb (73.5 kg), SpO2 98 %.    General: Tamara [name] apparent distress alert and oriented x3 mood and affect normal, dressed appropriately.  HEENT: Pupils equal,  extraocular movements intact  Respiratory: Patient's speak in full sentences and does not appear short of breath  Cardiovascular: Tamara [name] lower extremity edema, non tender, Tamara [name] erythema  Skin: Warm dry intact with Tamara [name] signs of infection or rash on extremities or on axial skeleton.  Abdomen: Soft nontender  Neuro: Cranial nerves II through XII are intact, neurovascularly intact in all extremities with 2+ DTRs and 2+ pulses.  Lymph: Tamara [name] lymphadenopathy of posterior or anterior cervical chain or axillae bilaterally.  Gait normal with good balance and coordination.  MSK:  Non tender with full range of motion and good stability and symmetric strength and tone of shoulders, elbows, wrist, hip, knee and ankles bilaterally.  Patient still has tenderness around the left [facility] joint but Tamara [name] swelling noted at this time. Neck exam shows loss of lordosis. Back Exam:  Inspection: Unremarkable  Motion: Flexion 40 deg, Extension 25 deg, Side Bending to 35 deg bilaterally,  Rotation to 45 deg bilaterally  SLR laying: Negative  XSLR laying: Negative  Palpable tenderness: Tender to palpation in the paraspinal musculature in the lumbar spine. FABER: negative. Sensory change: Gross sensation intact to all lumbar and sacral dermatomes.  Reflexes: 2+ at both patellar tendons, 2+ at achilles tendons, Babinski's downgoing.  Strength at foot  Plantar-flexion: 5/5 Dorsi-flexion: 5/5 Eversion: 5/5 Inversion: 5/5  Leg strength  Quad: 5/5 Hamstring: 5/5 Hip flexor: 5/5 Hip abductors: 4/5 but symmetric Gait unremarkable.  Osteopathic findings C6 flexed rotated and side bent left T3 extended rotated and side bent right inhaled third rib L2 flexed rotated and side bent right Sacrum right on right    Impression and Recommendations:     This case required medical decision making of moderate complexity. The above documentation has been reviewed and is accurate and complete Tamara Pulley, DO       Note: This dictation  was prepared with Dragon dictation along with smaller phrase technology. Any transcriptional errors that result from this process are unintentional.

## 2017-11-09 ENCOUNTER — Ambulatory Visit: Payer: 59 | Admitting: Family Medicine

## 2017-11-09 ENCOUNTER — Encounter: Payer: Self-pay | Admitting: Family Medicine

## 2017-11-09 VITALS — BP 118/88 | HR 101 | Ht 61.0 in | Wt 162.0 lb

## 2017-11-09 DIAGNOSIS — M48061 Spinal stenosis, lumbar region without neurogenic claudication: Secondary | ICD-10-CM

## 2017-11-09 DIAGNOSIS — M999 Biomechanical lesion, unspecified: Secondary | ICD-10-CM

## 2017-11-09 NOTE — Patient Instructions (Addendum)
Good to see you  See me again in 6 weeks Enjoy the gummies

## 2017-11-09 NOTE — Assessment & Plan Note (Addendum)
Patient does have more of a stenosis and we will continue to monitor.  We discussed icing regimen.  Discussed with patient about posture and ergonomics.  Patient is to work on core strength.

## 2017-11-09 NOTE — Assessment & Plan Note (Signed)
Decision today to treat with OMT was based on Physical Exam  After verbal consent patient was treated with HVLA, ME, FPR techniques in cervical, thoracic, rib, lumbar and sacral areas  Patient tolerated the procedure well with improvement in symptoms  Patient given exercises, stretches and lifestyle modifications  See medications in patient instructions if given  Patient will follow up in 6 weeks 

## 2017-11-10 ENCOUNTER — Ambulatory Visit (INDEPENDENT_AMBULATORY_CARE_PROVIDER_SITE_OTHER): Payer: 59 | Admitting: Internal Medicine

## 2017-11-10 ENCOUNTER — Other Ambulatory Visit (INDEPENDENT_AMBULATORY_CARE_PROVIDER_SITE_OTHER): Payer: 59

## 2017-11-10 ENCOUNTER — Encounter: Payer: Self-pay | Admitting: Internal Medicine

## 2017-11-10 VITALS — BP 128/66 | HR 69 | Temp 97.7°F | Ht 61.0 in | Wt 162.0 lb

## 2017-11-10 DIAGNOSIS — Z23 Encounter for immunization: Secondary | ICD-10-CM | POA: Diagnosis not present

## 2017-11-10 DIAGNOSIS — Z0000 Encounter for general adult medical examination without abnormal findings: Secondary | ICD-10-CM

## 2017-11-10 DIAGNOSIS — I10 Essential (primary) hypertension: Secondary | ICD-10-CM | POA: Diagnosis not present

## 2017-11-10 DIAGNOSIS — D721 Eosinophilia, unspecified: Secondary | ICD-10-CM

## 2017-11-10 LAB — URINALYSIS, ROUTINE W REFLEX MICROSCOPIC
Bilirubin Urine: NEGATIVE
HGB URINE DIPSTICK: NEGATIVE
Ketones, ur: NEGATIVE
LEUKOCYTES UA: NEGATIVE
NITRITE: NEGATIVE
RBC / HPF: NONE SEEN (ref 0–?)
Specific Gravity, Urine: 1.01 (ref 1.000–1.030)
Total Protein, Urine: NEGATIVE
Urine Glucose: NEGATIVE
Urobilinogen, UA: 0.2 (ref 0.0–1.0)
pH: 6 (ref 5.0–8.0)

## 2017-11-10 LAB — BASIC METABOLIC PANEL
BUN: 8 mg/dL (ref 6–23)
CO2: 29 mEq/L (ref 19–32)
CREATININE: 0.74 mg/dL (ref 0.40–1.20)
Calcium: 9.1 mg/dL (ref 8.4–10.5)
Chloride: 105 mEq/L (ref 96–112)
GFR: 86.64 mL/min (ref >60.00)
Glucose, Bld: 85 mg/dL (ref 70–99)
POTASSIUM: 4.3 meq/L (ref 3.5–5.1)
Sodium: 141 mEq/L (ref 135–145)

## 2017-11-10 LAB — HEPATIC FUNCTION PANEL
ALT: 20 U/L (ref 0–35)
AST: 24 U/L (ref 0–37)
Albumin: 4.4 g/dL (ref 3.5–5.2)
Alkaline Phosphatase: 73 U/L (ref 39–117)
BILIRUBIN TOTAL: 0.4 mg/dL (ref 0.2–1.2)
Bilirubin, Direct: 0.1 mg/dL (ref 0.0–0.3)
Total Protein: 7 g/dL (ref 6.0–8.3)

## 2017-11-10 LAB — LIPID PANEL
CHOLESTEROL: 182 mg/dL (ref 0–200)
HDL: 86.7 mg/dL (ref >39.00)
LDL CALC: 79 mg/dL (ref 0–99)
NonHDL: 95.12
Total CHOL/HDL Ratio: 2
Triglycerides: 83 mg/dL (ref 0.0–149.0)
VLDL: 16.6 mg/dL (ref 0.0–40.0)

## 2017-11-10 LAB — CBC WITH DIFFERENTIAL/PLATELET
Basophils Absolute: 0 10*3/uL (ref 0.0–0.1)
Basophils Relative: 0.9 % (ref 0.0–3.0)
EOS ABS: 0.6 10*3/uL (ref 0.0–0.7)
Eosinophils Relative: 12.7 % — ABNORMAL HIGH (ref 0.0–5.0)
HEMATOCRIT: 40.9 % (ref 36.0–46.0)
Hemoglobin: 14.1 g/dL (ref 12.0–15.0)
LYMPHS PCT: 31.5 % (ref 12.0–46.0)
Lymphs Abs: 1.5 10*3/uL (ref 0.7–4.0)
MCHC: 34.5 g/dL (ref 30.0–36.0)
MCV: 96.1 fl (ref 78.0–100.0)
MONOS PCT: 9.5 % (ref 3.0–12.0)
Monocytes Absolute: 0.5 10*3/uL (ref 0.1–1.0)
NEUTROS ABS: 2.2 10*3/uL (ref 1.4–7.7)
Neutrophils Relative %: 45.4 % (ref 43.0–77.0)
PLATELETS: 285 10*3/uL (ref 150.0–400.0)
RBC: 4.26 Mil/uL (ref 3.87–5.11)
RDW: 12.3 % (ref 11.5–15.5)
WBC: 4.7 10*3/uL (ref 4.0–10.5)

## 2017-11-10 LAB — TSH: TSH: 2.24 u[IU]/mL (ref 0.35–4.50)

## 2017-11-10 NOTE — Progress Notes (Signed)
Subjective:    Patient ID: Tamara [name], female    DOB: Nov 26, 1960, 57 y.o.   MRN: [id number]  HPI.Here for wellness and f/u;  Overall doing ok;  Pt denies Chest pain, worsening SOB, DOE, wheezing, orthopnea, PND, worsening LE edema, palpitations, dizziness or syncope.  Pt denies neurological change such as new headache, facial or extremity weakness.  Pt denies polydipsia, polyuria, or low sugar symptoms. Pt states overall good compliance with treatment and medications, good tolerability, and has been trying to follow appropriate diet.  Pt denies worsening depressive symptoms, suicidal ideation or panic. No fever, night sweats, wt loss, loss of appetite, or other constitutional symptoms.  Pt states good ability with ADL's, has low fall risk, home safety reviewed and adequate, no other significant changes in hearing or vision, and only occasionally active with exercise, in fact less activity over the past year overal with several lbs incresae over last yr  No new complaints Wt Readings from Last 3 Encounters:  11/10/17 162 lb (73.5 kg)  11/09/17 162 lb (73.5 kg)  10/04/17 162 lb (73.5 kg)  Pt continues to have recurring LBP without change in severity, bowel or bladder change, fever, wt loss,  worsening LE pain/numbness/weakness, gait change or falls, sees Dr Tamala Julian occasionally with neck and spine and left wrist, last cortisone seemed to help about 4 wks ago.   Due for GYN soon.   Past Medical History:  Diagnosis Date  . Allergic rhinitis, mild   . Anemia   . Depression   . Hypertension   . Ovarian cyst    Past Surgical History:  Procedure Laterality Date  . LAMINECTOMY    . LUMBAR EPIDURAL INJECTION  11/03/2014    reports that she has never smoked. She has never used smokeless tobacco. She reports that she drinks about 3.0 standard drinks of alcohol per week. She reports that she does not use drugs. family history includes Alcohol abuse in her father; Depression in her father and  mother. Allergies  Allergen Reactions  . Gabapentin Swelling   Current Outpatient Medications on File Prior to Visit  Medication Sig Dispense Refill  . ALPRAZolam (XANAX) 0.5 MG tablet TAKE 1 TABLET BY MOUTH THREE TIMES DAILY AS NEEDED FOR ANXIETY 90 tablet 2  . fluticasone (FLONASE) 50 MCG/ACT nasal spray Place 2 sprays into both nostrils daily. 16 g 6  . hydrOXYzine (ATARAX/VISTARIL) 10 MG tablet Take 1 tablet (10 mg total) by mouth every 8 (eight) hours as needed. 90 tablet 3  . losartan (COZAAR) 100 MG tablet Take 1 tablet (100 mg total) by mouth daily. 90 tablet 1  . meloxicam (MOBIC) 15 MG tablet TAKE 1 TABLET BY MOUTH DAILY 90 tablet 0  . venlafaxine XR (EFFEXOR-XR) 75 MG 24 hr capsule TAKE 1 CAPSULE(75 MG) BY MOUTH DAILY WITH BREAKFAST 90 capsule 1   No current facility-administered medications on file prior to visit.     Review of Systems Constitutional: Negative for other unusual diaphoresis, sweats, appetite or weight changes HENT: Negative for other worsening hearing loss, ear pain, facial swelling, mouth sores or neck stiffness.   Eyes: Negative for other worsening pain, redness or other visual disturbance.  Respiratory: Negative for other stridor or swelling Cardiovascular: Negative for other palpitations or other chest pain  Gastrointestinal: Negative for worsening diarrhea or loose stools, blood in stool, distention or other pain Genitourinary: Negative for hematuria, flank pain or other change in urine volume.  Musculoskeletal: Negative for myalgias or other joint swelling.  Skin: Negative for other color change, or other wound or worsening drainage.  Neurological: Negative for other syncope or numbness. Hematological: Negative for other adenopathy or swelling Psychiatric/Behavioral: Negative for hallucinations, other worsening agitation, SI, self-injury, or new decreased concentration All other system neg prer pt   Objective:   Physical Exam BP 128/66   Pulse 69    Temp 97.7 F (36.5 C) (Oral)   Ht 5\' 1"  (1.549 m)   Wt 162 lb (73.5 kg)   SpO2 97%   BMI 30.61 kg/m  VS noted,  Constitutional: Pt is oriented to person, place, and time. Appears well-developed and well-nourished, in no significant distress and comfortable Head: Normocephalic and atraumatic  Eyes: Conjunctivae and EOM are normal. Pupils are equal, round, and reactive to light Right Ear: External ear normal without discharge Left Ear: External ear normal without discharge Nose: Nose without discharge or deformity Mouth/Throat: Oropharynx is without other ulcerations and moist  Neck: Normal range of motion. Neck supple. No JVD present. No tracheal deviation present or significant neck LA or mass Cardiovascular: Normal rate, regular rhythm, normal heart sounds and intact distal pulses.  Pulmonary/Chest: WOB normal and breath sounds without rales or wheezing  Abdominal: Soft. Bowel sounds are normal. NT. No HSM  Musculoskeletal: Normal range of motion. Exhibits no edema Lymphadenopathy: Has no other cervical adenopathy.  Neurological: Pt is alert and oriented to person, place, and time. Pt has normal reflexes. No cranial nerve deficit. Motor grossly intact, Gait intact Skin: Skin is warm and dry. No rash noted or new ulcerations Psychiatric:  Has normal mood and affect. Behavior is normal without agitation All other exam findings  Lab Results  Component Value Date   WBC 4.9 09/30/2016   HGB 14.4 09/30/2016   HCT 42.4 09/30/2016   PLT 293.0 09/30/2016   GLUCOSE 89 09/30/2016   CHOL 169 09/30/2016   TRIG 72.0 09/30/2016   HDL 87.00 09/30/2016   LDLCALC 68 09/30/2016   ALT 18 09/30/2016   AST 19 09/30/2016   NA 141 09/30/2016   K 4.6 09/30/2016   CL 105 09/30/2016   CREATININE 0.73 09/30/2016   BUN 12 09/30/2016   CO2 28 09/30/2016   TSH 2.08 09/30/2016         Assessment & Plan:

## 2017-11-10 NOTE — Assessment & Plan Note (Signed)
For f/u, consider hematology referral

## 2017-11-10 NOTE — Assessment & Plan Note (Signed)
stable overall by history and exam, recent data reviewed with pt, and pt to continue medical treatment as before,  to f/u any worsening symptoms or concerns  

## 2017-11-10 NOTE — Patient Instructions (Addendum)

## 2017-11-10 NOTE — Assessment & Plan Note (Signed)

## 2017-11-15 ENCOUNTER — Encounter: Payer: Self-pay | Admitting: Family Medicine

## 2017-12-19 NOTE — Progress Notes (Signed)
Tamara Tam[name],  [address] Phone: [phone] Subjective:   Tamara Tamara [name], am serving as a scribe for Tamara Tamara [name].   CC: Neck and back pain follow-up  [id number]  Tamara Tamara [name] is a 57 y.o. female coming in with complaint of back pain. She has been doing well since last visit. Tamara [name] changes in her back. She feels tight as she has not been working out.  Known degenerative disc disease.  Discussed icing regimen which she has been doing intermittently.  Nothing severe.  Still having some aching.  Not doing as much lifting recently which she thinks is better.     Past Medical History:  Diagnosis Date  . Allergic rhinitis, mild   . Anemia   . Depression   . Hypertension   . Ovarian cyst    Past Surgical History:  Procedure Laterality Date  . LAMINECTOMY    . LUMBAR EPIDURAL INJECTION  11/03/2014   Social History   Socioeconomic History  . Marital status: Married    Spouse name: Not on file  . Number of children: 0  . Years of education: Not on file  . Highest education level: Not on file  Occupational History  . Not on file  Social Needs  . Financial resource strain: Not on file  . Food insecurity:    Worry: Not on file    Inability: Not on file  . Transportation needs:    Medical: Not on file    Non-medical: Not on file  Tobacco Use  . Smoking status: Never Smoker  . Smokeless tobacco: Never Used  Substance and Sexual Activity  . Alcohol use: Yes    Alcohol/week: 3.0 standard drinks    Types: 3 Standard drinks or equivalent per week  . Drug use: Tamara [name]  . Sexual activity: Yes    Partners: Male    Birth control/protection: IUD  Lifestyle  . Physical activity:    Days per week: Not on file    Minutes per session: Not on file  . Stress: Not on file  Relationships  . Social connections:    Talks on phone: Not on file    Gets together: Not on file    Attends religious service: Not on file   Active member of club or organization: Not on file    Attends meetings of clubs or organizations: Not on file    Relationship status: Not on file  Other Topics Concern  . Not on file  Social History Narrative  . Not on file   Allergies  Allergen Reactions  . Gabapentin Swelling   Family History  Problem Relation Age of Onset  . Depression Mother   . Depression Father   . Alcohol abuse Father      Current Outpatient Medications (Cardiovascular):  .  losartan (COZAAR) 100 MG tablet, Take 1 tablet (100 mg total) by mouth daily.  Current Outpatient Medications (Respiratory):  .  fluticasone (FLONASE) 50 MCG/ACT nasal spray, Place 2 sprays into both nostrils daily.  Current Outpatient Medications (Analgesics):  .  meloxicam (MOBIC) 15 MG tablet, TAKE 1 TABLET BY MOUTH DAILY   Current Outpatient Medications (Other):  Marland Kitchen  ALPRAZolam (XANAX) 0.5 MG tablet, TAKE 1 TABLET BY MOUTH THREE TIMES DAILY AS NEEDED FOR ANXIETY .  hydrOXYzine (ATARAX/VISTARIL) 10 MG tablet, Take 1 tablet (10 mg total) by mouth every 8 (eight) hours as needed. .  venlafaxine XR (EFFEXOR-XR) 75 MG 24 hr capsule,  TAKE 1 CAPSULE(75 MG) BY MOUTH DAILY WITH BREAKFAST    Past medical history, social, surgical and family history all reviewed in electronic medical record.  Tamara Tamara [name] unless stated regarding to the chief complaint.   Review of Systems:  Tamara Tamara [name], visual changes, nausea, vomiting, diarrhea, constipation, dizziness, abdominal pain, skin rash, fevers, chills, night sweats, weight loss, swollen lymph nodes, body aches, joint swelling,  chest pain, shortness of breath, mood changes.  Positive muscle aches  Objective  Blood pressure 110/82, pulse (!) 101, height 5\' 1"  (1.549 m), weight 160 lb (72.6 kg), SpO2 98 %.   General: Tamara Tamara [name] alert and oriented x3 mood and affect normal, dressed appropriately.  HEENT: Pupils equal, extraocular movements intact  Respiratory: Patient's  speak in full sentences and does not appear short of breath  Cardiovascular: Tamara [name] lower extremity edema, non tender, Tamara [name] erythema  Skin: Warm dry intact with Tamara [name] signs of infection or rash on extremities or on axial skeleton.  Abdomen: Soft nontender  Neuro: Cranial nerves II through XII are intact, neurovascularly intact in all extremities with 2+ DTRs and 2+ pulses.  Lymph: Tamara [name] lymphadenopathy of posterior or anterior cervical chain or axillae bilaterally.  Gait normal with good balance and coordination.  MSK:  Non tender with full range of motion and good stability and symmetric strength and tone of shoulders, elbows, wrist, hip, knee and ankles bilaterally.  Arthritic changes of the [facility] joint noted Neck: Inspection mild loss of lordosis. Tamara [name] palpable stepoffs. Negative Spurling's maneuver. Patient does have mild limited range of motion in all planes Grip strength and sensation normal in bilateral hands Strength good C4 to T1 distribution Tamara [name] sensory change to C4 to T1 Negative Hoffman sign bilaterally Reflexes normal  Neck exam has some mild loss of lordosis.  Mild limited range of motion with sidebending and rotation as well.  Tightness with Corky Sox test but negative straight leg test.  Neurovascularly intact with full strength of the lower extremities with deep tendon reflexes 2+  Osteopathic findings C2 flexed rotated and side bent right T3 extended rotated and side bent right inhaled third rib T6 extended rotated and side bent left L2 flexed rotated and side bent right Sacrum right on right     Impression and Recommendations:     This case required medical decision making of moderate complexity. The above documentation has been reviewed and is accurate and complete Tamara Pulley, DO       Note: This dictation was prepared with Dragon dictation along with smaller phrase technology. Any transcriptional errors that result from this process are unintentional.

## 2017-12-21 ENCOUNTER — Ambulatory Visit: Payer: 59 | Admitting: Family Medicine

## 2017-12-21 ENCOUNTER — Encounter: Payer: Self-pay | Admitting: Family Medicine

## 2017-12-21 VITALS — BP 110/82 | HR 101 | Ht 61.0 in | Wt 160.0 lb

## 2017-12-21 DIAGNOSIS — M999 Biomechanical lesion, unspecified: Secondary | ICD-10-CM | POA: Diagnosis not present

## 2017-12-21 DIAGNOSIS — M48061 Spinal stenosis, lumbar region without neurogenic claudication: Secondary | ICD-10-CM | POA: Diagnosis not present

## 2017-12-21 NOTE — Assessment & Plan Note (Signed)
Decision today to treat with OMT was based on Physical Exam  After verbal consent patient was treated with HVLA, ME, FPR techniques in cervical, thoracic, rib lumbar and sacral areas  Patient tolerated the procedure well with improvement in symptoms  Patient given exercises, stretches and lifestyle modifications  See medications in patient instructions if given  Patient will follow up in 6-12 weeks

## 2017-12-21 NOTE — Patient Instructions (Signed)
Good to see you  Ice is your friend Stay active See me again in 6 weeks

## 2017-12-21 NOTE — Assessment & Plan Note (Signed)
Discussed HEP Discussed posture.  Discussed which activities to do 'RTC in 6-12 weeks

## 2018-02-01 ENCOUNTER — Ambulatory Visit: Payer: 59 | Admitting: Family Medicine

## 2018-02-01 ENCOUNTER — Encounter: Payer: Self-pay | Admitting: Family Medicine

## 2018-02-01 VITALS — BP 130/92 | HR 102 | Ht 61.0 in | Wt 160.0 lb

## 2018-02-01 DIAGNOSIS — M48061 Spinal stenosis, lumbar region without neurogenic claudication: Secondary | ICD-10-CM | POA: Diagnosis not present

## 2018-02-01 DIAGNOSIS — M999 Biomechanical lesion, unspecified: Secondary | ICD-10-CM

## 2018-02-01 NOTE — Assessment & Plan Note (Signed)
Decision today to treat with OMT was based on Physical Exam  After verbal consent patient was treated with HVLA, ME, FPR techniques in cervical, thoracic, lumbar and sacral areas  Patient tolerated the procedure well with improvement in symptoms  Patient given exercises, stretches and lifestyle modifications  See medications in patient instructions if given  Patient will follow up in 4-8 weeks 

## 2018-02-01 NOTE — Patient Instructions (Signed)
Happy holidays!  See me again in 6 weeks

## 2018-02-01 NOTE — Progress Notes (Signed)
[name] [address] [place], [place] [address] Phone: [phone] Subjective:    I [name] am serving as a Education administrator for Dr. Hulan Saas.   CC: Back and neck pain follow-up  [id number]  Tamara [name] is a 57 y.o. female coming in with complaint of back pain. States the back is doing well.  Patient said some dull, throbbing aching pain overall.  Describes pain is 7 stable.  Patient has not had any significant worsening pain.  Starting pain on a fairly regular basis.     Past Medical History:  Diagnosis Date  . Allergic rhinitis, mild   . Anemia   . Depression   . Hypertension   . Ovarian cyst    Past Surgical History:  Procedure Laterality Date  . LAMINECTOMY    . LUMBAR EPIDURAL INJECTION  11/03/2014   Social History   Socioeconomic History  . Marital status: Married    Spouse name: Not on file  . Number of children: 0  . Years of education: Not on file  . Highest education level: Not on file  Occupational History  . Not on file  Social Needs  . Financial resource strain: Not on file  . Food insecurity:    Worry: Not on file    Inability: Not on file  . Transportation needs:    Medical: Not on file    Non-medical: Not on file  Tobacco Use  . Smoking status: Never Smoker  . Smokeless tobacco: Never Used  Substance and Sexual Activity  . Alcohol use: Yes    Alcohol/week: 3.0 standard drinks    Types: 3 Standard drinks or equivalent per week  . Drug use: No  . Sexual activity: Yes    Partners: Male    Birth control/protection: I.U.D.  Lifestyle  . Physical activity:    Days per week: Not on file    Minutes per session: Not on file  . Stress: Not on file  Relationships  . Social connections:    Talks on phone: Not on file    Gets together: Not on file    Attends religious service: Not on file    Active member of club or organization: Not on file    Attends meetings of clubs or organizations: Not on  file    Relationship status: Not on file  Other Topics Concern  . Not on file  Social History Narrative  . Not on file   Allergies  Allergen Reactions  . Gabapentin Swelling   Family History  Problem Relation Age of Onset  . Depression Mother   . Depression Father   . Alcohol abuse Father      Current Outpatient Medications (Cardiovascular):  .  losartan (COZAAR) 100 MG tablet, Take 1 tablet (100 mg total) by mouth daily.  Current Outpatient Medications (Respiratory):  .  fluticasone (FLONASE) 50 MCG/ACT nasal spray, Place 2 sprays into both nostrils daily.  Current Outpatient Medications (Analgesics):  .  meloxicam (MOBIC) 15 MG tablet, TAKE 1 TABLET BY MOUTH DAILY   Current Outpatient Medications (Other):  Marland Kitchen  ALPRAZolam (XANAX) 0.5 MG tablet, TAKE 1 TABLET BY MOUTH THREE TIMES DAILY AS NEEDED FOR ANXIETY .  hydrOXYzine (ATARAX/VISTARIL) 10 MG tablet, Take 1 tablet (10 mg total) by mouth every 8 (eight) hours as needed. .  venlafaxine XR (EFFEXOR-XR) 75 MG 24 hr capsule, TAKE 1 CAPSULE(75 MG) BY MOUTH DAILY WITH BREAKFAST    Past medical history, social, surgical  and family history all reviewed in electronic medical record.  No pertanent information unless stated regarding to the chief complaint.   Review of Systems:  No headache, visual changes, nausea, vomiting, diarrhea, constipation, dizziness, abdominal pain, skin rash, fevers, chills, night sweats, weight loss, swollen lymph nodes, body aches, joint swelling, muscle aches, chest pain, shortness of breath, mood changes.   Objective  Blood pressure (!) 130/92, pulse (!) 102, height 5\' 1"  (1.549 m), weight 160 lb (72.6 kg), SpO2 98 %.    General: No apparent distress alert and oriented x3 mood and affect normal, dressed appropriately.  HEENT: Pupils equal, extraocular movements intact  Respiratory: Patient's speak in full sentences and does not appear short of breath  Cardiovascular: No lower extremity edema, non  tender, no erythema  Skin: Warm dry intact with no signs of infection or rash on extremities or on axial skeleton.  Abdomen: Soft nontender  Neuro: Cranial nerves II through XII are intact, neurovascularly intact in all extremities with 2+ DTRs and 2+ pulses.  Lymph: No lymphadenopathy of posterior or anterior cervical chain or axillae bilaterally.  Gait normal with good balance and coordination.  MSK:  Non tender with full range of motion and good stability and symmetric strength and tone of shoulders, elbows, wrist, hip, knee and ankles bilaterally.  Neck exam has also lordosis.  Lacks 5 degrees of sidebending and rotation bilaterally.  Negative Spurling's.  Mild crepitus.  Tightness of the trapezius left greater than right  Lower back some mild increase in tightness.  Positive Corky Sox.  Negative straight leg test but tightness of the hamstrings.  Lacks last 10 degrees of extension lacks 5 degrees of flexion.  Neurovascular intact distally with 5 out of 5 strength  Osteopathic findings C2 flexed rotated and side bent right T7 extended rotated and side bent left L2 flexed rotated and side bent right Sacrum right on right     Impression and Recommendations:     This case required medical decision making of moderate complexity. The above documentation has been reviewed and is accurate and complete [name], DO       Note: This dictation was prepared with Dragon dictation along with smaller phrase technology. Any transcriptional errors that result from this process are unintentional.

## 2018-02-01 NOTE — Assessment & Plan Note (Signed)
Patient is doing decent at the moment.  Has responded to injections but continued to have insurance is unable to do it on a regular basis.  Discussed icing regimen.  Does not want any surgical intervention.  Continue with home exercises, discussed ergonomic changes.  Follow-up with me again in 4 to 8 weeks

## 2018-02-12 ENCOUNTER — Other Ambulatory Visit: Payer: Self-pay | Admitting: Internal Medicine

## 2018-02-12 NOTE — Telephone Encounter (Signed)
   LOV:11/10/17 NextOV:11/12/18 Last Filled/Quantity:12/04/17 90#

## 2018-03-14 NOTE — Progress Notes (Deleted)
[name] [place], [place] [address] Phone: [phone] Subjective:    I'm seeing this patient by the request  of:    CC:   [id number]  Tamara [name] is a 58 y.o. female coming in with complaint of ***  Onset-  Location Duration-  Character- Aggravating factors- Reliving factors-  Therapies tried-  Severity-     Past Medical History:  Diagnosis Date  . Allergic rhinitis, mild   . Anemia   . Depression   . Hypertension   . Ovarian cyst    Past Surgical History:  Procedure Laterality Date  . LAMINECTOMY    . LUMBAR EPIDURAL INJECTION  11/03/2014   Social History   Socioeconomic History  . Marital status: Married    Spouse name: Not on file  . Number of children: 0  . Years of education: Not on file  . Highest education level: Not on file  Occupational History  . Not on file  Social Needs  . Financial resource strain: Not on file  . Food insecurity:    Worry: Not on file    Inability: Not on file  . Transportation needs:    Medical: Not on file    Non-medical: Not on file  Tobacco Use  . Smoking status: Never Smoker  . Smokeless tobacco: Never Used  Substance and Sexual Activity  . Alcohol use: Yes    Alcohol/week: 3.0 standard drinks    Types: 3 Standard drinks or equivalent per week  . Drug use: No  . Sexual activity: Yes    Partners: Male    Birth control/protection: I.U.D.  Lifestyle  . Physical activity:    Days per week: Not on file    Minutes per session: Not on file  . Stress: Not on file  Relationships  . Social connections:    Talks on phone: Not on file    Gets together: Not on file    Attends religious service: Not on file    Active member of club or organization: Not on file    Attends meetings of clubs or organizations: Not on file    Relationship status: Not on file  Other Topics Concern  . Not on file  Social History Narrative  . Not on file   Allergies  Allergen  Reactions  . Gabapentin Swelling   Family History  Problem Relation Age of Onset  . Depression Mother   . Depression Father   . Alcohol abuse Father      Current Outpatient Medications (Cardiovascular):  .  losartan (COZAAR) 100 MG tablet, Take 1 tablet (100 mg total) by mouth daily.  Current Outpatient Medications (Respiratory):  .  fluticasone (FLONASE) 50 MCG/ACT nasal spray, Place 2 sprays into both nostrils daily.  Current Outpatient Medications (Analgesics):  .  meloxicam (MOBIC) 15 MG tablet, TAKE 1 TABLET BY MOUTH DAILY   Current Outpatient Medications (Other):  Marland Kitchen  ALPRAZolam (XANAX) 0.5 MG tablet, TAKE 1 TABLET BY MOUTH THREE TIMES DAILY AS NEEDED FOR ANXIETY .  hydrOXYzine (ATARAX/VISTARIL) 10 MG tablet, Take 1 tablet (10 mg total) by mouth every 8 (eight) hours as needed. .  venlafaxine XR (EFFEXOR-XR) 75 MG 24 hr capsule, TAKE 1 CAPSULE(75 MG) BY MOUTH DAILY WITH BREAKFAST    Past medical history, social, surgical and family history all reviewed in electronic medical record.  No pertanent information unless stated regarding to the chief complaint.   Review of Systems:  No headache, visual  changes, nausea, vomiting, diarrhea, constipation, dizziness, abdominal pain, skin rash, fevers, chills, night sweats, weight loss, swollen lymph nodes, body aches, joint swelling, muscle aches, chest pain, shortness of breath, mood changes.   Objective  There were no vitals taken for this visit. Systems examined below as of    General: No apparent distress alert and oriented x3 mood and affect normal, dressed appropriately.  HEENT: Pupils equal, extraocular movements intact  Respiratory: Patient's speak in full sentences and does not appear short of breath  Cardiovascular: No lower extremity edema, non tender, no erythema  Skin: Warm dry intact with no signs of infection or rash on extremities or on axial skeleton.  Abdomen: Soft nontender  Neuro: Cranial nerves II through XII  are intact, neurovascularly intact in all extremities with 2+ DTRs and 2+ pulses.  Lymph: No lymphadenopathy of posterior or anterior cervical chain or axillae bilaterally.  Gait normal with good balance and coordination.  MSK:  Non tender with full range of motion and good stability and symmetric strength and tone of shoulders, elbows, wrist, hip, knee and ankles bilaterally.     Impression and Recommendations:     This case required medical decision making of moderate complexity. The above documentation has been reviewed and is accurate and complete [name], DO       Note: This dictation was prepared with Dragon dictation along with smaller phrase technology. Any transcriptional errors that result from this process are unintentional.

## 2018-03-15 ENCOUNTER — Ambulatory Visit: Payer: 59 | Admitting: Family Medicine

## 2018-03-15 ENCOUNTER — Encounter: Payer: Self-pay | Admitting: Family Medicine

## 2018-03-28 ENCOUNTER — Ambulatory Visit: Payer: Self-pay

## 2018-03-28 ENCOUNTER — Ambulatory Visit: Payer: 59 | Admitting: Family Medicine

## 2018-03-28 VITALS — BP 120/92 | Ht 61.0 in | Wt 161.0 lb

## 2018-03-28 DIAGNOSIS — M79645 Pain in left finger(s): Secondary | ICD-10-CM | POA: Diagnosis not present

## 2018-03-28 DIAGNOSIS — G8929 Other chronic pain: Secondary | ICD-10-CM

## 2018-03-28 DIAGNOSIS — M48061 Spinal stenosis, lumbar region without neurogenic claudication: Secondary | ICD-10-CM

## 2018-03-28 DIAGNOSIS — M1812 Unilateral primary osteoarthritis of first carpometacarpal joint, left hand: Secondary | ICD-10-CM | POA: Diagnosis not present

## 2018-03-28 DIAGNOSIS — M999 Biomechanical lesion, unspecified: Secondary | ICD-10-CM | POA: Diagnosis not present

## 2018-03-28 NOTE — Patient Instructions (Signed)
Injected the thumb and hope it helps Ic eis your friend Remember the brace See me again in 6-8 weeks

## 2018-03-28 NOTE — Progress Notes (Signed)
[name] [place], Tam[place] Tam[name] [address] Phone: [phone] Subjective:   Tamara Tamara [name], am serving as a scribe for Dr. Hulan Saas.    CC: Back pain follow-up, left thumb pain  [id number]  Tamara Tamara [name] is a 58 y.o. female coming in with complaint of back pain. Patient states that she has not had any back pain since last visit.  Has responded fairly well to manipulation.  Known to have degenerative disc disease.  This is in the cervical and lumbar regions.  Is complaining of left CMC joint pain. Having constant, sharp pain. Has been using Meloxicam. Is not using her brace but does have one. Pain increase with use of her hand. Last injection August 2019.  Worsening pain affecting daily activities.  Noticing grip strength seems to be worsening.      Past Medical History:  Diagnosis Date  . Allergic rhinitis, mild   . Anemia   . Depression   . Hypertension   . Ovarian cyst    Past Surgical History:  Procedure Laterality Date  . LAMINECTOMY    . LUMBAR EPIDURAL INJECTION  11/03/2014   Social History   Socioeconomic History  . Marital status: Married    Spouse name: Not on file  . Number of children: 0  . Years of education: Not on file  . Highest education level: Not on file  Occupational History  . Not on file  Social Needs  . Financial resource strain: Not on file  . Food insecurity:    Worry: Not on file    Inability: Not on file  . Transportation needs:    Medical: Not on file    Non-medical: Not on file  Tobacco Use  . Smoking status: Never Smoker  . Smokeless tobacco: Never Used  Substance and Sexual Activity  . Alcohol use: Yes    Alcohol/week: 3.0 standard drinks    Types: 3 Standard drinks or equivalent per week  . Drug use: Tamara [name]  . Sexual activity: Yes    Partners: Male    Birth control/protection: I.U.D.  Lifestyle  . Physical activity:    Days per week: Not on file    Minutes per session: Not  on file  . Stress: Not on file  Relationships  . Social connections:    Talks on phone: Not on file    Gets together: Not on file    Attends religious service: Not on file    Active member of club or organization: Not on file    Attends meetings of clubs or organizations: Not on file    Relationship status: Not on file  Other Topics Concern  . Not on file  Social History Narrative  . Not on file   Allergies  Allergen Reactions  . Gabapentin Swelling   Family History  Problem Relation Age of Onset  . Depression Mother   . Depression Father   . Alcohol abuse Father      Current Outpatient Medications (Cardiovascular):  .  losartan (COZAAR) 100 MG tablet, Take 1 tablet (100 mg total) by mouth daily.  Current Outpatient Medications (Respiratory):  .  fluticasone (FLONASE) 50 MCG/ACT nasal spray, Place 2 sprays into both nostrils daily.  Current Outpatient Medications (Analgesics):  .  meloxicam (MOBIC) 15 MG tablet, TAKE 1 TABLET BY MOUTH DAILY   Current Outpatient Medications (Other):  Marland Kitchen  ALPRAZolam (XANAX) 0.5 MG tablet, TAKE 1 TABLET BY MOUTH THREE TIMES DAILY AS NEEDED  FOR ANXIETY .  hydrOXYzine (ATARAX/VISTARIL) 10 MG tablet, Take 1 tablet (10 mg total) by mouth every 8 (eight) hours as needed. .  venlafaxine XR (EFFEXOR-XR) 75 MG 24 hr capsule, TAKE 1 CAPSULE(75 MG) BY MOUTH DAILY WITH BREAKFAST    Past medical history, social, surgical and family history all reviewed in electronic medical record.  Tamara [name] pertanent information unless stated regarding to the chief complaint.   Review of Systems:  Tamara [name] headache, visual changes, nausea, vomiting, diarrhea, constipation, dizziness, abdominal pain, skin rash, fevers, chills, night sweats, weight loss, swollen lymph nodes, body aches, joint swelling, muscle aches, chest pain, shortness of breath, mood changes.   Objective  Blood pressure (!) 120/92, height 5\' 1"  (1.549 m), weight 161 lb (73 kg).   General: Tamara [name] apparent  distress alert and oriented x3 mood and affect normal, dressed appropriately.  HEENT: Pupils equal, extraocular movements intact  Respiratory: Patient's speak in full sentences and does not appear short of breath  Cardiovascular: Tamara [name] lower extremity edema, non tender, Tamara [name] erythema  Skin: Warm dry intact with Tamara [name] signs of infection or rash on extremities or on axial skeleton.  Abdomen: Soft nontender  Neuro: Cranial nerves II through XII are intact, neurovascularly intact in all extremities with 2+ DTRs and 2+ pulses.  Lymph: Tamara [name] lymphadenopathy of posterior or anterior cervical chain or axillae bilaterally.  Gait normal with good balance and coordination.  MSK:  Non tender with full range of motion and good stability and symmetric strength and tone of shoulders, elbows,  hip, knee and ankles bilaterally.  Very mild arthritic changes Left thumb exam does have a positive grind.  Atrophy noted of the thenar eminence.  Patient does have some bony deformities noted now of the thumb.  Grip strength though is symmetric to the contralateral side  Low back exam has loss of lordosis.  Tightness with loss of 5 degrees in all planes.  Positive Faber bilaterally secondary to more tightness.  Mild pain over the sacroiliac bilaterally tendon reflexes intact neurovascular intact distally.  Osteopathic findings C6 flexed rotated and side bent left T9 extended rotated and side bent left L2 flexed rotated and side bent right Sacrum right on right  Procedure: Real-time Ultrasound Guided Injection of left CMC joint Device: GE Logiq Q7 Ultrasound guided injection is preferred based studies that show increased duration, increased effect, greater accuracy, decreased procedural pain, increased response rate, and decreased cost with ultrasound guided versus blind injection.  Verbal informed consent obtained.  Time-out conducted.  Noted Tamara [name] overlying erythema, induration, or other signs of local infection.  Skin prepped  in a sterile fashion.  Local anesthesia: Topical Ethyl chloride.  With sterile technique and under real time ultrasound guidance: With a 25-gauge half inch needle patient was injected with 0.5 cc of 0.5% Marcaine and 0.5 cc of Kenalog 40 mg/mL Completed without difficulty  Pain immediately resolved suggesting accurate placement of the medication.  Advised to call if fevers/chills, erythema, induration, drainage, or persistent bleeding.  Images permanently stored and available for review in the ultrasound unit.  Impression: Technically successful ultrasound guided injection.    Impression and Recommendations:     This case required medical decision making of moderate complexity. The above documentation has been reviewed and is accurate and complete [name], DO       Note: This dictation was prepared with Dragon dictation along with smaller phrase technology. Any transcriptional errors that result from this process are unintentional.

## 2018-03-29 ENCOUNTER — Encounter: Payer: Self-pay | Admitting: Family Medicine

## 2018-03-29 NOTE — Assessment & Plan Note (Signed)
Known. Patient doing relatively well.  Avoid any type of surgical intervention if possible.  Follow-up with me again 6 weeks

## 2018-03-29 NOTE — Assessment & Plan Note (Signed)
Decision today to treat with OMT was based on Physical Exam  After verbal consent patient was treated with HVLA, ME, FPR techniques in cervical, thoracic, lumbar and sacral areas  Patient tolerated the procedure well with improvement in symptoms  Patient given exercises, stretches and lifestyle modifications  See medications in patient instructions if given  Patient will follow up in 6 weeks 

## 2018-03-29 NOTE — Assessment & Plan Note (Signed)
Patient given injection.  Tolerated the procedure well.  Patient wants to hold on any type of replacement surgery secondary to patient's young age as well.  We discussed continuing the bracing on a more regular basis.  Try to do some strengthening.  Follow-up again in 4 to 8 weeks

## 2018-04-02 ENCOUNTER — Encounter: Payer: Self-pay | Admitting: Family Medicine

## 2018-04-04 ENCOUNTER — Other Ambulatory Visit: Payer: Self-pay | Admitting: Internal Medicine

## 2018-04-04 NOTE — Telephone Encounter (Signed)
Appointment scheduled.

## 2018-04-10 ENCOUNTER — Other Ambulatory Visit: Payer: Self-pay | Admitting: Internal Medicine

## 2018-04-10 MED ORDER — VENLAFAXINE HCL ER 75 MG PO CP24: ORAL_CAPSULE | ORAL | 0 refills | Status: DC

## 2018-04-10 MED ORDER — MELOXICAM 15 MG PO TABS: 15.0000 mg | ORAL_TABLET | Freq: Every day | ORAL | 0 refills | Status: DC

## 2018-04-10 NOTE — Telephone Encounter (Signed)
Requested Prescriptions  Pending Prescriptions Disp Refills  . meloxicam (MOBIC) 15 MG tablet 90 tablet 0    Sig: Take 1 tablet (15 mg total) by mouth daily.     Analgesics:  COX2 Inhibitors Passed - 04/10/2018  9:23 AM      Passed - HGB in normal range and within 360 days    Hemoglobin  Date Value Ref Range Status  11/10/2017 14.1 12.0 - 15.0 g/dL Final         Passed - Cr in normal range and within 360 days    Creatinine, Ser  Date Value Ref Range Status  11/10/2017 0.74 0.40 - 1.20 mg/dL Final         Passed - Patient is not pregnant      Passed - Valid encounter within last 12 months    Recent Outpatient Visits          1 week ago Chronic pain of left thumb   [place], Olevia Bowens, DO   2 months ago Nonallopathic lesion of lumbosacral region   [place], Olevia Bowens, DO   3 months ago Nonallopathic lesion of thoracic region   [place], Olevia Bowens, DO   5 months ago Preventative health care   [place], James W, MD   5 months ago Nonallopathic lesion of lumbosacral region   [employer] [place], [place], DO      Future Appointments            In 2 days [name], [place], [place]   In 4 weeks [name], [place], [place]   In 7 months Jenny Reichmann, Hunt Oris, MD [place], [facility]         Signed Prescriptions Disp Refills   venlafaxine XR (EFFEXOR-XR) 75 MG 24 hr capsule 90 capsule 0    Sig: TAKE 1 CAPSULE(75 MG) BY MOUTH DAILY WITH BREAKFAST     Psychiatry: Antidepressants - SNRI - desvenlafaxine & venlafaxine Failed - 04/10/2018  9:23 AM      Failed - Last BP in normal range    BP Readings from Last 1 Encounters:  03/28/18 (!) 120/92         Passed - LDL in normal range and within 360 days    LDL Cholesterol  Date  Value Ref Range Status  11/10/2017 79 0 - 99 mg/dL Final         Passed - Total Cholesterol in normal range and within 360 days    Cholesterol  Date Value Ref Range Status  11/10/2017 182 0 - 200 mg/dL Final    Comment:    ATP III Classification       Desirable:  < 200 mg/dL               Borderline High:  200 - 239 mg/dL          High:  > = 240 mg/dL         Passed - Triglycerides in normal range and within 360 days    Triglycerides  Date Value Ref Range Status  11/10/2017 83.0 0.0 - 149.0 mg/dL Final    Comment:    Normal:  <150 mg/dLBorderline High:  150 - 199 mg/dL         Passed - Completed PHQ-2 or PHQ-9 in the  last 360 days.      Passed - Valid encounter within last 6 months    Recent Outpatient Visits          1 week ago Chronic pain of left thumb   [place], [place], DO   2 months ago Nonallopathic lesion of lumbosacral region   [place], [place], DO   3 months ago Nonallopathic lesion of thoracic region   [place], Olevia Bowens, DO   5 months ago Preventative health care   [facility] -Georges Mouse, MD   5 months ago Nonallopathic lesion of lumbosacral region   [place], [place], DO      Future Appointments            In 2 days [name], [place], [place]   In 4 weeks [name], [place], [place]   In 7 months Jenny Reichmann, Hunt Oris, MD [place], [facility]

## 2018-04-10 NOTE — Telephone Encounter (Signed)
Copied from [place] [id number]. Topic: Quick Communication - Rx Refill/Question >> Apr 10, 2018  9:12 AM Antonieta Iba C wrote: Medication: meloxicam (MOBIC) 15 MG tablet  and also venlafaxine XR (EFFEXOR-XR) 75 MG 24 hr capsule - 90 day supply.   Has the patient contacted their pharmacy? Yes   (Agent: If no, request that the patient contact the pharmacy for the refill.) (Agent: If yes, when and what did the pharmacy advise?)  Preferred Pharmacy (with phone number or street name): [facility] [phone] - [place], [place] - [address] [phone] (Phone) [phone] (Fax)    Agent: Please be advised that RX refills may take up to 3 business days. We ask that you follow-up with your pharmacy.

## 2018-04-10 NOTE — Telephone Encounter (Signed)
Requested Prescriptions  Pending Prescriptions Disp Refills  . meloxicam (MOBIC) 15 MG tablet 90 tablet 0    Sig: Take 1 tablet (15 mg total) by mouth daily.     Analgesics:  COX2 Inhibitors Passed - 04/10/2018  9:23 AM      Passed - HGB in normal range and within 360 days    Hemoglobin  Date Value Ref Range Status  11/10/2017 14.1 12.0 - 15.0 g/dL Final         Passed - Cr in normal range and within 360 days    Creatinine, Ser  Date Value Ref Range Status  11/10/2017 0.74 0.40 - 1.20 mg/dL Final         Passed - Patient is not pregnant      Passed - Valid encounter within last 12 months    Recent Outpatient Visits          1 week ago Chronic pain of left thumb   [place], [place], DO   2 months ago Nonallopathic lesion of lumbosacral region   [place], [place], DO   3 months ago Nonallopathic lesion of thoracic region   [place], Olevia Bowens, DO   5 months ago Preventative health care   [facility] -Georges Mouse, MD   5 months ago Nonallopathic lesion of lumbosacral region   [place], Olevia Bowens, DO      Future Appointments            In 2 days [name], [place], [place]   In 4 weeks [name], [place], [place]   In 7 months Jenny Reichmann, Hunt Oris, MD [place], [place]         . venlafaxine XR (EFFEXOR-XR) 75 MG 24 hr capsule 90 capsule 0    Sig: TAKE 1 CAPSULE(75 MG) BY MOUTH DAILY WITH BREAKFAST     Psychiatry: Antidepressants - SNRI - desvenlafaxine & venlafaxine Failed - 04/10/2018  9:23 AM      Failed - Last BP in normal range    BP Readings from Last 1 Encounters:  03/28/18 (!) 120/92         Passed - LDL in normal range and within 360 days    LDL Cholesterol  Date Value Ref Range Status  11/10/2017 79  0 - 99 mg/dL Final         Passed - Total Cholesterol in normal range and within 360 days    Cholesterol  Date Value Ref Range Status  11/10/2017 182 0 - 200 mg/dL Final    Comment:    ATP III Classification       Desirable:  < 200 mg/dL               Borderline High:  200 - 239 mg/dL          High:  > = 240 mg/dL         Passed - Triglycerides in normal range and within 360 days    Triglycerides  Date Value Ref Range Status  11/10/2017 83.0 0.0 - 149.0 mg/dL Final    Comment:    Normal:  <150 mg/dLBorderline High:  150 - 199 mg/dL         Passed - Completed PHQ-2 or PHQ-9 in the last 360 days.  Passed - Valid encounter within last 6 months    Recent Outpatient Visits          1 week ago Chronic pain of left thumb   [place], [place], DO   2 months ago Nonallopathic lesion of lumbosacral region   [place], [place], DO   3 months ago Nonallopathic lesion of thoracic region   [place], Olevia Bowens, DO   5 months ago Preventative health care   [facility] -Georges Mouse, MD   5 months ago Nonallopathic lesion of lumbosacral region   [place], [place], DO      Future Appointments            In 2 days [name], [place], [place]   In 4 weeks [name], [place], [place]   In 7 months Jenny Reichmann, Hunt Oris, MD [place], [facility]

## 2018-04-12 ENCOUNTER — Encounter: Payer: Self-pay | Admitting: Family Medicine

## 2018-04-12 ENCOUNTER — Ambulatory Visit (INDEPENDENT_AMBULATORY_CARE_PROVIDER_SITE_OTHER): Payer: 59 | Admitting: Family Medicine

## 2018-04-12 ENCOUNTER — Ambulatory Visit: Payer: Self-pay

## 2018-04-12 VITALS — BP 118/90 | HR 96 | Resp 97 | Ht 61.0 in

## 2018-04-12 DIAGNOSIS — G8929 Other chronic pain: Secondary | ICD-10-CM

## 2018-04-12 DIAGNOSIS — M1812 Unilateral primary osteoarthritis of first carpometacarpal joint, left hand: Secondary | ICD-10-CM

## 2018-04-12 DIAGNOSIS — M79645 Pain in left finger(s): Secondary | ICD-10-CM

## 2018-04-12 NOTE — Progress Notes (Signed)
[name] [address] [place], [place] [address] Phone: [phone] Subjective:   I Tamara [name] am serving as a scribe for Dr. Hulan Saas. :    CC: Left thumb pain  [id number]    03/28/2018 Patient given injection.  Tolerated the procedure well.  Patient wants to hold on any type of replacement surgery secondary to patient's young age as well.  We discussed continuing the bracing on a more regular basis.  Try to do some strengthening.  Follow-up again in 4 to 8 weeks  Updated 04/12/2018 Tamara [name] is a 58 y.o. female coming in with complaint of left thumb pain. Here for PRP.  Has known CMC arthritis.  Has not responded as well to the injections as previously.      Past Medical History:  Diagnosis Date  . Allergic rhinitis, mild   . Anemia   . Depression   . Hypertension   . Ovarian cyst    Past Surgical History:  Procedure Laterality Date  . LAMINECTOMY    . LUMBAR EPIDURAL INJECTION  11/03/2014   Social History   Socioeconomic History  . Marital status: Married    Spouse name: Not on file  . Number of children: 0  . Years of education: Not on file  . Highest education level: Not on file  Occupational History  . Not on file  Social Needs  . Financial resource strain: Not on file  . Food insecurity:    Worry: Not on file    Inability: Not on file  . Transportation needs:    Medical: Not on file    Non-medical: Not on file  Tobacco Use  . Smoking status: Never Smoker  . Smokeless tobacco: Never Used  Substance and Sexual Activity  . Alcohol use: Yes    Alcohol/week: 3.0 standard drinks    Types: 3 Standard drinks or equivalent per week  . Drug use: No  . Sexual activity: Yes    Partners: Male    Birth control/protection: I.U.D.  Lifestyle  . Physical activity:    Days per week: Not on file    Minutes per session: Not on file  . Stress: Not on file  Relationships  . Social connections:    Talks on  phone: Not on file    Gets together: Not on file    Attends religious service: Not on file    Active member of club or organization: Not on file    Attends meetings of clubs or organizations: Not on file    Relationship status: Not on file  Other Topics Concern  . Not on file  Social History Narrative  . Not on file   Allergies  Allergen Reactions  . Gabapentin Swelling   Family History  Problem Relation Age of Onset  . Depression Mother   . Depression Father   . Alcohol abuse Father      Current Outpatient Medications (Cardiovascular):  .  losartan (COZAAR) 100 MG tablet, Take 1 tablet (100 mg total) by mouth daily.  Current Outpatient Medications (Respiratory):  .  fluticasone (FLONASE) 50 MCG/ACT nasal spray, Place 2 sprays into both nostrils daily.  Current Outpatient Medications (Analgesics):  .  meloxicam (MOBIC) 15 MG tablet, Take 1 tablet (15 mg total) by mouth daily.   Current Outpatient Medications (Other):  Marland Kitchen  ALPRAZolam (XANAX) 0.5 MG tablet, TAKE 1 TABLET BY MOUTH THREE TIMES DAILY AS NEEDED FOR ANXIETY .  hydrOXYzine (ATARAX/VISTARIL) 10 MG  tablet, Take 1 tablet (10 mg total) by mouth every 8 (eight) hours as needed. .  venlafaxine XR (EFFEXOR-XR) 75 MG 24 hr capsule, TAKE 1 CAPSULE(75 MG) BY MOUTH DAILY WITH BREAKFAST    Past medical history, social, surgical and family history all reviewed in electronic medical record.  No pertanent information unless stated regarding to the chief complaint.   Review of Systems:  No headache, visual changes, nausea, vomiting, diarrhea, constipation, dizziness, abdominal pain, skin rash, fevers, chills, night sweats, weight loss, swollen lymph nodes, body aches, joint swelling,  chest pain, shortness of breath, mood changes.  Positive muscle aches  Objective  Blood pressure 118/90, pulse 96, resp. rate (!) 97, height 5\' 1"  (1.549 m).    General: No apparent distress alert and oriented x3 mood and affect normal, dressed  appropriately.  HEENT: Pupils equal, extraocular movements intact  Respiratory: Patient's speak in full sentences and does not appear short of breath  Cardiovascular: No lower extremity edema, non tender, no erythema  Skin: Warm dry intact with no signs of infection or rash on extremities or on axial skeleton.  Abdomen: Soft nontender  Neuro: Cranial nerves II through XII are intact, neurovascularly intact in all extremities with 2+ DTRs and 2+ pulses.  Lymph: No lymphadenopathy of posterior or anterior cervical chain or axillae bilaterally.  Gait normal with good balance and coordination.  MSK:  Non tender with full range of motion and good stability and symmetric strength and tone of shoulders, elbows,  hip, knee and ankles bilaterally.  Mild arthritic changes of multiple joints  Patient's left thumb does have positive grind.  Atrophy noted of the thenar eminence compared to the contralateral side  Procedure: Real-time Ultrasound Guided Injection of left CMC joint Device: GE Logiq Q7 Ultrasound guided injection is preferred based studies that show increased duration, increased effect, greater accuracy, decreased procedural pain, increased response rate, and decreased cost with ultrasound guided versus blind injection.  Verbal informed consent obtained.  Time-out conducted.  Noted no overlying erythema, induration, or other signs of local infection.  Skin prepped in a sterile fashion.  Local anesthesia: Topical Ethyl chloride.  With sterile technique and under real time ultrasound guidance: 25-gauge half inch needle injected with 0.1 cc of 0.5% Marcaine then injected with 2 cc of pre-centrifuge PRP Completed without difficulty  Pain intensified initially right afterwards but then called patient's 30 minutes after procedure and patient was doing much better Advised to call if fevers/chills, erythema, induration, drainage, or persistent bleeding.  Images permanently stored and available for  review in the ultrasound unit.  Impression: Technically successful ultrasound guided injection.    Impression and Recommendations:     This case required medical decision making of moderate complexity. The above documentation has been reviewed and is accurate and complete Hulan Saas, DO.      Note: This dictation was prepared with Dragon dictation along with smaller phrase technology. Any transcriptional errors that result from this process are unintentional.        .t

## 2018-04-12 NOTE — Assessment & Plan Note (Signed)
Patient has been dealing with this pain for over nearly a year.  Discussed heat regimen and home exercise.  Discussed which activities of doing which was to avoid.  Patient is to increase activity slowly over the course the next several days.  Follow-up again in 3 weeks.

## 2018-04-13 ENCOUNTER — Telehealth: Payer: Self-pay | Admitting: Internal Medicine

## 2018-04-13 ENCOUNTER — Telehealth: Payer: Self-pay

## 2018-04-13 NOTE — Telephone Encounter (Signed)
Copied from [place] [id number]. Topic: Quick Communication - See Telephone Encounter >> Apr 13, 2018  1:10 PM Blase Mess A wrote: CRM for notification. See Telephone encounter for: 04/13/18. Patient is calling to report to Dr. Tamala Julian that her hand is swollen & Sore but not as swollen and sore as after the procedure.Thank you [phone]

## 2018-04-13 NOTE — Telephone Encounter (Signed)
Called to check on patient after PRP injection in thumb. Left message to call back [phone]

## 2018-04-29 ENCOUNTER — Other Ambulatory Visit: Payer: Self-pay | Admitting: Internal Medicine

## 2018-05-01 ENCOUNTER — Encounter: Payer: Self-pay | Admitting: Family Medicine

## 2018-05-02 ENCOUNTER — Other Ambulatory Visit: Payer: Self-pay | Admitting: Internal Medicine

## 2018-05-03 NOTE — Telephone Encounter (Signed)
Tamara [name] pt 

## 2018-05-03 NOTE — Telephone Encounter (Signed)
Done erx 

## 2018-05-09 ENCOUNTER — Ambulatory Visit: Payer: 59 | Admitting: Family Medicine

## 2018-05-31 ENCOUNTER — Ambulatory Visit: Payer: 59 | Admitting: Family Medicine

## 2018-05-31 ENCOUNTER — Encounter: Payer: Self-pay | Admitting: Family Medicine

## 2018-06-01 MED ORDER — HYDROXYZINE HCL 10 MG PO TABS: 10.0000 mg | ORAL_TABLET | Freq: Three times a day (TID) | ORAL | 3 refills | Status: DC | PRN

## 2018-06-01 MED ORDER — VITAMIN D (ERGOCALCIFEROL) 1.25 MG (50000 UNIT) PO CAPS: 50000.0000 [IU] | ORAL_CAPSULE | ORAL | 0 refills | Status: DC

## 2018-06-19 ENCOUNTER — Encounter: Payer: Self-pay | Admitting: Family Medicine

## 2018-06-21 ENCOUNTER — Ambulatory Visit: Payer: 59 | Admitting: Family Medicine

## 2018-06-25 NOTE — Progress Notes (Signed)
[name] [address], [place] [address] Phone: [phone] Subjective:   Tamara Tamara [name], am serving as a scribe for Dr. Hulan Saas.  I'm seeing this patient by the request  of:    CC: Back pain and left hand pain follow-up  [id number]   04/12/2018: Patient has been dealing with this pain for over nearly a year.  Discussed heat regimen and home exercise.  Discussed which activities of doing which was to avoid.  Patient is to increase activity slowly over the course the next several days.  Follow-up again in 3 weeks.  Update 06/28/2018: Tamara Tamara [name] is a 58 y.o. female coming in with complaint of left hand and back pain. Is having back tightness as she has not been able to come in due to Covid.   Continues to have left thumb pain. States that her thumb works "weird." Has been gardening and bottle feeding which has exacerbated her pain. Does want to note that she broke her thumb in 7th grade.       Past Medical History:  Diagnosis Date  . Allergic rhinitis, mild   . Anemia   . Depression   . Hypertension   . Ovarian cyst    Past Surgical History:  Procedure Laterality Date  . LAMINECTOMY    . LUMBAR EPIDURAL INJECTION  11/03/2014   Social History   Socioeconomic History  . Marital status: Married    Spouse name: Not on file  . Number of children: 0  . Years of education: Not on file  . Highest education level: Not on file  Occupational History  . Not on file  Social Needs  . Financial resource strain: Not on file  . Food insecurity:    Worry: Not on file    Inability: Not on file  . Transportation needs:    Medical: Not on file    Non-medical: Not on file  Tobacco Use  . Smoking status: Never Smoker  . Smokeless tobacco: Never Used  Substance and Sexual Activity  . Alcohol use: Yes    Alcohol/week: 3.0 standard drinks    Types: 3 Standard drinks or equivalent per week  . Drug use: Tamara [name]  . Sexual activity:  Yes    Partners: Male    Birth control/protection: I.U.D.  Lifestyle  . Physical activity:    Days per week: Not on file    Minutes per session: Not on file  . Stress: Not on file  Relationships  . Social connections:    Talks on phone: Not on file    Gets together: Not on file    Attends religious service: Not on file    Active member of club or organization: Not on file    Attends meetings of clubs or organizations: Not on file    Relationship status: Not on file  Other Topics Concern  . Not on file  Social History Narrative  . Not on file   Allergies  Allergen Reactions  . Gabapentin Swelling   Family History  Problem Relation Age of Onset  . Depression Mother   . Depression Father   . Alcohol abuse Father      Current Outpatient Medications (Cardiovascular):  .  losartan (COZAAR) 100 MG tablet, TAKE 1 TABLET BY MOUTH EVERY DAY  Current Outpatient Medications (Respiratory):  .  fluticasone (FLONASE) 50 MCG/ACT nasal spray, Place 2 sprays into both nostrils daily.  Current Outpatient Medications (Analgesics):  .  meloxicam (  MOBIC) 15 MG tablet, Take 1 tablet (15 mg total) by mouth daily. .  celecoxib (CELEBREX) 200 MG capsule, One to 2 tablets by mouth daily as needed for pain.   Current Outpatient Medications (Other):  Marland Kitchen  ALPRAZolam (XANAX) 0.5 MG tablet, TAKE 1 TABLET BY MOUTH THREE TIMES DAILY AS NEEDED FOR ANXIETY .  hydrOXYzine (ATARAX/VISTARIL) 10 MG tablet, Take 1 tablet (10 mg total) by mouth every 8 (eight) hours as needed. .  venlafaxine XR (EFFEXOR-XR) 75 MG 24 hr capsule, TAKE 1 CAPSULE(75 MG) BY MOUTH DAILY WITH BREAKFAST .  Vitamin D, Ergocalciferol, (DRISDOL) 1.25 MG (50000 UT) CAPS capsule, Take 1 capsule (50,000 Units total) by mouth every 7 (seven) days.    Past medical history, social, surgical and family history all reviewed in electronic medical record.  Tamara [name] pertanent information unless stated regarding to the chief complaint.   Review of  Systems:  Tamara [name] headache, visual changes, nausea, vomiting, diarrhea, constipation, dizziness, abdominal pain, skin rash, fevers, chills, night sweats, weight loss, swollen lymph nodes, body aches, joint swelling,  chest pain, shortness of breath, mood changes.  Positive muscle aches  Objective  Blood pressure 122/82, pulse 99, height 5\' 1"  (1.549 m), weight 161 lb (73 kg), SpO2 99 %.    General: Tamara [name] apparent distress alert and oriented x3 mood and affect normal, dressed appropriately.  HEENT: Pupils equal, extraocular movements intact  Respiratory: Patient's speak in full sentences and does not appear short of breath  Cardiovascular: Tamara [name] lower extremity edema, non tender, Tamara [name] erythema  Skin: Warm dry intact with Tamara [name] signs of infection or rash on extremities or on axial skeleton.  Abdomen: Soft nontender  Neuro: Cranial nerves II through XII are intact, neurovascularly intact in all extremities with 2+ DTRs and 2+ pulses.  Lymph: Tamara [name] lymphadenopathy of posterior or anterior cervical chain or axillae bilaterally.  Gait normal with good balance and coordination.  MSK:  Non tender with full range of motion and good stability and symmetric strength and tone of shoulders, elbows, wrist, hip, knee and ankles bilaterally.  CMC arthritis still noted of the left Tamara [name] gross deformity, swelling, bruising. Has loss of lordosis and decreased range of motion in all planes.  Mild crepitus   Back -also lordosis noted.  Tightness noted.  Positive Faber bilaterally.  Negative straight leg test.  5 out of 5 strength of the lower extremities with 2+ DTRs   Osteopathic findings C2 flexed rotated and side bent right C6 flexed rotated and side bent left T3 extended rotated and side bent right inhaled third rib T7 extended rotated and side bent left L2 flexed rotated and side bent right Sacrum right on right    Impression and Recommendations:     This case required medical decision making of moderate complexity.  The above documentation has been reviewed and is accurate and complete Tamara Pulley, DO       Note: This dictation was prepared with Dragon dictation along with smaller phrase technology. Any transcriptional errors that result from this process are unintentional.

## 2018-06-28 ENCOUNTER — Ambulatory Visit: Payer: 59 | Admitting: Family Medicine

## 2018-06-28 ENCOUNTER — Encounter: Payer: Self-pay | Admitting: Family Medicine

## 2018-06-28 ENCOUNTER — Other Ambulatory Visit: Payer: Self-pay

## 2018-06-28 VITALS — BP 122/82 | HR 99 | Ht 61.0 in | Wt 161.0 lb

## 2018-06-28 DIAGNOSIS — M4722 Other spondylosis with radiculopathy, cervical region: Secondary | ICD-10-CM

## 2018-06-28 DIAGNOSIS — M999 Biomechanical lesion, unspecified: Secondary | ICD-10-CM

## 2018-06-28 DIAGNOSIS — M5416 Radiculopathy, lumbar region: Secondary | ICD-10-CM | POA: Diagnosis not present

## 2018-06-28 MED ORDER — CELECOXIB 200 MG PO CAPS: ORAL_CAPSULE | ORAL | 2 refills | Status: DC

## 2018-06-28 NOTE — Patient Instructions (Addendum)
Great to see you  Be safe  Celebrex up to 2 times a day instead of the meloxicam  Stay active See me again in 5-6 weeks

## 2018-06-28 NOTE — Assessment & Plan Note (Signed)
Decision today to treat with OMT was based on Physical Exam  After verbal consent patient was treated with HVLA, ME, FPR techniques in cervical, thoracic, lumbar and sacral areas  Patient tolerated the procedure well with improvement in symptoms  Patient given exercises, stretches and lifestyle modifications  See medications in patient instructions if given  Patient will follow up in 4-8 weeks 

## 2018-06-28 NOTE — Assessment & Plan Note (Signed)
Likely no radicular symptoms.  Did respond well to the epidural but patient's insurance does not seem to cover it.  Wants to avoid any surgical intervention.  Responding fairly well to osteopathic manipulation.  We discussed icing regimen and home exercise.  We discussed which activities of doing which wants to avoid.  Follow-up again in 4 to 8 weeks

## 2018-07-04 ENCOUNTER — Encounter: Payer: Self-pay | Admitting: Family Medicine

## 2018-07-12 ENCOUNTER — Encounter: Payer: Self-pay | Admitting: Internal Medicine

## 2018-07-12 ENCOUNTER — Other Ambulatory Visit: Payer: Self-pay | Admitting: Internal Medicine

## 2018-07-12 MED ORDER — VENLAFAXINE HCL ER 75 MG PO CP24: ORAL_CAPSULE | ORAL | 0 refills | Status: DC

## 2018-08-09 ENCOUNTER — Ambulatory Visit: Payer: 59 | Admitting: Family Medicine

## 2018-08-09 ENCOUNTER — Other Ambulatory Visit: Payer: Self-pay

## 2018-08-09 ENCOUNTER — Encounter: Payer: Self-pay | Admitting: Family Medicine

## 2018-08-09 VITALS — BP 122/80 | HR 96 | Ht 61.0 in | Wt 162.0 lb

## 2018-08-09 DIAGNOSIS — M5416 Radiculopathy, lumbar region: Secondary | ICD-10-CM | POA: Diagnosis not present

## 2018-08-09 DIAGNOSIS — M999 Biomechanical lesion, unspecified: Secondary | ICD-10-CM

## 2018-08-09 NOTE — Progress Notes (Signed)
[name] [address] [place], [place] [address] Phone: [phone] Subjective:   [name], am serving as a scribe for Dr. Hulan Saas.   CC: Back pain neck pain  [id number]  MYLENE BOW is a 58 y.o. female coming in with complaint of neck pain. Always has an achy sensation.  Is taking celebrex. Does have minimal improvement in her wrist from the medication. Is wearing a brace for wrist today. Did notice that she had an increase in pain with bottle feeding some kittens. She is done with that for now. Pain has improved slightly.   Patient states that the back and neck pain is constant.  Never without some type of discomfort and pain.  Patient no denies any radiation to any of the extremities at the moment.  Not stopping her from activity but does feel the oral anti-inflammatories sometimes does help.  Past Medical History:  Diagnosis Date  . Allergic rhinitis, mild   . Anemia   . Depression   . Hypertension   . Ovarian cyst    Past Surgical History:  Procedure Laterality Date  . LAMINECTOMY    . LUMBAR EPIDURAL INJECTION  11/03/2014   Social History   Socioeconomic History  . Marital status: Married    Spouse name: Not on file  . Number of children: 0  . Years of education: Not on file  . Highest education level: Not on file  Occupational History  . Not on file  Social Needs  . Financial resource strain: Not on file  . Food insecurity    Worry: Not on file    Inability: Not on file  . Transportation needs    Medical: Not on file    Non-medical: Not on file  Tobacco Use  . Smoking status: Never Smoker  . Smokeless tobacco: Never Used  Substance and Sexual Activity  . Alcohol use: Yes    Alcohol/week: 3.0 standard drinks    Types: 3 Standard drinks or equivalent per week  . Drug use: No  . Sexual activity: Yes    Partners: Male    Birth control/protection: I.U.D.  Lifestyle  . Physical activity    Days per  week: Not on file    Minutes per session: Not on file  . Stress: Not on file  Relationships  . Social Herbalist on phone: Not on file    Gets together: Not on file    Attends religious service: Not on file    Active member of club or organization: Not on file    Attends meetings of clubs or organizations: Not on file    Relationship status: Not on file  Other Topics Concern  . Not on file  Social History Narrative  . Not on file   Allergies  Allergen Reactions  . Gabapentin Swelling   Family History  Problem Relation Age of Onset  . Depression Mother   . Depression Father   . Alcohol abuse Father      Current Outpatient Medications (Cardiovascular):  .  losartan (COZAAR) 100 MG tablet, TAKE 1 TABLET BY MOUTH EVERY DAY  Current Outpatient Medications (Respiratory):  .  fluticasone (FLONASE) 50 MCG/ACT nasal spray, Place 2 sprays into both nostrils daily.  Current Outpatient Medications (Analgesics):  .  celecoxib (CELEBREX) 200 MG capsule, One to 2 tablets by mouth daily as needed for pain. .  meloxicam (MOBIC) 15 MG tablet, Take 1 tablet (15 mg total)  by mouth daily.   Current Outpatient Medications (Other):  Marland Kitchen  ALPRAZolam (XANAX) 0.5 MG tablet, TAKE 1 TABLET BY MOUTH THREE TIMES DAILY AS NEEDED FOR ANXIETY .  hydrOXYzine (ATARAX/VISTARIL) 10 MG tablet, Take 1 tablet (10 mg total) by mouth every 8 (eight) hours as needed. .  venlafaxine XR (EFFEXOR-XR) 75 MG 24 hr capsule, TAKE 1 CAPSULE(75 MG) BY MOUTH DAILY WITH BREAKFAST .  Vitamin D, Ergocalciferol, (DRISDOL) 1.25 MG (50000 UT) CAPS capsule, Take 1 capsule (50,000 Units total) by mouth every 7 (seven) days.    Past medical history, social, surgical and family history all reviewed in electronic medical record.  No pertanent information unless stated regarding to the chief complaint.   Review of Systems:  No headache, visual changes, nausea, vomiting, diarrhea, constipation, dizziness, abdominal pain,  skin rash, fevers, chills, night sweats, weight loss, swollen lymph nodes, body aches, joint swelling,  chest pain, shortness of breath, mood changes.  Positive muscle aches  Objective  Blood pressure 122/80, pulse 96, height 5\' 1"  (1.549 m), weight 162 lb (73.5 kg), SpO2 98 %.    General: No apparent distress alert and oriented x3 mood and affect normal, dressed appropriately.  HEENT: Pupils equal, extraocular movements intact  Respiratory: Patient's speak in full sentences and does not appear short of breath  Cardiovascular: No lower extremity edema, non tender, no erythema  Skin: Warm dry intact with no signs of infection or rash on extremities or on axial skeleton.  Abdomen: Soft nontender  Neuro: Cranial nerves II through XII are intact, neurovascularly intact in all extremities with 2+ DTRs and 2+ pulses.  Lymph: No lymphadenopathy of posterior or anterior cervical chain or axillae bilaterally.  Gait normal with good balance and coordination.  MSK:  Non tender with full range of motion and good stability and symmetric strength and tone of shoulders, elbows, wrist, hip, knee and ankles bilaterally.    Back - Normal skin, Spine with normal alignment and no deformity.  No tenderness to vertebral process palpation.  Paraspinous muscles are not tender and without spasm.   Range of motion is full at neck and lumbar sacral regions  Neck exam no gross deformity, swelling, bruising. TTP .  No midline/bony TTP. Limited range of motion in the neck lacking the last 5 degrees of extension and sidebending bilaterally. BUE strength 5/5.   Sensation intact to light touch.   2+ equal reflexes in triceps, biceps, brachioradialis tendons. Negative spurlings. NV intact distal BUEs.  Osteopathic findings  C2 flexed rotated and side bent right C6 flexed rotated and side bent left T7 extended rotated and side bent left L1 flexed rotated and side bent right Sacrum right on right    Impression and  Recommendations:     This case required medical decision making of moderate complexity. The above documentation has been reviewed and is accurate and complete [name], DO       Note: This dictation was prepared with Dragon dictation along with smaller phrase technology. Any transcriptional errors that result from this process are unintentional.

## 2018-08-10 ENCOUNTER — Encounter: Payer: Self-pay | Admitting: Family Medicine

## 2018-08-10 NOTE — Assessment & Plan Note (Signed)
Patient continues on tightness of the back.  No radicular symptoms at the moment.  We discussed posture and ergonomics.  Patient does not want to do formal physical therapy.  Patient does feel like she responds fairly well to osteopathic manipulation.  No change in medications.  Follow-up again in 4 to 8 weeks

## 2018-08-10 NOTE — Assessment & Plan Note (Signed)
Decision today to treat with OMT was based on Physical Exam  After verbal consent patient was treated with HVLA, ME, FPR techniques in cervical, thoracic, rib lumbar and sacral areas  Patient tolerated the procedure well with improvement in symptoms  Patient given exercises, stretches and lifestyle modifications  See medications in patient instructions if given  Patient will follow up in 4-8 weeks 

## 2018-09-13 ENCOUNTER — Encounter: Payer: Self-pay | Admitting: Family Medicine

## 2018-09-13 ENCOUNTER — Other Ambulatory Visit: Payer: Self-pay

## 2018-09-13 ENCOUNTER — Ambulatory Visit: Payer: 59 | Admitting: Family Medicine

## 2018-09-13 VITALS — BP 100/70 | HR 94 | Ht 61.0 in | Wt 162.0 lb

## 2018-09-13 DIAGNOSIS — M999 Biomechanical lesion, unspecified: Secondary | ICD-10-CM

## 2018-09-13 DIAGNOSIS — M4722 Other spondylosis with radiculopathy, cervical region: Secondary | ICD-10-CM

## 2018-09-13 NOTE — Progress Notes (Signed)
[name] [address] [place], [place] [address] Phone: [phone] Subjective:   I, [name], am serving as a scribe for Dr. Hulan Saas.    CC:  Neck and back pain   [id number]  Tamara [name] is a 58 y.o. female coming in with complaint of neck, back and hand pain. States her neck and back feel ok. Hand is still feeling the same. Has a brace today.   Onset-  Location Duration-  Character- Aggravating factors- Reliving factors-  Therapies tried-  Severity-     Past Medical History:  Diagnosis Date  . Allergic rhinitis, mild   . Anemia   . Depression   . Hypertension   . Ovarian cyst    Past Surgical History:  Procedure Laterality Date  . LAMINECTOMY    . LUMBAR EPIDURAL INJECTION  11/03/2014   Social History   Socioeconomic History  . Marital status: Married    Spouse name: Not on file  . Number of children: 0  . Years of education: Not on file  . Highest education level: Not on file  Occupational History  . Not on file  Social Needs  . Financial resource strain: Not on file  . Food insecurity    Worry: Not on file    Inability: Not on file  . Transportation needs    Medical: Not on file    Non-medical: Not on file  Tobacco Use  . Smoking status: Never Smoker  . Smokeless tobacco: Never Used  Substance and Sexual Activity  . Alcohol use: Yes    Alcohol/week: 3.0 standard drinks    Types: 3 Standard drinks or equivalent per week  . Drug use: No  . Sexual activity: Yes    Partners: Male    Birth control/protection: I.U.D.  Lifestyle  . Physical activity    Days per week: Not on file    Minutes per session: Not on file  . Stress: Not on file  Relationships  . Social Herbalist on phone: Not on file    Gets together: Not on file    Attends religious service: Not on file    Active member of club or organization: Not on file    Attends meetings of clubs or organizations: Not on file   Relationship status: Not on file  Other Topics Concern  . Not on file  Social History Narrative  . Not on file   Allergies  Allergen Reactions  . Gabapentin Swelling   Family History  Problem Relation Age of Onset  . Depression Mother   . Depression Father   . Alcohol abuse Father      Current Outpatient Medications (Cardiovascular):  .  losartan (COZAAR) 100 MG tablet, TAKE 1 TABLET BY MOUTH EVERY DAY  Current Outpatient Medications (Respiratory):  .  fluticasone (FLONASE) 50 MCG/ACT nasal spray, Place 2 sprays into both nostrils daily.  Current Outpatient Medications (Analgesics):  .  celecoxib (CELEBREX) 200 MG capsule, One to 2 tablets by mouth daily as needed for pain. .  meloxicam (MOBIC) 15 MG tablet, Take 1 tablet (15 mg total) by mouth daily.   Current Outpatient Medications (Other):  Marland Kitchen  ALPRAZolam (XANAX) 0.5 MG tablet, TAKE 1 TABLET BY MOUTH THREE TIMES DAILY AS NEEDED FOR ANXIETY .  hydrOXYzine (ATARAX/VISTARIL) 10 MG tablet, Take 1 tablet (10 mg total) by mouth every 8 (eight) hours as needed. .  venlafaxine XR (EFFEXOR-XR) 75 MG 24 hr capsule,  TAKE 1 CAPSULE(75 MG) BY MOUTH DAILY WITH BREAKFAST .  Vitamin D, Ergocalciferol, (DRISDOL) 1.25 MG (50000 UT) CAPS capsule, Take 1 capsule (50,000 Units total) by mouth every 7 (seven) days.    Past medical history, social, surgical and family history all reviewed in electronic medical record.  No pertanent information unless stated regarding to the chief complaint.   Review of Systems:  No headache, visual changes, nausea, vomiting, diarrhea, constipation, dizziness, abdominal pain, skin rash, fevers, chills, night sweats, weight loss, swollen lymph nodes, body aches, joint swelling, muscle aches, chest pain, shortness of breath, mood changes.   Objective  Blood pressure 100/70, pulse 94, height 5\' 1"  (1.549 m), weight 162 lb (73.5 kg), SpO2 98 %. Systems examined below as of    General: No apparent distress alert  and oriented x3 mood and affect normal, dressed appropriately.  HEENT: Pupils equal, extraocular movements intact  Respiratory: Patient's speak in full sentences and does not appear short of breath  Cardiovascular: No lower extremity edema, non tender, no erythema  Skin: Warm dry intact with no signs of infection or rash on extremities or on axial skeleton.  Abdomen: Soft nontender  Neuro: Cranial nerves II through XII are intact, neurovascularly intact in all extremities with 2+ DTRs and 2+ pulses.  Lymph: No lymphadenopathy of posterior or anterior cervical chain or axillae bilaterally.  Gait normal with good balance and coordination.  MSK:  Non tender with full range of motion and good stability and symmetric strength and tone of shoulders, elbows, wrist, hip, knee and ankles bilaterally.  Moderate arthritic changes of multiple joints   Neck pain does have some loss of lordosis, significant tightness in the trapezius bilaterally.  Mild radicular symptoms down the left arm with Spurling's. Low back exam does have tightness of the paraspinal musculature with loss of lordosis.  Negative straight leg test but tightness of hamstrings as well as positive Corky Sox on the right side  Osteopathic findings  C2 flexed rotated and side bent right \ T9 extended rotated and side bent left L2 flexed rotated and side bent right Sacrum right on right  Impression and Recommendations:     This case required medical decision making of moderate complexity. The above documentation has been reviewed and is accurate and complete [name], DO       Note: This dictation was prepared with Dragon dictation along with smaller phrase technology. Any transcriptional errors that result from this process are unintentional.

## 2018-09-13 NOTE — Assessment & Plan Note (Signed)
Decision today to treat with OMT was based on Physical Exam  After verbal consent patient was treated with HVLA, ME, FPR techniques in cervical, thoracic, lumbar and sacral areas  Patient tolerated the procedure well with improvement in symptoms  Patient given exercises, stretches and lifestyle modifications  See medications in patient instructions if given  Patient will follow up in 4-8 weeks 

## 2018-09-13 NOTE — Assessment & Plan Note (Signed)
Stable at the moment.  Discussed posture and ergonomics, we discussed different lifting techniques.  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  We will need to monitor.  Follow-up again in 4 to 8 weeks.

## 2018-10-15 ENCOUNTER — Other Ambulatory Visit: Payer: Self-pay | Admitting: Internal Medicine

## 2018-10-15 MED ORDER — VENLAFAXINE HCL ER 75 MG PO CP24: ORAL_CAPSULE | ORAL | 0 refills | Status: DC

## 2018-10-18 ENCOUNTER — Encounter: Payer: Self-pay | Admitting: Family Medicine

## 2018-10-18 ENCOUNTER — Ambulatory Visit: Payer: 59 | Admitting: Family Medicine

## 2018-10-18 ENCOUNTER — Other Ambulatory Visit: Payer: Self-pay

## 2018-10-18 VITALS — BP 110/64 | HR 109 | Ht 61.0 in

## 2018-10-18 DIAGNOSIS — M4722 Other spondylosis with radiculopathy, cervical region: Secondary | ICD-10-CM | POA: Diagnosis not present

## 2018-10-18 DIAGNOSIS — M999 Biomechanical lesion, unspecified: Secondary | ICD-10-CM

## 2018-10-18 DIAGNOSIS — M1812 Unilateral primary osteoarthritis of first carpometacarpal joint, left hand: Secondary | ICD-10-CM | POA: Diagnosis not present

## 2018-10-18 NOTE — Assessment & Plan Note (Signed)
Decision today to treat with OMT was based on Physical Exam  After verbal consent patient was treated with HVLA, ME, FPR techniques in cervical, thoracic, lumbar and sacral areas  Patient tolerated the procedure well with improvement in symptoms  Patient given exercises, stretches and lifestyle modifications  See medications in patient instructions if given  Patient will follow up in 6 weeks 

## 2018-10-18 NOTE — Assessment & Plan Note (Signed)
Known degenerative disc disease.  Patient has also arthritic changes of the back.  Patient is to continue with conservative therapy.  Has responded decently to manipulation.  Follow-up again in 4 to 8 weeks

## 2018-10-18 NOTE — Progress Notes (Signed)
[name] [place], [place] [address] Phone: [phone] Subjective:   [name], am serving as a scribe for Dr. Hulan Saas.  CC: Neck and back pain follow-up  [id number]  Tamara [name] is a 58 y.o. female coming in with complaint of back and neck pain. No change since last visit. Has been doing ok other than when she is standing on concrete. Patient since then some repetitive activity still seems to give her some discomfort.  States that Celebrex can be helpful when she takes it on a regular basis.    Past Medical History:  Diagnosis Date  . Allergic rhinitis, mild   . Anemia   . Depression   . Hypertension   . Ovarian cyst    Past Surgical History:  Procedure Laterality Date  . LAMINECTOMY    . LUMBAR EPIDURAL INJECTION  11/03/2014   Social History   Socioeconomic History  . Marital status: Married    Spouse name: Not on file  . Number of children: 0  . Years of education: Not on file  . Highest education level: Not on file  Occupational History  . Not on file  Social Needs  . Financial resource strain: Not on file  . Food insecurity    Worry: Not on file    Inability: Not on file  . Transportation needs    Medical: Not on file    Non-medical: Not on file  Tobacco Use  . Smoking status: Never Smoker  . Smokeless tobacco: Never Used  Substance and Sexual Activity  . Alcohol use: Yes    Alcohol/week: 3.0 standard drinks    Types: 3 Standard drinks or equivalent per week  . Drug use: No  . Sexual activity: Yes    Partners: Male    Birth control/protection: I.U.D.  Lifestyle  . Physical activity    Days per week: Not on file    Minutes per session: Not on file  . Stress: Not on file  Relationships  . Social Herbalist on phone: Not on file    Gets together: Not on file    Attends religious service: Not on file    Active member of club or organization: Not on file    Attends  meetings of clubs or organizations: Not on file    Relationship status: Not on file  Other Topics Concern  . Not on file  Social History Narrative  . Not on file   Allergies  Allergen Reactions  . Gabapentin Swelling   Family History  Problem Relation Age of Onset  . Depression Mother   . Depression Father   . Alcohol abuse Father      Current Outpatient Medications (Cardiovascular):  .  losartan (COZAAR) 100 MG tablet, TAKE 1 TABLET BY MOUTH EVERY DAY  Current Outpatient Medications (Respiratory):  .  fluticasone (FLONASE) 50 MCG/ACT nasal spray, Place 2 sprays into both nostrils daily.  Current Outpatient Medications (Analgesics):  .  celecoxib (CELEBREX) 200 MG capsule, One to 2 tablets by mouth daily as needed for pain. .  meloxicam (MOBIC) 15 MG tablet, Take 1 tablet (15 mg total) by mouth daily.   Current Outpatient Medications (Other):  Marland Kitchen  ALPRAZolam (XANAX) 0.5 MG tablet, TAKE 1 TABLET BY MOUTH THREE TIMES DAILY AS NEEDED FOR ANXIETY .  hydrOXYzine (ATARAX/VISTARIL) 10 MG tablet, Take 1 tablet (10 mg total) by mouth every 8 (eight) hours as needed. Marland Kitchen  venlafaxine XR (EFFEXOR-XR) 75 MG 24 hr capsule, TAKE 1 CAPSULE(75 MG) BY MOUTH DAILY WITH BREAKFAST .  Vitamin D, Ergocalciferol, (DRISDOL) 1.25 MG (50000 UT) CAPS capsule, Take 1 capsule (50,000 Units total) by mouth every 7 (seven) days.    Past medical history, social, surgical and family history all reviewed in electronic medical record.  No pertanent information unless stated regarding to the chief complaint.   Review of Systems:  No headache, visual changes, nausea, vomiting, diarrhea, constipation, dizziness, abdominal pain, skin rash, fevers, chills, night sweats, weight loss, swollen lymph nodes, body aches, joint swelling, muscle aches, chest pain, shortness of breath, mood changes.   Objective  Blood pressure 110/64, pulse (!) 109, height 5\' 1"  (1.549 m), SpO2 98 %.    General: No apparent distress  alert and oriented x3 mood and affect normal, dressed appropriately.  HEENT: Pupils equal, extraocular movements intact  Respiratory: Patient's speak in full sentences and does not appear short of breath  Cardiovascular: No lower extremity edema, non tender, no erythema  Skin: Warm dry intact with no signs of infection or rash on extremities or on axial skeleton.  Abdomen: Soft nontender  Neuro: Cranial nerves II through XII are intact, neurovascularly intact in all extremities with 2+ DTRs and 2+ pulses.  Lymph: No lymphadenopathy of posterior or anterior cervical chain or axillae bilaterally.  Gait normal with good balance and coordination.  MSK:  Non tender with full range of motion and good stability and symmetric strength and tone of shoulders, elbows, wrist, hip, knee and ankles bilaterally.  Neck: Inspection loss of lordosis. No palpable stepoffs. Negative Spurling's maneuver. Mild limitation of the rotation and side bending  Grip strength and sensation normal in bilateral hands Strength good C4 to T1 distribution No sensory change to C4 to T1 Negative Hoffman sign bilaterally Reflexes normal  Back Exam:  Inspection: Unremarkable  Motion: Flexion 45 deg, Extension 20 deg, Side Bending to 25 deg bilaterally,  Rotation to 45 deg bilaterally  SLR laying: Negative  XSLR laying: Negative  Palpable tenderness: Tender to palpation paraspinal musculature lumbar spine right greater than left. FABER: negative. Sensory change: Gross sensation intact to all lumbar and sacral dermatomes.  Reflexes: 2+ at both patellar tendons, 2+ at achilles tendons, Babinski's downgoing.  Strength at foot  Plantar-flexion: 5/5 Dorsi-flexion: 5/5 Eversion: 5/5 Inversion: 5/5  Leg strength  Quad: 5/5 Hamstring: 5/5 Hip flexor: 5/5 Hip abductors: 5/5  Gait unremarkable.  Osteopathic findings  C2 flexed rotated and side bent right C6 flexed rotated and side bent left T3 extended rotated and side bent  right inhaled third rib T9 extended rotated and side bent left L2 flexed rotated and side bent right Sacrum right on right    Impression and Recommendations:     This case required medical decision making of moderate complexity. The above documentation has been reviewed and is accurate and complete [name], DO       Note: This dictation was prepared with Dragon dictation along with smaller phrase technology. Any transcriptional errors that result from this process are unintentional.

## 2018-10-18 NOTE — Assessment & Plan Note (Signed)
Known arthritic changes, will discuss the possibility of repeating the PRP injection at a future date.  Follow-up again with me in 4 to 8 weeks

## 2018-10-23 ENCOUNTER — Encounter: Payer: Self-pay | Admitting: Family Medicine

## 2018-10-30 ENCOUNTER — Ambulatory Visit: Payer: 59 | Admitting: Family Medicine

## 2018-11-06 ENCOUNTER — Other Ambulatory Visit: Payer: Self-pay | Admitting: Internal Medicine

## 2018-11-06 MED ORDER — ALPRAZOLAM 0.5 MG PO TABS: 0.5000 mg | ORAL_TABLET | Freq: Three times a day (TID) | ORAL | 0 refills | Status: DC | PRN

## 2018-11-06 NOTE — Telephone Encounter (Signed)
Done erx   Please let pt know - will need ROV/yearly visit for further refills

## 2018-11-06 NOTE — Addendum Note (Signed)
Addended by: [name] on: 11/06/2018 01:51 PM   Modules accepted: Orders

## 2018-11-06 NOTE — Telephone Encounter (Signed)
Pt needs a refill on alprazolam. Pt has called pharm [facility] [address]

## 2018-11-07 ENCOUNTER — Other Ambulatory Visit: Payer: Self-pay | Admitting: Internal Medicine

## 2018-11-07 MED ORDER — LOSARTAN POTASSIUM 100 MG PO TABS: 100.0000 mg | ORAL_TABLET | Freq: Every day | ORAL | 1 refills | Status: DC

## 2018-11-07 NOTE — Telephone Encounter (Signed)
Medication Refill - Medication:  losartan (COZAAR) 100 MG tablet   Has the patient contacted their pharmacy?  Yes trying to send electronic request, however kept failing.   Preferred Pharmacy (with phone number or street name):  [facility] [phone] - [place], [place] - [place] [phone] (Phone) [phone] (Fax)   Agent: Please be advised that RX refills may take up to 3 business days. We ask that you follow-up with your pharmacy.

## 2018-11-12 ENCOUNTER — Other Ambulatory Visit: Payer: Self-pay

## 2018-11-12 ENCOUNTER — Encounter: Payer: Self-pay | Admitting: Internal Medicine

## 2018-11-12 ENCOUNTER — Ambulatory Visit (INDEPENDENT_AMBULATORY_CARE_PROVIDER_SITE_OTHER): Payer: 59 | Admitting: Internal Medicine

## 2018-11-12 ENCOUNTER — Other Ambulatory Visit (INDEPENDENT_AMBULATORY_CARE_PROVIDER_SITE_OTHER): Payer: 59

## 2018-11-12 VITALS — BP 130/88 | HR 106 | Temp 98.6°F | Ht 61.0 in | Wt 165.0 lb

## 2018-11-12 DIAGNOSIS — I10 Essential (primary) hypertension: Secondary | ICD-10-CM | POA: Diagnosis not present

## 2018-11-12 DIAGNOSIS — Z23 Encounter for immunization: Secondary | ICD-10-CM | POA: Diagnosis not present

## 2018-11-12 DIAGNOSIS — Z0000 Encounter for general adult medical examination without abnormal findings: Secondary | ICD-10-CM

## 2018-11-12 LAB — CBC WITH DIFFERENTIAL/PLATELET
Basophils Absolute: 0 10*3/uL (ref 0.0–0.1)
Basophils Relative: 0.5 % (ref 0.0–3.0)
Eosinophils Absolute: 0.7 10*3/uL (ref 0.0–0.7)
Eosinophils Relative: 11.6 % — ABNORMAL HIGH (ref 0.0–5.0)
HCT: 42.8 % (ref 36.0–46.0)
Hemoglobin: 14.5 g/dL (ref 12.0–15.0)
Lymphocytes Relative: 32.8 % (ref 12.0–46.0)
Lymphs Abs: 2.1 10*3/uL (ref 0.7–4.0)
MCHC: 33.7 g/dL (ref 30.0–36.0)
MCV: 97.5 fl (ref 78.0–100.0)
Monocytes Absolute: 0.7 10*3/uL (ref 0.1–1.0)
Monocytes Relative: 10.5 % (ref 3.0–12.0)
Neutro Abs: 2.8 10*3/uL (ref 1.4–7.7)
Neutrophils Relative %: 44.6 % (ref 43.0–77.0)
Platelets: 304 10*3/uL (ref 150.0–400.0)
RBC: 4.39 Mil/uL (ref 3.87–5.11)
RDW: 12.2 % (ref 11.5–15.5)
WBC: 6.3 10*3/uL (ref 4.0–10.5)

## 2018-11-12 MED ORDER — VENLAFAXINE HCL ER 75 MG PO CP24: ORAL_CAPSULE | ORAL | 3 refills | Status: DC

## 2018-11-12 MED ORDER — LOSARTAN POTASSIUM 100 MG PO TABS: 100.0000 mg | ORAL_TABLET | Freq: Every day | ORAL | 3 refills | Status: DC

## 2018-11-12 NOTE — Progress Notes (Signed)
Subjective:    Patient ID: Tamara [name], female    DOB: [birth year], 58 y.o.   MRN: [id number]  HPI  Here for wellness and f/u;  Overall doing ok;  Pt denies Chest pain, worsening SOB, DOE, wheezing, orthopnea, PND, worsening LE edema, palpitations, dizziness or syncope.  Pt denies neurological change such as new headache, facial or extremity weakness.  Pt denies polydipsia, polyuria, or low sugar symptoms. Pt states overall good compliance with treatment and medications, good tolerability, and has been trying to follow appropriate diet.  Pt denies worsening depressive symptoms, suicidal ideation or panic. No fever, night sweats, wt loss, loss of appetite, or other constitutional symptoms.  Pt states good ability with ADL's, has low fall risk, home safety reviewed and adequate, no other significant changes in hearing or vision, and only occasionally active with exercise. No new complaints Past Medical History:  Diagnosis Date  . Allergic rhinitis, mild   . Anemia   . Depression   . Hypertension   . Ovarian cyst    Past Surgical History:  Procedure Laterality Date  . LAMINECTOMY    . LUMBAR EPIDURAL INJECTION  11/03/2014    reports that she has never smoked. She has never used smokeless tobacco. She reports current alcohol use of about 3.0 standard drinks of alcohol per week. She reports that she does not use drugs. family history includes Alcohol abuse in her father; Depression in her father and mother. Allergies  Allergen Reactions  . Gabapentin Swelling   Current Outpatient Medications on File Prior to Visit  Medication Sig Dispense Refill  . ALPRAZolam (XANAX) 0.5 MG tablet Take 1 tablet (0.5 mg total) by mouth 3 (three) times daily as needed. for anxiety 90 tablet 0  . celecoxib (CELEBREX) 200 MG capsule One to 2 tablets by mouth daily as needed for pain. 60 capsule 2  . celecoxib (CELEBREX) 200 MG capsule Take 1 capsule by mouth daily.    . fluticasone (FLONASE) 50 MCG/ACT  nasal spray Place 2 sprays into both nostrils daily. 16 g 6  . hydrOXYzine (ATARAX/VISTARIL) 10 MG tablet Take 1 tablet (10 mg total) by mouth every 8 (eight) hours as needed. 90 tablet 3  . Vitamin D, Ergocalciferol, (DRISDOL) 1.25 MG (50000 UT) CAPS capsule Take 1 capsule (50,000 Units total) by mouth every 7 (seven) days. 12 capsule 0   No current facility-administered medications on file prior to visit.    Review of Systems Constitutional: Negative for other unusual diaphoresis, sweats, appetite or weight changes HENT: Negative for other worsening hearing loss, ear pain, facial swelling, mouth sores or neck stiffness.   Eyes: Negative for other worsening pain, redness or other visual disturbance.  Respiratory: Negative for other stridor or swelling Cardiovascular: Negative for other palpitations or other chest pain  Gastrointestinal: Negative for worsening diarrhea or loose stools, blood in stool, distention or other pain Genitourinary: Negative for hematuria, flank pain or other change in urine volume.  Musculoskeletal: Negative for myalgias or other joint swelling.  Skin: Negative for other color change, or other wound or worsening drainage.  Neurological: Negative for other syncope or numbness. Hematological: Negative for other adenopathy or swelling Psychiatric/Behavioral: Negative for hallucinations, other worsening agitation, SI, self-injury, or new decreased concentration All otherwise neg per pt    Objective:   Physical Exam BP 130/88 (BP Location: Left Arm, Patient Position: Sitting, Cuff Size: Normal)   Pulse (!) 106   Temp 98.6 F (37 C) (Oral)   Ht 5\' 1"  (  1.549 m)   Wt 165 lb (74.8 kg)   SpO2 98%   BMI 31.18 kg/m  VS noted,  Constitutional: Pt is oriented to person, place, and time. Appears well-developed and well-nourished, in no significant distress and comfortable Head: Normocephalic and atraumatic  Eyes: Conjunctivae and EOM are normal. Pupils are equal, round,  and reactive to light Right Ear: External ear normal without discharge Left Ear: External ear normal without discharge Nose: Nose without discharge or deformity Mouth/Throat: Oropharynx is without other ulcerations and moist  Neck: Normal range of motion. Neck supple. No JVD present. No tracheal deviation present or significant neck LA or mass Cardiovascular: Normal rate, regular rhythm, normal heart sounds and intact distal pulses.   Pulmonary/Chest: WOB normal and breath sounds without rales or wheezing  Abdominal: Soft. Bowel sounds are normal. NT. No HSM  Musculoskeletal: Normal range of motion. Exhibits no edema Lymphadenopathy: Has no other cervical adenopathy.  Neurological: Pt is alert and oriented to person, place, and time. Pt has normal reflexes. No cranial nerve deficit. Motor grossly intact, Gait intact Skin: Skin is warm and dry. No rash noted or new ulcerations Psychiatric:  Has normal mood and affect. Behavior is normal without agitation All otherwise neg per pt  Lab Results  Component Value Date   WBC 4.7 11/10/2017   HGB 14.1 11/10/2017   HCT 40.9 11/10/2017   PLT 285.0 11/10/2017   GLUCOSE 85 11/10/2017   CHOL 182 11/10/2017   TRIG 83.0 11/10/2017   HDL 86.70 11/10/2017   LDLCALC 79 11/10/2017   ALT 20 11/10/2017   AST 24 11/10/2017   NA 141 11/10/2017   K 4.3 11/10/2017   CL 105 11/10/2017   CREATININE 0.74 11/10/2017   BUN 8 11/10/2017   CO2 29 11/10/2017   TSH 2.24 11/10/2017         Assessment & Plan:

## 2018-11-12 NOTE — Patient Instructions (Signed)

## 2018-11-12 NOTE — Addendum Note (Signed)
Addended by: Terence Lux B on: 11/12/2018 04:45 PM   Modules accepted: Orders

## 2018-11-12 NOTE — Assessment & Plan Note (Signed)

## 2018-11-12 NOTE — Assessment & Plan Note (Signed)
stable overall by history and exam, recent data reviewed with pt, and pt to continue medical treatment as before,  to f/u any worsening symptoms or concerns  

## 2018-11-13 ENCOUNTER — Encounter: Payer: Self-pay | Admitting: Internal Medicine

## 2018-11-13 LAB — URINALYSIS, ROUTINE W REFLEX MICROSCOPIC
Bilirubin Urine: NEGATIVE
Hgb urine dipstick: NEGATIVE
Ketones, ur: NEGATIVE
Leukocytes,Ua: NEGATIVE
Nitrite: NEGATIVE
RBC / HPF: NONE SEEN (ref 0–?)
Specific Gravity, Urine: 1.025 (ref 1.000–1.030)
Total Protein, Urine: NEGATIVE
Urine Glucose: NEGATIVE
Urobilinogen, UA: 0.2 (ref 0.0–1.0)
pH: 6 (ref 5.0–8.0)

## 2018-11-13 LAB — HEPATIC FUNCTION PANEL
ALT: 17 U/L (ref 0–35)
AST: 20 U/L (ref 0–37)
Albumin: 4.4 g/dL (ref 3.5–5.2)
Alkaline Phosphatase: 83 U/L (ref 39–117)
Bilirubin, Direct: 0.1 mg/dL (ref 0.0–0.3)
Total Bilirubin: 0.6 mg/dL (ref 0.2–1.2)
Total Protein: 7.1 g/dL (ref 6.0–8.3)

## 2018-11-13 LAB — LIPID PANEL
Cholesterol: 188 mg/dL (ref 0–200)
HDL: 92 mg/dL (ref >39.00)
LDL Cholesterol: 66 mg/dL (ref 0–99)
NonHDL: 95.69
Total CHOL/HDL Ratio: 2
Triglycerides: 147 mg/dL (ref 0.0–149.0)
VLDL: 29.4 mg/dL (ref 0.0–40.0)

## 2018-11-13 LAB — BASIC METABOLIC PANEL
BUN: 16 mg/dL (ref 6–23)
CO2: 27 mEq/L (ref 19–32)
Calcium: 9.6 mg/dL (ref 8.4–10.5)
Chloride: 104 mEq/L (ref 96–112)
Creatinine, Ser: 0.83 mg/dL (ref 0.40–1.20)
GFR: 71.14 mL/min (ref >60.00)
Glucose, Bld: 97 mg/dL (ref 70–99)
Potassium: 4.2 mEq/L (ref 3.5–5.1)
Sodium: 140 mEq/L (ref 135–145)

## 2018-11-13 LAB — TSH: TSH: 4.11 u[IU]/mL (ref 0.35–4.50)

## 2018-11-19 ENCOUNTER — Encounter: Payer: Self-pay | Admitting: Family Medicine

## 2018-11-19 NOTE — Progress Notes (Signed)
[name] [address], [place] [address] Phone: [phone] Subjective:    I'm seeing this patient by the request  of:    CC: back pain   [id number]    10/18/18: Known degenerative disc disease.  Patient has also arthritic changes of the back.  Patient is to continue with conservative therapy.  Has responded decently to manipulation.  Follow-up again in 4 to 8 weeks  Update- 11/20/18: Tamara [name] is a 58 y.o. female coming in with complaint of low back pain and L wrist pain.  Pt notes that her LBP is better but her L wrist is worse.  She denies any new injury or increase in activity.  Gripping and buttoning activity seem to be the most aggravating activities for her L wrist.  Pt states that she con't to use Celebrex.     Past Medical History:  Diagnosis Date  . Allergic rhinitis, mild   . Anemia   . Depression   . Hypertension   . Ovarian cyst    Past Surgical History:  Procedure Laterality Date  . LAMINECTOMY    . LUMBAR EPIDURAL INJECTION  11/03/2014   Social History   Socioeconomic History  . Marital status: Married    Spouse name: Not on file  . Number of children: 0  . Years of education: Not on file  . Highest education level: Not on file  Occupational History  . Not on file  Social Needs  . Financial resource strain: Not on file  . Food insecurity    Worry: Not on file    Inability: Not on file  . Transportation needs    Medical: Not on file    Non-medical: Not on file  Tobacco Use  . Smoking status: Never Smoker  . Smokeless tobacco: Never Used  Substance and Sexual Activity  . Alcohol use: Yes    Alcohol/week: 3.0 standard drinks    Types: 3 Standard drinks or equivalent per week  . Drug use: No  . Sexual activity: Yes    Partners: Male    Birth control/protection: I.U.D.  Lifestyle  . Physical activity    Days per week: Not on file    Minutes per session: Not on file  . Stress: Not on file   Relationships  . Social Herbalist on phone: Not on file    Gets together: Not on file    Attends religious service: Not on file    Active member of club or organization: Not on file    Attends meetings of clubs or organizations: Not on file    Relationship status: Not on file  Other Topics Concern  . Not on file  Social History Narrative  . Not on file   Allergies  Allergen Reactions  . Gabapentin Swelling   Family History  Problem Relation Age of Onset  . Depression Mother   . Depression Father   . Alcohol abuse Father     Current Outpatient Medications (Endocrine & Metabolic):  .  levothyroxine (SYNTHROID) 50 MCG tablet, Take 1 tablet (50 mcg total) by mouth daily.  Current Outpatient Medications (Cardiovascular):  .  losartan (COZAAR) 100 MG tablet, Take 1 tablet (100 mg total) by mouth daily.  Current Outpatient Medications (Respiratory):  .  fluticasone (FLONASE) 50 MCG/ACT nasal spray, Place 2 sprays into both nostrils daily. (Patient not taking: Reported on 11/20/2018)  Current Outpatient Medications (Analgesics):  .  celecoxib (CELEBREX) 200  MG capsule, One to 2 tablets by mouth daily as needed for pain. .  celecoxib (CELEBREX) 200 MG capsule, Take 1 capsule by mouth daily.   Current Outpatient Medications (Other):  Marland Kitchen  ALPRAZolam (XANAX) 0.5 MG tablet, Take 1 tablet (0.5 mg total) by mouth 3 (three) times daily as needed. for anxiety .  hydrOXYzine (ATARAX/VISTARIL) 10 MG tablet, Take 1 tablet (10 mg total) by mouth every 8 (eight) hours as needed. .  venlafaxine XR (EFFEXOR-XR) 75 MG 24 hr capsule, TAKE 1 CAPSULE(75 MG) BY MOUTH DAILY WITH BREAKFAST .  Vitamin D, Ergocalciferol, (DRISDOL) 1.25 MG (50000 UT) CAPS capsule, Take 1 capsule (50,000 Units total) by mouth every 7 (seven) days. (Patient not taking: Reported on 11/20/2018)    Past medical history, social, surgical and family history all reviewed in electronic medical record.  No pertanent  information unless stated regarding to the chief complaint.   Review of Systems:  No headache, visual changes, nausea, vomiting, diarrhea, constipation, dizziness, abdominal pain, skin rash, fevers, chills, night sweats, weight loss, swollen lymph nodes, body aches, joint swelling, chest pain, shortness of breath, mood changes.  Positive muscle aches  Objective  Blood pressure 112/76, pulse (!) 107, height 5\' 1"  (1.549 m), weight 168 lb 6.4 oz (76.4 kg), SpO2 98 %.    General: No apparent distress alert and oriented x3 mood and affect normal, dressed appropriately.  HEENT: Pupils equal, extraocular movements intact  Respiratory: Patient's speak in full sentences and does not appear short of breath  Cardiovascular: No lower extremity edema, non tender, no erythema  Skin: Warm dry intact with no signs of infection or rash on extremities or on axial skeleton.  Abdomen: Soft nontender  Neuro: Cranial nerves II through XII are intact, neurovascularly intact in all extremities with 2+ DTRs and 2+ pulses.  Lymph: No lymphadenopathy of posterior or anterior cervical chain or axillae bilaterally.  Gait normal with good balance and coordination.  MSK:  Non tender with full range of motion and good stability and symmetric strength and tone of shoulders, elbows, wrist, hip, knee and ankles bilaterally. Left hand exam shows significant arthritic changes of the [facility] joint with positive grind test.  Mild weakness in grip strength compared to the contralateral side. Neck: Inspection loss of lordosis. No palpable stepoffs. Mild limited range of motion in all planes of 5 to 10 degrees Mild crepitus with range of motion Grip strength and sensation normal in bilateral hands Strength good C4 to T1 distribution No sensory change to C4 to T1 Negative Hoffman sign bilaterally Reflexes normal  Back Exam:  Inspection: Unremarkable  Motion: Flexion 40 deg, Extension 25 deg, Side Bending to 35 deg bilaterally,   Rotation to 45 deg bilaterally  SLR laying: Negative  XSLR laying: Negative  Palpable tenderness: Diffuse tenderness in the paraspinal musculature of the lumbar spine.  No CVA tenderness though noted.Marland Kitchen FABER: Tightness bilaterally. Sensory change: Gross sensation intact to all lumbar and sacral dermatomes.  Reflexes: 2+ at both patellar tendons, 2+ at achilles tendons, Babinski's downgoing.  Strength at foot  Plantar-flexion: 5/5 Dorsi-flexion: 5/5 Eversion: 5/5 Inversion: 5/5  Leg strength  Quad: 5/5 Hamstring: 5/5 Hip flexor: 5/5 Hip abductors: 5/5  Gait unremarkable.  Osteopathic findings C2 flexed rotated and side bent right C4 flexed rotated and side bent left  T7 extended rotated and side bent left L2 flexed rotated and side bent right Sacrum right on right     Impression and Recommendations:     This case  required medical decision making of moderate complexity. The above documentation has been reviewed and is accurate and complete [name], DO       Note: This dictation was prepared with Dragon dictation along with smaller phrase technology. Any transcriptional errors that result from this process are unintentional.

## 2018-11-20 ENCOUNTER — Encounter: Payer: Self-pay | Admitting: Family Medicine

## 2018-11-20 ENCOUNTER — Ambulatory Visit: Payer: 59 | Admitting: Family Medicine

## 2018-11-20 ENCOUNTER — Other Ambulatory Visit: Payer: Self-pay

## 2018-11-20 DIAGNOSIS — M48061 Spinal stenosis, lumbar region without neurogenic claudication: Secondary | ICD-10-CM

## 2018-11-20 DIAGNOSIS — M999 Biomechanical lesion, unspecified: Secondary | ICD-10-CM

## 2018-11-20 MED ORDER — LEVOTHYROXINE SODIUM 50 MCG PO TABS: 50.0000 ug | ORAL_TABLET | Freq: Every day | ORAL | 3 refills | Status: DC

## 2018-11-20 NOTE — Patient Instructions (Signed)
Synthroid 50 mcg daily  Re-check labs in 8 weeks.  See me again in 6 weeks.  Go vote!

## 2018-11-21 ENCOUNTER — Encounter: Payer: Self-pay | Admitting: Family Medicine

## 2018-11-21 NOTE — Assessment & Plan Note (Signed)
Known stenosis of the lower back.  Responding fairly well to osteopathic manipulation.  Does not want anything more invasive at this point including epidurals or nerve root injections.  Patient will increase activity slowly.  Patient will follow-up with me again in 4 to 8 weeks.

## 2018-11-21 NOTE — Assessment & Plan Note (Signed)
Decision today to treat with OMT was based on Physical Exam  After verbal consent patient was treated with HVLA, ME, FPR techniques in cervical, thoracic, lumbar and sacral areas  Patient tolerated the procedure well with improvement in symptoms  Patient given exercises, stretches and lifestyle modifications  See medications in patient instructions if given  Patient will follow up in 4-8 weeks 

## 2018-11-29 ENCOUNTER — Other Ambulatory Visit: Payer: Self-pay

## 2018-11-29 ENCOUNTER — Encounter: Payer: Self-pay | Admitting: Family Medicine

## 2018-11-29 ENCOUNTER — Other Ambulatory Visit: Payer: Self-pay | Admitting: Family Medicine

## 2018-11-29 MED ORDER — VITAMIN D (ERGOCALCIFEROL) 1.25 MG (50000 UNIT) PO CAPS: 50000.0000 [IU] | ORAL_CAPSULE | ORAL | 0 refills | Status: DC

## 2018-12-10 ENCOUNTER — Encounter: Payer: Self-pay | Admitting: Obstetrics & Gynecology

## 2018-12-10 ENCOUNTER — Ambulatory Visit (INDEPENDENT_AMBULATORY_CARE_PROVIDER_SITE_OTHER): Payer: 59 | Admitting: Obstetrics & Gynecology

## 2018-12-10 ENCOUNTER — Other Ambulatory Visit: Payer: Self-pay

## 2018-12-10 VITALS — BP 128/83 | HR 91 | Ht 61.0 in | Wt 165.0 lb

## 2018-12-10 DIAGNOSIS — Z124 Encounter for screening for malignant neoplasm of cervix: Secondary | ICD-10-CM | POA: Diagnosis not present

## 2018-12-10 DIAGNOSIS — Z01419 Encounter for gynecological examination (general) (routine) without abnormal findings: Secondary | ICD-10-CM | POA: Diagnosis not present

## 2018-12-10 DIAGNOSIS — N951 Menopausal and female climacteric states: Secondary | ICD-10-CM

## 2018-12-10 DIAGNOSIS — Z1151 Encounter for screening for human papillomavirus (HPV): Secondary | ICD-10-CM

## 2018-12-10 DIAGNOSIS — N941 Unspecified dyspareunia: Secondary | ICD-10-CM

## 2018-12-10 NOTE — Progress Notes (Signed)
Subjective:    Tamara [name] is a 58 y.o. married P0 who presents for an annual exam. TPain with sex is no better, didn't try dilators. The patient is sexually active. GYN screening history: last pap: was normal. The patient wears seatbelts: yes. The patient participates in regular exercise: no. Has the patient ever been transfused or tattooed?: no. The patient reports that there is not domestic violence in her life.   Menstrual History: OB History    Gravida  1   Para      Term      Preterm      AB  1   Living        SAB  1   TAB      Ectopic      Multiple      Live Births              Menarche age: 61 No LMP recorded. (Menstrual status: Irregular Periods).    The following portions of the patient's history were reviewed and updated as appropriate: allergies, current medications, past family history, past medical history, past social history, past surgical history and problem list.  Review of Systems Pertinent items are noted in HPI.   FH- no breast/gyn/colon cancer S/p colonoscopy and UTD Married since 1994  Objective:    BP 128/83   Pulse 91   Ht 5\' 1"  (1.549 m)   Wt 165 lb (74.8 kg)   BMI 31.18 kg/m   General Appearance:    Alert, cooperative, no distress, appears stated age  Head:    Normocephalic, without obvious abnormality, atraumatic  Eyes:    PERRL, conjunctiva/corneas clear, EOM's intact, fundi    benign, both eyes  Ears:    Normal TM's and external ear canals, both ears  Nose:   Nares normal, septum midline, mucosa normal, no drainage    or sinus tenderness  Throat:   Lips, mucosa, and tongue normal; teeth and gums normal  Neck:   Supple, symmetrical, trachea midline, no adenopathy;    thyroid:  no enlargement/tenderness/nodules; no carotid   bruit or JVD  Back:     Symmetric, no curvature, ROM normal, no CVA tenderness  Lungs:     Clear to auscultation bilaterally, respirations unlabored  Chest Wall:    No tenderness or deformity   Heart:    Regular rate and rhythm, S1 and S2 normal, no murmur, rub   or gallop  Breast Exam:    No tenderness, masses, or nipple abnormality  Abdomen:     Soft, non-tender, bowel sounds active all four quadrants,    no masses, no organomegaly  Genitalia:    Normal female without lesion, discharge or tenderness     Extremities:   Extremities normal, atraumatic, no cyanosis or edema  Pulses:   2+ and symmetric all extremities  Skin:   Skin color, texture, turgor normal, no rashes or lesions  Lymph nodes:   Cervical, supraclavicular, and axillary nodes normal  Neurologic:   CNII-XII intact, normal strength, sensation and reflexes    throughout  .    Assessment:    Healthy female exam.   dyspareunia   Plan:     Mammogram. Thin prep Pap smear. with cotesting amb ref pelvic PT

## 2018-12-11 LAB — FOLLICLE STIMULATING HORMONE: FSH: 83.2 m[IU]/mL

## 2018-12-12 ENCOUNTER — Telehealth: Payer: Self-pay | Admitting: Internal Medicine

## 2018-12-12 MED ORDER — ALPRAZOLAM 0.5 MG PO TABS: 0.5000 mg | ORAL_TABLET | Freq: Three times a day (TID) | ORAL | 5 refills | Status: DC | PRN

## 2018-12-12 NOTE — Telephone Encounter (Signed)
Done erx 

## 2018-12-13 LAB — CYTOLOGY - PAP
Comment: NEGATIVE
Diagnosis: NEGATIVE
High risk HPV: NEGATIVE

## 2018-12-24 ENCOUNTER — Telehealth: Payer: Self-pay | Admitting: Radiology

## 2018-12-24 NOTE — Telephone Encounter (Signed)
Left message to call [id number] to schedule appointment with Dr Hulan Fray for IUD removal

## 2019-01-02 ENCOUNTER — Ambulatory Visit: Payer: 59 | Admitting: Family Medicine

## 2019-01-02 ENCOUNTER — Other Ambulatory Visit: Payer: Self-pay

## 2019-01-02 ENCOUNTER — Encounter: Payer: Self-pay | Admitting: Family Medicine

## 2019-01-02 VITALS — BP 110/82 | HR 106 | Ht 61.0 in | Wt 168.0 lb

## 2019-01-02 DIAGNOSIS — M48061 Spinal stenosis, lumbar region without neurogenic claudication: Secondary | ICD-10-CM

## 2019-01-02 DIAGNOSIS — M180 Bilateral primary osteoarthritis of first carpometacarpal joints: Secondary | ICD-10-CM | POA: Diagnosis not present

## 2019-01-02 DIAGNOSIS — M999 Biomechanical lesion, unspecified: Secondary | ICD-10-CM | POA: Diagnosis not present

## 2019-01-02 MED ORDER — VITAMIN D (ERGOCALCIFEROL) 1.25 MG (50000 UNIT) PO CAPS: 50000.0000 [IU] | ORAL_CAPSULE | ORAL | 0 refills | Status: DC

## 2019-01-02 NOTE — Progress Notes (Signed)
[name] [address], [place] [address] Phone: [phone] Subjective:     I'm seeing this patient by the request  of:    CC: Low back pain follow-up, thumb pain  [id number]   11/20/2018 Known stenosis of the lower back.  Responding fairly well to osteopathic manipulation.  Does not want anything more invasive at this point including epidurals or nerve root injections.  Patient will increase activity slowly.  Patient will follow-up with me again in 4 to 8 weeks.  Update 01/02/2019 Tamara [name] is a 58 y.o. female coming in with complaint of back pain and left CMC joinjt. Her back pain is doing better but the thumb pain continues. Has been using Celebrex at night. Pain wakes her up at night. Is wearing splint at night. Continues to have pain throughout the day as well.  Patient is failed all conservative therapy when it comes to her [facility] joint with steroid injections, bracing, even PRP with no significant improvement    Past Medical History:  Diagnosis Date  . Allergic rhinitis, mild   . Anemia   . Depression   . Hypertension   . Ovarian cyst    Past Surgical History:  Procedure Laterality Date  . LAMINECTOMY    . LUMBAR EPIDURAL INJECTION  11/03/2014   Social History   Socioeconomic History  . Marital status: Married    Spouse name: Not on file  . Number of children: 0  . Years of education: Not on file  . Highest education level: Not on file  Occupational History  . Not on file  Social Needs  . Financial resource strain: Not on file  . Food insecurity    Worry: Not on file    Inability: Not on file  . Transportation needs    Medical: Not on file    Non-medical: Not on file  Tobacco Use  . Smoking status: Never Smoker  . Smokeless tobacco: Never Used  Substance and Sexual Activity  . Alcohol use: Yes    Alcohol/week: 3.0 standard drinks    Types: 3 Standard drinks or equivalent per week  . Drug use: No  .  Sexual activity: Yes    Partners: Male    Birth control/protection: I.U.D.  Lifestyle  . Physical activity    Days per week: Not on file    Minutes per session: Not on file  . Stress: Not on file  Relationships  . Social Herbalist on phone: Not on file    Gets together: Not on file    Attends religious service: Not on file    Active member of club or organization: Not on file    Attends meetings of clubs or organizations: Not on file    Relationship status: Not on file  Other Topics Concern  . Not on file  Social History Narrative  . Not on file   Allergies  Allergen Reactions  . Gabapentin Swelling   Family History  Problem Relation Age of Onset  . Depression Mother   . Depression Father   . Alcohol abuse Father     Current Outpatient Medications (Endocrine & Metabolic):  .  levothyroxine (SYNTHROID) 50 MCG tablet, Take 1 tablet (50 mcg total) by mouth daily.  Current Outpatient Medications (Cardiovascular):  .  losartan (COZAAR) 100 MG tablet, Take 1 tablet (100 mg total) by mouth daily.  Current Outpatient Medications (Respiratory):  .  fluticasone (FLONASE) 50 MCG/ACT nasal  spray, Place 2 sprays into both nostrils daily.  Current Outpatient Medications (Analgesics):  .  celecoxib (CELEBREX) 200 MG capsule, One to 2 tablets by mouth daily as needed for pain. .  celecoxib (CELEBREX) 200 MG capsule, Take 1 capsule by mouth daily.   Current Outpatient Medications (Other):  Marland Kitchen  ALPRAZolam (XANAX) 0.5 MG tablet, Take 1 tablet (0.5 mg total) by mouth 3 (three) times daily as needed. for anxiety .  hydrOXYzine (ATARAX/VISTARIL) 10 MG tablet, Take 1 tablet (10 mg total) by mouth every 8 (eight) hours as needed. .  venlafaxine XR (EFFEXOR-XR) 75 MG 24 hr capsule, TAKE 1 CAPSULE(75 MG) BY MOUTH DAILY WITH BREAKFAST .  Vitamin D, Ergocalciferol, (DRISDOL) 1.25 MG (50000 UT) CAPS capsule, Take 1 capsule (50,000 Units total) by mouth every 7 (seven) days. .  Vitamin  D, Ergocalciferol, (DRISDOL) 1.25 MG (50000 UT) CAPS capsule, Take 1 capsule (50,000 Units total) by mouth every 7 (seven) days.    Past medical history, social, surgical and family history all reviewed in electronic medical record.  No pertanent information unless stated regarding to the chief complaint.   Review of Systems:  No headache, visual changes, nausea, vomiting, diarrhea, constipation, dizziness, abdominal pain, skin rash, fevers, chills, night sweats, weight loss, swollen lymph nodes, body aches, joint swelling, , chest pain, shortness of breath, mood changes.  Positive muscle aches  Objective  Blood pressure 110/82, pulse (!) 106, height 5\' 1"  (1.549 m), weight 168 lb (76.2 kg), SpO2 97 %.    General: No apparent distress alert and oriented x3 mood and affect normal, dressed appropriately.  HEENT: Pupils equal, extraocular movements intact  Respiratory: Patient's speak in full sentences and does not appear short of breath  Cardiovascular: No lower extremity edema, non tender, no erythema  Skin: Warm dry intact with no signs of infection or rash on extremities or on axial skeleton.  Abdomen: Soft nontender  Neuro: Cranial nerves II through XII are intact, neurovascularly intact in all extremities with 2+ DTRs and 2+ pulses.  Lymph: No lymphadenopathy of posterior or anterior cervical chain or axillae bilaterally.  Gait normal with good balance and coordination.  MSK:  Non tender with full range of motion and good stability and symmetric strength and tone of shoulders, elbows,  hip, knee and ankles bilaterally.  Left CMC joint does have unfortunately arthritic changes noted and atrophy of the thenar eminence noted.  Severely positive grind test.  5 out of 5 strength though still noted.  Back exam does have some loss of lordosis with very mild degenerative scoliosis noted which is somewhat new from previous exam.  Patient does have tightness in the paraspinous musculature right  greater than left.  Positive Faber right.  Osteopathic findings  C2 flexed rotated and side bent right C6 flexed rotated and side bent left T3 extended rotated and side bent right inhaled third rib L2 flexed rotated and side bent right Sacrum right on right    Impression and Recommendations:     This case required medical decision making of moderate complexity. The above documentation has been reviewed and is accurate and complete [name], DO       Note: This dictation was prepared with Dragon dictation along with smaller phrase technology. Any transcriptional errors that result from this process are unintentional.

## 2019-01-02 NOTE — Patient Instructions (Signed)
Once weekly Vitamin D

## 2019-01-03 NOTE — Assessment & Plan Note (Signed)
Referred to discuss surgical intervention affecting all daily activities at this moment.

## 2019-01-03 NOTE — Assessment & Plan Note (Signed)
Decision today to treat with OMT was based on Physical Exam  After verbal consent patient was treated with HVLA, ME, FPR techniques in cervical, thoracic, lumbar and sacral areas  Patient tolerated the procedure well with improvement in symptoms  Patient given exercises, stretches and lifestyle modifications  See medications in patient instructions if given  Patient will follow up in 4-6 weeks 

## 2019-01-03 NOTE — Assessment & Plan Note (Signed)
Known spinal stenosis.  Discussed with patient about posture and ergonomics, discussed which activities to do which wants to avoid.  Patient is to increase activity slowly.  Encourage core strengthening.  Follow-up again in 4 to 8 weeks responding to osteopathic manipulation.

## 2019-01-09 ENCOUNTER — Encounter: Payer: Self-pay | Admitting: Family Medicine

## 2019-01-14 ENCOUNTER — Other Ambulatory Visit: Payer: Self-pay | Admitting: *Deleted

## 2019-01-14 ENCOUNTER — Encounter: Payer: Self-pay | Admitting: Internal Medicine

## 2019-01-14 MED ORDER — VENLAFAXINE HCL ER 75 MG PO CP24: ORAL_CAPSULE | ORAL | 3 refills | Status: DC

## 2019-01-24 ENCOUNTER — Encounter: Payer: Self-pay | Admitting: Family Medicine

## 2019-01-24 ENCOUNTER — Telehealth: Payer: Self-pay

## 2019-01-24 NOTE — Telephone Encounter (Signed)
Rescheduled patient appointment

## 2019-01-30 ENCOUNTER — Other Ambulatory Visit: Payer: Self-pay

## 2019-01-30 ENCOUNTER — Ambulatory Visit
Admission: RE | Admit: 2019-01-30 | Discharge: 2019-01-30 | Disposition: A | Discharge status: Home / Self Care | Payer: 59 | Source: Ambulatory Visit | Attending: Obstetrics & Gynecology | Admitting: Obstetrics & Gynecology

## 2019-01-30 DIAGNOSIS — Z01419 Encounter for gynecological examination (general) (routine) without abnormal findings: Secondary | ICD-10-CM

## 2019-02-01 ENCOUNTER — Encounter: Payer: Self-pay | Admitting: Family Medicine

## 2019-02-06 ENCOUNTER — Ambulatory Visit: Payer: 59 | Attending: Internal Medicine

## 2019-02-06 DIAGNOSIS — Z20822 Contact with and (suspected) exposure to covid-19: Secondary | ICD-10-CM

## 2019-02-08 LAB — NOVEL CORONAVIRUS, NAA: SARS-CoV-2, NAA: NOT DETECTED

## 2019-02-12 ENCOUNTER — Ambulatory Visit: Payer: 59 | Admitting: Obstetrics & Gynecology

## 2019-02-13 ENCOUNTER — Ambulatory Visit: Payer: 59 | Admitting: Family Medicine

## 2019-02-19 ENCOUNTER — Encounter: Payer: Self-pay | Admitting: Obstetrics & Gynecology

## 2019-02-19 ENCOUNTER — Ambulatory Visit (INDEPENDENT_AMBULATORY_CARE_PROVIDER_SITE_OTHER): Payer: 59 | Admitting: Obstetrics & Gynecology

## 2019-02-19 ENCOUNTER — Other Ambulatory Visit: Payer: Self-pay

## 2019-02-19 VITALS — BP 128/91 | HR 92 | Wt 167.0 lb

## 2019-02-19 DIAGNOSIS — Z30432 Encounter for removal of intrauterine contraceptive device: Secondary | ICD-10-CM

## 2019-02-19 DIAGNOSIS — T8339XA Other mechanical complication of intrauterine contraceptive device, initial encounter: Secondary | ICD-10-CM | POA: Diagnosis not present

## 2019-02-19 MED ORDER — MISOPROSTOL 200 MCG PO TABS: ORAL_TABLET | ORAL | 0 refills | Status: DC

## 2019-02-19 NOTE — Progress Notes (Signed)
   Subjective:    Patient ID: Tamara [name], female    DOB: [birth year], 59 y.o.   MRN: [id number]  HPI 59 yo married P0 here for removal of IUD. Her Fairlea recently was 73.   Review of Systems Pap and mammogram both normal in 2020.    Objective:   Physical Exam Breathing, conversing, and ambulating normally Well nourished, well hydrated White female, no apparent distress Strings not visible. I sprayed Hurricaine spray and grasped the anterior lip of cervix with tenaculum I used dilators but was still unable to place the IUD hook inside of the uterus.   Assessment & Plan:  Retained IUD with no visible strings She prefers to have it removed in the OR versus the office I will prescribe cytotec for treatment the night prior An [place] will be done to confirm the placement of the IUD

## 2019-02-27 ENCOUNTER — Ambulatory Visit: Payer: 59 | Admitting: Family Medicine

## 2019-02-27 ENCOUNTER — Encounter: Payer: Self-pay | Admitting: Family Medicine

## 2019-02-27 ENCOUNTER — Ambulatory Visit (HOSPITAL_COMMUNITY)
Admission: RE | Admit: 2019-02-27 | Discharge: 2019-02-27 | Disposition: A | Discharge status: Home / Self Care | Payer: 59 | Source: Ambulatory Visit | Attending: Obstetrics & Gynecology | Admitting: Obstetrics & Gynecology

## 2019-02-27 ENCOUNTER — Ambulatory Visit (INDEPENDENT_AMBULATORY_CARE_PROVIDER_SITE_OTHER): Payer: 59

## 2019-02-27 ENCOUNTER — Other Ambulatory Visit: Payer: Self-pay

## 2019-02-27 VITALS — BP 102/78 | HR 103 | Ht 61.0 in | Wt 160.0 lb

## 2019-02-27 DIAGNOSIS — M999 Biomechanical lesion, unspecified: Secondary | ICD-10-CM | POA: Diagnosis not present

## 2019-02-27 DIAGNOSIS — M79644 Pain in right finger(s): Secondary | ICD-10-CM | POA: Diagnosis not present

## 2019-02-27 DIAGNOSIS — T8339XA Other mechanical complication of intrauterine contraceptive device, initial encounter: Secondary | ICD-10-CM | POA: Diagnosis present

## 2019-02-27 DIAGNOSIS — M19011 Primary osteoarthritis, right shoulder: Secondary | ICD-10-CM | POA: Diagnosis not present

## 2019-02-27 DIAGNOSIS — M48061 Spinal stenosis, lumbar region without neurogenic claudication: Secondary | ICD-10-CM | POA: Diagnosis not present

## 2019-02-27 NOTE — Progress Notes (Signed)
Tamara Tamara [name] Phone: [phone] Subjective:   Tamara Tamara [name], am serving as a scribe for Tamara Tamara [name]. This visit occurred during the SARS-CoV-2 public health emergency.  Safety protocols were in place, including screening questions prior to the visit, additional usage of staff PPE, and extensive cleaning of exam room while observing appropriate contact time as indicated for disinfecting solutions.    CC: Low back pain follow-up, neck pain, shoulder pain, thumb pain  [id number]   11/18/21020 Referred to discuss surgical intervention affecting all daily activities at this moment.  Known spinal stenosis.  Discussed with patient about posture and ergonomics, discussed which activities to do which wants to avoid.  Patient is to increase activity slowly.  Encourage core strengthening.  Follow-up again in 4 to 8 weeks responding to osteopathic manipulation.   Update 02/27/2019 Tamara Tamara [name] is a 59 y.o. female coming in with complaint of back and thumb pain. Patient states that her back pain is not at bad as is used to be. OMT has helped to manage her pain.   Thumb pain continues. Is going to meet with Tamara Tamara [name] next week to discuss pain. Also feels as if she is having pain at the base of her right thumb now.  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  Following up with a hand surgeon in the near future  Is also having right shoulder pain. Feels decrease ROM in flexion and IR.   Has discontinued celebrex and went back using meloxicam.     Past Medical History:  Diagnosis Date  . Allergic rhinitis, mild   . Anemia   . Depression   . Hypertension   . Ovarian cyst    Past Surgical History:  Procedure Laterality Date  . LAMINECTOMY    . LUMBAR EPIDURAL INJECTION  11/03/2014   Social History   Socioeconomic History  . Marital status: Married    Spouse name: Not on file  .  Number of children: 0  . Years of education: Not on file  . Highest education level: Not on file  Occupational History  . Not on file  Tobacco Use  . Smoking status: Never Smoker  . Smokeless tobacco: Never Used  Substance and Sexual Activity  . Alcohol use: Yes    Alcohol/week: 3.0 standard drinks    Types: 3 Standard drinks or equivalent per week  . Drug use: Tamara [name]  . Sexual activity: Yes    Partners: Male    Birth control/protection: I.U.D.  Other Topics Concern  . Not on file  Social History Narrative  . Not on file   Social Determinants of Health   Financial Resource Strain:   . Difficulty of Paying Living Expenses: Not on file  Food Insecurity:   . Worried About Charity fundraiser in the Last Year: Not on file  . Ran Out of Food in the Last Year: Not on file  Transportation Needs:   . Lack of Transportation (Medical): Not on file  . Lack of Transportation (Non-Medical): Not on file  Physical Activity:   . Days of Exercise per Week: Not on file  . Minutes of Exercise per Session: Not on file  Stress:   . Feeling of Stress : Not on file  Social Connections:   . Frequency of Communication with Friends and Family: Not on file  . Frequency of Social Gatherings with Friends and Family: Not on file  .  Attends Religious Services: Not on file  . Active Member of Clubs or Organizations: Not on file  . Attends Archivist Meetings: Not on file  . Marital Status: Not on file   Allergies  Allergen Reactions  . Gabapentin Swelling   Family History  Problem Relation Age of Onset  . Depression Mother   . Depression Father   . Alcohol abuse Father     Current Outpatient Medications (Endocrine & Metabolic):  .  levothyroxine (SYNTHROID) 50 MCG tablet, Take 1 tablet (50 mcg total) by mouth daily.  Current Outpatient Medications (Cardiovascular):  .  losartan (COZAAR) 100 MG tablet, Take 1 tablet (100 mg total) by mouth daily.  Current Outpatient Medications  (Respiratory):  .  fluticasone (FLONASE) 50 MCG/ACT nasal spray, Place 2 sprays into both nostrils daily.  Current Outpatient Medications (Analgesics):  .  celecoxib (CELEBREX) 200 MG capsule, One to 2 tablets by mouth daily as needed for pain. .  celecoxib (CELEBREX) 200 MG capsule, Take 1 capsule by mouth daily.   Current Outpatient Medications (Other):  Marland Kitchen  ALPRAZolam (XANAX) 0.5 MG tablet, Take 1 tablet (0.5 mg total) by mouth 3 (three) times daily as needed. for anxiety .  hydrOXYzine (ATARAX/VISTARIL) 10 MG tablet, Take 1 tablet (10 mg total) by mouth every 8 (eight) hours as needed. .  misoprostol (CYTOTEC) 200 MCG tablet, Take 2 pills by mouth the night before IUD removal. .  venlafaxine XR (EFFEXOR-XR) 75 MG 24 hr capsule, TAKE 1 CAPSULE(75 MG) BY MOUTH DAILY WITH BREAKFAST .  Vitamin D, Ergocalciferol, (DRISDOL) 1.25 MG (50000 UT) CAPS capsule, Take 1 capsule (50,000 Units total) by mouth every 7 (seven) days. .  Vitamin D, Ergocalciferol, (DRISDOL) 1.25 MG (50000 UT) CAPS capsule, Take 1 capsule (50,000 Units total) by mouth every 7 (seven) days.    Past medical history, social, surgical and family history all reviewed in electronic medical record.  Tamara [name] pertanent information unless stated regarding to the chief complaint.   Review of Systems:  Tamara [name] headache, visual changes, nausea, vomiting, diarrhea, constipation, dizziness, abdominal pain, skin rash, fevers, chills, night sweats, weight loss, swollen lymph nodes, body aches, joint swelling, muscle aches, chest pain, shortness of breath, mood changes.   Objective  Blood pressure 102/78, pulse (!) 103, height 5\' 1"  (1.549 m), weight 160 lb (72.6 kg), last menstrual period 06/28/2016, SpO2 98 %.    General: Tamara [name] apparent distress alert and oriented x3 mood and affect normal, dressed appropriately.  HEENT: Pupils equal, extraocular movements intact  Respiratory: Patient's speak in full sentences and does not appear short of breath    Cardiovascular: Tamara [name] lower extremity edema, non tender, Tamara [name] erythema  Skin: Warm dry intact with Tamara [name] signs of infection or rash on extremities or on axial skeleton.  Abdomen: Soft nontender  Neuro: Cranial nerves II through XII are intact, neurovascularly intact in all extremities with 2+ DTRs and 2+ pulses.  Lymph: Tamara [name] lymphadenopathy of posterior or anterior cervical chain or axillae bilaterally.  Gait normal with good balance and coordination.  MSK:  tender with full range of motion and good stability and symmetric strength and tone of  elbows, wrist, hip, knee and ankles bilaterally.  Right shoulder exam shows the patient does have mild positive impingement with Hawkins and Neer's.  Patient does have positive crossover.  Severe tenderness over the acromioclavicular joint.  Bilateral thumb exam shows some mild thenar eminence wasting but severe on the left side.  Severely positive grind test noted.  Patient  does have mild weakness on the left side compared to the right.  Back exam does have loss of lordosis.  Negative straight leg test.  Tender to palpation of the sacroiliac joints bilaterally right greater than left.  Osteopathic findings  T9 extended rotated and side bent left L2 flexed rotated and side bent right Sacrum right on right   After verbal consent patient was prepped with alcohol swabs and with a 25-gauge half inch needle injected with 0.5 cc of 0.5% Marcaine and 0.5 cc of Kenalog 40 mg/mL into the right acromioclavicular joint.  Tamara [name] blood loss.  Band-Aid placed.  Postinjection instructions given Impression and Recommendations:     This case required medical decision making of moderate complexity. The above documentation has been reviewed and is accurate and complete [name], DO       Note: This dictation was prepared with Dragon dictation along with smaller phrase technology. Any transcriptional errors that result from this process are unintentional.

## 2019-02-27 NOTE — Patient Instructions (Signed)
See me again in 6 weeks 

## 2019-02-28 ENCOUNTER — Encounter: Payer: Self-pay | Admitting: Family Medicine

## 2019-02-28 DIAGNOSIS — M19011 Primary osteoarthritis, right shoulder: Secondary | ICD-10-CM | POA: Insufficient documentation

## 2019-02-28 NOTE — Assessment & Plan Note (Signed)
Patient does have spinal stenosis noted.  Responding very well to osteopathic manipulation over the course of several years.  Patient wants to avoid any other epidural at the moment.  We will continue with conservative therapy.  Patient will continue with the Effexor for more than neuromodulator.  Once weekly vitamin D.  Follow-up with me again in 4 to 8 weeks

## 2019-02-28 NOTE — Assessment & Plan Note (Signed)
Patient given injection today.  Tolerated procedure well, discussed icing regimen and home exercise, discussed which activities to do which wants to avoid.  Patient will increase activity slowly over the course the next several weeks.  Follow-up with me again in 6 to 8 weeks.  At follow-up if continuing to have difficulties we will do a ultrasound.

## 2019-02-28 NOTE — Assessment & Plan Note (Signed)
Decision today to treat with OMT was based on Physical Exam  After verbal consent patient was treated with HVLA, ME, FPR techniques in cervical, thoracic, lumbar and sacral areas  Patient tolerated the procedure well with improvement in symptoms   Patient given exercises, stretches and lifestyle modifications  See medications in patient instructions if given  Patient will follow up in 6 weeks

## 2019-03-04 ENCOUNTER — Encounter: Payer: Self-pay | Admitting: Family Medicine

## 2019-03-08 ENCOUNTER — Encounter: Payer: Self-pay | Admitting: Family Medicine

## 2019-03-19 ENCOUNTER — Encounter: Payer: Self-pay | Admitting: Family Medicine

## 2019-03-19 ENCOUNTER — Ambulatory Visit: Payer: 59 | Admitting: Family Medicine

## 2019-03-19 ENCOUNTER — Other Ambulatory Visit: Payer: Self-pay

## 2019-03-19 VITALS — BP 125/84 | HR 72 | Wt 169.0 lb

## 2019-03-19 DIAGNOSIS — Z975 Presence of (intrauterine) contraceptive device: Secondary | ICD-10-CM

## 2019-03-19 DIAGNOSIS — Z538 Procedure and treatment not carried out for other reasons: Secondary | ICD-10-CM | POA: Diagnosis not present

## 2019-03-19 DIAGNOSIS — Z30432 Encounter for removal of intrauterine contraceptive device: Secondary | ICD-10-CM

## 2019-03-19 NOTE — Progress Notes (Signed)
   Subjective:    Patient ID: Tamara [name] is a 59 y.o. female presenting with Removal of IUD  on 03/19/2019  HPI: Here for attempt at IUD removal. Scheduled for removal under anesthesia already. She wants to avoid anesthesia. Already failed in office attempt x 1.  Review of Systems  Constitutional: Negative for chills and fever.  Respiratory: Negative for shortness of breath.   Cardiovascular: Negative for chest pain.  Gastrointestinal: Negative for abdominal pain, nausea and vomiting.  Genitourinary: Negative for dysuria.  Skin: Negative for rash.      Objective:    BP 125/84   Pulse 72   Wt 169 lb (76.7 kg)   LMP 06/28/2016 (Approximate)   BMI 31.93 kg/m  Physical Exam Constitutional:      General: She is not in acute distress.    Appearance: She is well-developed.  HENT:     Head: Normocephalic and atraumatic.  Eyes:     General: No scleral icterus. Cardiovascular:     Rate and Rhythm: Normal rate.  Pulmonary:     Effort: Pulmonary effort is normal.  Abdominal:     Palpations: Abdomen is soft.  Musculoskeletal:     Cervical back: Neck supple.  Skin:    General: Skin is warm and dry.  Neurological:     Mental Status: She is alert and oriented to person, place, and time.    Procedure: Speculum placed inside vagina.  Cervix visualized.  Os finder used to dilate the cervix. Attempted with straight kelly and IUD hook. Patient could not tolerate furterh attempts at removal in the office.      Assessment & Plan:  Attempted IUD removal, unsuccessful - scheduled for the OR.  Return if symptoms worsen or fail to improve.  Donnamae Jude 03/19/2019 4:21 PM

## 2019-03-20 ENCOUNTER — Encounter: Payer: Self-pay | Admitting: Family Medicine

## 2019-03-20 ENCOUNTER — Other Ambulatory Visit: Payer: Self-pay

## 2019-03-20 MED ORDER — LEVOTHYROXINE SODIUM 50 MCG PO TABS: 50.0000 ug | ORAL_TABLET | Freq: Every day | ORAL | 3 refills | Status: DC

## 2019-04-03 ENCOUNTER — Ambulatory Visit (HOSPITAL_BASED_OUTPATIENT_CLINIC_OR_DEPARTMENT_OTHER): Admit: 2019-04-03 | Payer: 59 | Admitting: Obstetrics & Gynecology

## 2019-04-03 ENCOUNTER — Encounter (HOSPITAL_BASED_OUTPATIENT_CLINIC_OR_DEPARTMENT_OTHER): Payer: Self-pay

## 2019-04-03 SURGERY — REMOVAL, INTRAUTERINE DEVICE
Anesthesia: Choice

## 2019-04-07 NOTE — Progress Notes (Signed)
[facility] [address] [place] Phone: [phone] Subjective:   I Tamara [name] am serving as a Education administrator for Dr. Hulan Saas.  This visit occurred during the SARS-CoV-2 public health emergency.  Safety protocols were in place, including screening questions prior to the visit, additional usage of staff PPE, and extensive cleaning of exam room while observing appropriate contact time as indicated for disinfecting solutions.   I'm seeing this patient by the request  of:  [name], MD  CC: Neck and back pain follow-up  [id number]   02/27/2019 Patient given injection today.  Tolerated procedure well, discussed icing regimen and home exercise, discussed which activities to do which wants to avoid.  Patient will increase activity slowly over the course the next several weeks.  Follow-up with me again in 6 to 8 weeks.  At follow-up if continuing to have difficulties we will do a ultrasound.  Update 04/10/2019 Tamara [name] is a 59 y.o. female coming in with complaint of thumb and back pain. Last seen for OMT on 02/27/2019. Surgery on thumb on 04/02/2019. Patient states Patient recently had surgery on her [facility]: [name] Tamara W joint. Patient states her back is hurting today due to having to stay seated to reduce swelling of her hand. Right hand is starting to have the same symptoms as the left.       Past Medical History:  Diagnosis Date  . Allergic rhinitis, mild   . Anemia   . Depression   . Hypertension   . Ovarian cyst    Past Surgical History:  Procedure Laterality Date  . LAMINECTOMY    . LUMBAR EPIDURAL INJECTION  11/03/2014   Social History   Socioeconomic History  . Marital status: Married    Spouse name: Not on file  . Number of children: 0  . Years of education: Not on file  . Highest education level: Not on file  Occupational History  . Not on file  Tobacco Use  . Smoking status: Never Smoker  . Smokeless tobacco: Never  Used  Substance and Sexual Activity  . Alcohol use: Yes    Alcohol/week: 3.0 standard drinks    Types: 3 Standard drinks or equivalent per week  . Drug use: No  . Sexual activity: Yes    Partners: Male    Birth control/protection: I.U.D.  Other Topics Concern  . Not on file  Social History Narrative  . Not on file   Social Determinants of Health   Financial Resource Strain:   . Difficulty of Paying Living Expenses: Not on file  Food Insecurity:   . Worried About Charity fundraiser in the Last Year: Not on file  . Ran Out of Food in the Last Year: Not on file  Transportation Needs:   . Lack of Transportation (Medical): Not on file  . Lack of Transportation (Non-Medical): Not on file  Physical Activity:   . Days of Exercise per Week: Not on file  . Minutes of Exercise per Session: Not on file  Stress:   . Feeling of Stress : Not on file  Social Connections:   . Frequency of Communication with Friends and Family: Not on file  . Frequency of Social Gatherings with Friends and Family: Not on file  . Attends Religious Services: Not on file  . Active Member of Clubs or Organizations: Not on file  . Attends Archivist Meetings: Not on file  . Marital Status: Not on file  Allergies  Allergen Reactions  . Gabapentin Swelling   Family History  Problem Relation Age of Onset  . Depression Mother   . Depression Father   . Alcohol abuse Father     Current Outpatient Medications (Endocrine & Metabolic):  .  levothyroxine (SYNTHROID) 50 MCG tablet, Take 1 tablet (50 mcg total) by mouth daily.  Current Outpatient Medications (Cardiovascular):  .  losartan (COZAAR) 100 MG tablet, Take 1 tablet (100 mg total) by mouth daily.  Current Outpatient Medications (Respiratory):  .  fluticasone (FLONASE) 50 MCG/ACT nasal spray, Place 2 sprays into both nostrils daily.  Current Outpatient Medications (Analgesics):  .  celecoxib (CELEBREX) 200 MG capsule, One to 2 tablets by  mouth daily as needed for pain. .  celecoxib (CELEBREX) 200 MG capsule, Take 1 capsule by mouth daily.   Current Outpatient Medications (Other):  Marland Kitchen  ALPRAZolam (XANAX) 0.5 MG tablet, Take 1 tablet (0.5 mg total) by mouth 3 (three) times daily as needed. for anxiety .  hydrOXYzine (ATARAX/VISTARIL) 10 MG tablet, Take 1 tablet (10 mg total) by mouth every 8 (eight) hours as needed. .  misoprostol (CYTOTEC) 200 MCG tablet, Take 2 pills by mouth the night before IUD removal. .  venlafaxine XR (EFFEXOR-XR) 75 MG 24 hr capsule, TAKE 1 CAPSULE(75 MG) BY MOUTH DAILY WITH BREAKFAST .  Vitamin D, Ergocalciferol, (DRISDOL) 1.25 MG (50000 UT) CAPS capsule, Take 1 capsule (50,000 Units total) by mouth every 7 (seven) days. .  Vitamin D, Ergocalciferol, (DRISDOL) 1.25 MG (50000 UT) CAPS capsule, Take 1 capsule (50,000 Units total) by mouth every 7 (seven) days.   Reviewed prior external information including notes and imaging from  primary care provider As well as notes that were available from care everywhere and other healthcare systems.  Past medical history, social, surgical and family history all reviewed in electronic medical record.  No pertanent information unless stated regarding to the chief complaint.   Review of Systems:  No headache, visual changes, nausea, vomiting, diarrhea, constipation, dizziness, abdominal pain, skin rash, fevers, chills, night sweats, weight loss, swollen lymph nodes, body aches, joint swelling, chest pain, shortness of breath, mood changes. POSITIVE muscle aches  Objective  Blood pressure 110/80, pulse (!) 106, height 5\' 1"  (1.549 m), weight 169 lb (76.7 kg), last menstrual period 06/28/2016, SpO2 98 %.   General: No apparent distress alert and oriented x3 mood and affect normal, dressed appropriately.  HEENT: Pupils equal, extraocular movements intact  Respiratory: Patient's speak in full sentences and does not appear short of breath  Cardiovascular: No lower  extremity edema, non tender, no erythema  Skin: Warm dry intact with no signs of infection or rash on extremities or on axial skeleton.  Abdomen: Soft nontender  Neuro: Cranial nerves II through XII are intact, neurovascularly intact in all extremities with 2+ DTRs and 2+ pulses.  Lymph: No lymphadenopathy of posterior or anterior cervical chain or axillae bilaterally.  Gait normal with good balance and coordination.  MSK:  Non tender with full range of motion and good stability and symmetric strength and tone of shoulders, elbows, , hip, knee and ankles bilaterally.  Left hand is in a cast.  Patient is wearing a sling mild swelling of the fingers noted Neck: Inspection mild loss of lordosis. No palpable stepoffs. Negative Spurling's maneuver. Full neck range of motion Grip strength and sensation normal in bilateral hands Strength good C4 to T1 distribution No sensory change to C4 to T1 Negative Hoffman sign bilaterally Reflexes normal\  Tightness noted in the trapezius bilaterally right greater than left   Back Exam:  Inspection: Unremarkable  Motion: Flexion 45 deg, Extension 45 deg, Side Bending to 45 deg bilaterally,  Rotation to 45 deg bilaterally  SLR laying: Negative  XSLR laying: Negative  Palpable tenderness: Mild tenderness to palpation of paraspinal musculature lumbar spine right greater than left. FABER: negative. Sensory change: Gross sensation intact to all lumbar and sacral dermatomes.  Reflexes: 2+ at both patellar tendons, 2+ at achilles tendons, Babinski's downgoing.  Strength at foot  Plantar-flexion: 5/5 Dorsi-flexion: 5/5 Eversion: 5/5 Inversion: 5/5  Leg strength  Quad: 5/5 Hamstring: 5/5 Hip flexor: 5/5 Hip abductors: 5/5  Gait unremarkable.  Osteopathic findings  C2 flexed rotated and side bent right T9 extended rotated and side bent left L2 flexed rotated and side bent right Sacrum right on right     Impression and Recommendations:     This case  required medical decision making of moderate complexity. The above documentation has been reviewed and is accurate and complete [name], DO       Note: This dictation was prepared with Dragon dictation along with smaller phrase technology. Any transcriptional errors that result from this process are unintentional.

## 2019-04-07 NOTE — Assessment & Plan Note (Signed)
Decision today to treat with OMT was based on Physical Exam  After verbal consent patient was treated with HVLA, ME, FPR techniques in cervical, thoracic, rib,  lumbar and sacral areas  Patient tolerated the procedure well with improvement in symptoms  Patient given exercises, stretches and lifestyle modifications  See medications in patient instructions if given  Patient will follow up in 4-8 weeks 

## 2019-04-07 NOTE — Assessment & Plan Note (Signed)
Chronic problem with mild exacerbation.  Responding fairly well to ergonomics, home exercises and osteopathic manipulation.  Medical management noted.  Continue current therapy.  Follow-up again in 4 to 8 weeks

## 2019-04-07 NOTE — Assessment & Plan Note (Signed)
Chronic problem with mild stenosis stenosis noted.  Discussed icing regimen and home exercise, which activities to do which wants to avoid.  Patient is to increase activity as tolerated.  Follow-up again in 4 to 8 weeks

## 2019-04-10 ENCOUNTER — Other Ambulatory Visit: Payer: Self-pay

## 2019-04-10 ENCOUNTER — Encounter: Payer: Self-pay | Admitting: Family Medicine

## 2019-04-10 ENCOUNTER — Ambulatory Visit (INDEPENDENT_AMBULATORY_CARE_PROVIDER_SITE_OTHER): Payer: 59 | Admitting: Family Medicine

## 2019-04-10 VITALS — BP 110/80 | HR 106 | Ht 61.0 in | Wt 169.0 lb

## 2019-04-10 DIAGNOSIS — M999 Biomechanical lesion, unspecified: Secondary | ICD-10-CM | POA: Diagnosis not present

## 2019-04-10 DIAGNOSIS — M48061 Spinal stenosis, lumbar region without neurogenic claudication: Secondary | ICD-10-CM | POA: Diagnosis not present

## 2019-04-10 DIAGNOSIS — M4722 Other spondylosis with radiculopathy, cervical region: Secondary | ICD-10-CM

## 2019-04-10 NOTE — Patient Instructions (Signed)
Sorry about your cat Thumb is looking good it has plenty of time See me again in 4-5 weeks

## 2019-04-23 ENCOUNTER — Encounter: Payer: Self-pay | Admitting: Family Medicine

## 2019-04-24 ENCOUNTER — Other Ambulatory Visit: Payer: Self-pay

## 2019-04-24 MED ORDER — MELOXICAM 15 MG PO TABS: 15.0000 mg | ORAL_TABLET | Freq: Every day | ORAL | 0 refills | Status: DC

## 2019-05-09 ENCOUNTER — Ambulatory Visit: Payer: 59 | Admitting: Family Medicine

## 2019-05-09 DIAGNOSIS — Z0289 Encounter for other administrative examinations: Secondary | ICD-10-CM

## 2019-05-09 NOTE — Progress Notes (Deleted)
[place] Phone: [phone] Subjective:    I'm seeing this patient by the request  of:  [name], MD  CC:   [id number]  Tamara [name] is a 59 y.o. female coming in with complaint of back pain. Last seen on 04/10/2019 for OMT. Patient states        Past Medical History:  Diagnosis Date  . Allergic rhinitis, mild   . Anemia   . Depression   . Hypertension   . Ovarian cyst    Past Surgical History:  Procedure Laterality Date  . LAMINECTOMY    . LUMBAR EPIDURAL INJECTION  11/03/2014   Social History   Socioeconomic History  . Marital status: Married    Spouse name: Not on file  . Number of children: 0  . Years of education: Not on file  . Highest education level: Not on file  Occupational History  . Not on file  Tobacco Use  . Smoking status: Never Smoker  . Smokeless tobacco: Never Used  Substance and Sexual Activity  . Alcohol use: Yes    Alcohol/week: 3.0 standard drinks    Types: 3 Standard drinks or equivalent per week  . Drug use: No  . Sexual activity: Yes    Partners: Male    Birth control/protection: I.U.D.  Other Topics Concern  . Not on file  Social History Narrative  . Not on file   Social Determinants of Health   Financial Resource Strain:   . Difficulty of Paying Living Expenses:   Food Insecurity:   . Worried About Charity fundraiser in the Last Year:   . Arboriculturist in the Last Year:   Transportation Needs:   . Film/video editor (Medical):   Marland Kitchen Lack of Transportation (Non-Medical):   Physical Activity:   . Days of Exercise per Week:   . Minutes of Exercise per Session:   Stress:   . Feeling of Stress :   Social Connections:   . Frequency of Communication with Friends and Family:   . Frequency of Social Gatherings with Friends and Family:   . Attends Religious Services:   . Active Member of Clubs or Organizations:   . Attends Theatre manager Meetings:   Marland Kitchen Marital Status:    Allergies  Allergen Reactions  . Gabapentin Swelling   Family History  Problem Relation Age of Onset  . Depression Mother   . Depression Father   . Alcohol abuse Father     Current Outpatient Medications (Endocrine & Metabolic):  .  levothyroxine (SYNTHROID) 50 MCG tablet, Take 1 tablet (50 mcg total) by mouth daily.  Current Outpatient Medications (Cardiovascular):  .  losartan (COZAAR) 100 MG tablet, Take 1 tablet (100 mg total) by mouth daily.  Current Outpatient Medications (Respiratory):  .  fluticasone (FLONASE) 50 MCG/ACT nasal spray, Place 2 sprays into both nostrils daily.  Current Outpatient Medications (Analgesics):  .  celecoxib (CELEBREX) 200 MG capsule, One to 2 tablets by mouth daily as needed for pain. .  celecoxib (CELEBREX) 200 MG capsule, Take 1 capsule by mouth daily. .  meloxicam (MOBIC) 15 MG tablet, Take 1 tablet (15 mg total) by mouth daily.   Current Outpatient Medications (Other):  Marland Kitchen  ALPRAZolam (XANAX) 0.5 MG tablet, Take 1 tablet (0.5 mg total) by mouth 3 (three) times daily as needed. for anxiety .  hydrOXYzine (ATARAX/VISTARIL) 10 MG tablet, Take 1 tablet (  10 mg total) by mouth every 8 (eight) hours as needed. .  misoprostol (CYTOTEC) 200 MCG tablet, Take 2 pills by mouth the night before IUD removal. .  venlafaxine XR (EFFEXOR-XR) 75 MG 24 hr capsule, TAKE 1 CAPSULE(75 MG) BY MOUTH DAILY WITH BREAKFAST .  Vitamin D, Ergocalciferol, (DRISDOL) 1.25 MG (50000 UT) CAPS capsule, Take 1 capsule (50,000 Units total) by mouth every 7 (seven) days. .  Vitamin D, Ergocalciferol, (DRISDOL) 1.25 MG (50000 UT) CAPS capsule, Take 1 capsule (50,000 Units total) by mouth every 7 (seven) days.   Reviewed prior external information including notes and imaging from  primary care provider As well as notes that were available from care everywhere and other healthcare systems.  Past medical history, social, surgical and  family history all reviewed in electronic medical record.  No pertanent information unless stated regarding to the chief complaint.   Review of Systems:  No headache, visual changes, nausea, vomiting, diarrhea, constipation, dizziness, abdominal pain, skin rash, fevers, chills, night sweats, weight loss, swollen lymph nodes, body aches, joint swelling, chest pain, shortness of breath, mood changes. POSITIVE muscle aches  Objective  Last menstrual period 06/28/2016.   General: No apparent distress alert and oriented x3 mood and affect normal, dressed appropriately.  HEENT: Pupils equal, extraocular movements intact  Respiratory: Patient's speak in full sentences and does not appear short of breath  Cardiovascular: No lower extremity edema, non tender, no erythema  Neuro: Cranial nerves II through XII are intact, neurovascularly intact in all extremities with 2+ DTRs and 2+ pulses.  Gait normal with good balance and coordination.  MSK:  Non tender with full range of motion and good stability and symmetric strength and tone of shoulders, elbows, wrist, hip, knee and ankles bilaterally.     Impression and Recommendations:     This case required medical decision making of moderate complexity. The above documentation has been reviewed and is accurate and complete Jacqualin Combes       Note: This dictation was prepared with Dragon dictation along with smaller phrase technology. Any transcriptional errors that result from this process are unintentional.

## 2019-05-13 ENCOUNTER — Encounter: Payer: Self-pay | Admitting: Family Medicine

## 2019-05-14 ENCOUNTER — Encounter (HOSPITAL_BASED_OUTPATIENT_CLINIC_OR_DEPARTMENT_OTHER): Payer: Self-pay

## 2019-05-14 ENCOUNTER — Ambulatory Visit (HOSPITAL_BASED_OUTPATIENT_CLINIC_OR_DEPARTMENT_OTHER): Admit: 2019-05-14 | Payer: 59 | Admitting: Family Medicine

## 2019-05-14 SURGERY — DILATION AND CURETTAGE
Anesthesia: Choice

## 2019-05-18 ENCOUNTER — Ambulatory Visit: Payer: 59 | Attending: Internal Medicine

## 2019-05-18 DIAGNOSIS — Z23 Encounter for immunization: Secondary | ICD-10-CM

## 2019-05-18 NOTE — Progress Notes (Signed)
   Covid-[facility] Vaccination [facility]  Name:  HURLEY BOSEN    MRN: [id number] DOB: [birth year]  05/18/2019  Ms. Burruel was observed post Covid-19 immunization for 15 minutes without incident. She was provided with Vaccine Information Sheet and instruction to access the V-Safe system.   Ms. Waldmann was instructed to call 911 with any severe reactions post vaccine: Marland Kitchen Difficulty breathing  . Swelling of face and throat  . A fast heartbeat  . A bad rash all over body  . Dizziness and weakness   Immunizations Administered    Name Date Dose VIS Date Route   Pfizer COVID-19 Vaccine 05/18/2019 11:14 AM 0.3 mL 01/25/2019 Intramuscular   Manufacturer: [place]   Lot: [id number]   [place]: [id number]

## 2019-05-23 ENCOUNTER — Encounter: Payer: Self-pay | Admitting: Family Medicine

## 2019-05-27 ENCOUNTER — Other Ambulatory Visit: Payer: Self-pay

## 2019-05-27 ENCOUNTER — Encounter: Payer: Self-pay | Admitting: Family Medicine

## 2019-05-27 ENCOUNTER — Ambulatory Visit: Payer: 59 | Admitting: Family Medicine

## 2019-05-27 VITALS — BP 102/66 | HR 106 | Ht 61.0 in | Wt 166.0 lb

## 2019-05-27 DIAGNOSIS — M999 Biomechanical lesion, unspecified: Secondary | ICD-10-CM | POA: Diagnosis not present

## 2019-05-27 DIAGNOSIS — M2550 Pain in unspecified joint: Secondary | ICD-10-CM | POA: Diagnosis not present

## 2019-05-27 DIAGNOSIS — M48061 Spinal stenosis, lumbar region without neurogenic claudication: Secondary | ICD-10-CM | POA: Diagnosis not present

## 2019-05-27 NOTE — Assessment & Plan Note (Signed)
Chronic problem with exacerbation.  Discussed icing regimen and home exercise, discussed which activities to doing which wants to avoid.  Discussed medications including Celebrex that patient is taking once a day and is able to increase to twice a day if necessary.  Patient though is going to monitor for any side effects.  Does respond fairly well to osteopathic manipulation.  Follow-up again in 6 weeks

## 2019-05-27 NOTE — Patient Instructions (Signed)
Good to see you  Sorry for your loss  We will get a thyroid panel  See me again in 4-6 weeks

## 2019-05-27 NOTE — Progress Notes (Signed)
[place] Phone: [phone] Subjective:   [name], am serving as a scribe for Dr. Hulan Saas. This visit occurred during the SARS-CoV-2 public health emergency.  Safety protocols were in place, including screening questions prior to the visit, additional usage of staff PPE, and extensive cleaning of exam room while observing appropriate contact time as indicated for disinfecting solutions.   I'm seeing this patient by the request  of:  [name], MD  CC: Neck pain, back pain  [id number]  Tamara [name] is a 59 y.o. female coming in with complaint of back pain. Last seen on 04/10/2019 for OMT. Patient states that her back is stiff due to travel and inactivity after surgery.  Patient did have to travel in a car multiple times.  Patient had traveled over 1000 miles in the 2-week time period.  Patient had a death in the family.  Is 8 weeks out of surgery for her thumb.       Past Medical History:  Diagnosis Date  . Allergic rhinitis, mild   . Anemia   . Depression   . Hypertension   . Ovarian cyst    Past Surgical History:  Procedure Laterality Date  . LAMINECTOMY    . LUMBAR EPIDURAL INJECTION  11/03/2014   Social History   Socioeconomic History  . Marital status: Married    Spouse name: Not on file  . Number of children: 0  . Years of education: Not on file  . Highest education level: Not on file  Occupational History  . Not on file  Tobacco Use  . Smoking status: Never Smoker  . Smokeless tobacco: Never Used  Substance and Sexual Activity  . Alcohol use: Yes    Alcohol/week: 3.0 standard drinks    Types: 3 Standard drinks or equivalent per week  . Drug use: No  . Sexual activity: Yes    Partners: Male    Birth control/protection: I.U.D.  Other Topics Concern  . Not on file  Social History Narrative  . Not on file   Social Determinants of Health   Financial  Resource Strain:   . Difficulty of Paying Living Expenses:   Food Insecurity:   . Worried About Charity fundraiser in the Last Year:   . Arboriculturist in the Last Year:   Transportation Needs:   . Film/video editor (Medical):   Marland Kitchen Lack of Transportation (Non-Medical):   Physical Activity:   . Days of Exercise per Week:   . Minutes of Exercise per Session:   Stress:   . Feeling of Stress :   Social Connections:   . Frequency of Communication with Friends and Family:   . Frequency of Social Gatherings with Friends and Family:   . Attends Religious Services:   . Active Member of Clubs or Organizations:   . Attends Archivist Meetings:   Marland Kitchen Marital Status:    Allergies  Allergen Reactions  . Gabapentin Swelling   Family History  Problem Relation Age of Onset  . Depression Mother   . Depression Father   . Alcohol abuse Father     Current Outpatient Medications (Endocrine & Metabolic):  .  levothyroxine (SYNTHROID) 50 MCG tablet, Take 1 tablet (50 mcg total) by mouth daily.  Current Outpatient Medications (Cardiovascular):  .  losartan (COZAAR) 100 MG tablet, Take 1 tablet (100 mg total) by mouth daily.  Current Outpatient  Medications (Respiratory):  .  fluticasone (FLONASE) 50 MCG/ACT nasal spray, Place 2 sprays into both nostrils daily.  Current Outpatient Medications (Analgesics):  .  celecoxib (CELEBREX) 200 MG capsule, One to 2 tablets by mouth daily as needed for pain. .  celecoxib (CELEBREX) 200 MG capsule, Take 1 capsule by mouth daily. .  meloxicam (MOBIC) 15 MG tablet, Take 1 tablet (15 mg total) by mouth daily.   Current Outpatient Medications (Other):  Marland Kitchen  ALPRAZolam (XANAX) 0.5 MG tablet, Take 1 tablet (0.5 mg total) by mouth 3 (three) times daily as needed. for anxiety .  hydrOXYzine (ATARAX/VISTARIL) 10 MG tablet, Take 1 tablet (10 mg total) by mouth every 8 (eight) hours as needed. .  misoprostol (CYTOTEC) 200 MCG tablet, Take 2 pills by  mouth the night before IUD removal. .  venlafaxine XR (EFFEXOR-XR) 75 MG 24 hr capsule, TAKE 1 CAPSULE(75 MG) BY MOUTH DAILY WITH BREAKFAST .  Vitamin D, Ergocalciferol, (DRISDOL) 1.25 MG (50000 UT) CAPS capsule, Take 1 capsule (50,000 Units total) by mouth every 7 (seven) days. .  Vitamin D, Ergocalciferol, (DRISDOL) 1.25 MG (50000 UT) CAPS capsule, Take 1 capsule (50,000 Units total) by mouth every 7 (seven) days.   Reviewed prior external information including notes and imaging from  primary care provider As well as notes that were available from care everywhere and other healthcare systems.  Past medical history, social, surgical and family history all reviewed in electronic medical record.  No pertanent information unless stated regarding to the chief complaint.   Review of Systems:  No headache, visual changes, nausea, vomiting, diarrhea, constipation, dizziness, abdominal pain, skin rash, fevers, chills, night sweats, weight loss, swollen lymph nodes, body aches, joint swelling, chest pain, shortness of breath, mood changes. POSITIVE muscle aches  Objective  Blood pressure 102/66, pulse (!) 106, height 5\' 1"  (1.549 m), weight 166 lb (75.3 kg), last menstrual period 06/28/2016, SpO2 98 %.   General: No apparent distress alert and oriented x3 mood and affect normal, dressed appropriately.  HEENT: Pupils equal, extraocular movements intact  Respiratory: Patient's speak in full sentences and does not appear short of breath  Cardiovascular: No lower extremity edema, non tender, no erythema  Neuro: Cranial nerves II through XII are intact, neurovascularly intact in all extremities with 2+ DTRs and 2+ pulses.  Gait normal with good balance and coordination.  MSK:  tender with full range of motion and good stability and symmetric strength and tone of shoulders, elbows, wrist, hip, knee and ankles bilaterally.  Cervical neck exam shows the patient does have loss of lordosis.  Tender to  palpation in the paraspinal musculature lumbar spine right greater than left.  Negative Spurling's but crepitus noted with range of motion testing.  Osteopathic findings  C2 flexed rotated and side bent right C4 flexed rotated and side bent left T9 extended rotated and side bent left L2 flexed rotated and side bent right Sacrum right on right    Impression and Recommendations:     This case required medical decision making of moderate complexity. The above documentation has been reviewed and is accurate and complete [name], DO       Note: This dictation was prepared with Dragon dictation along with smaller phrase technology. Any transcriptional errors that result from this process are unintentional.

## 2019-05-27 NOTE — Assessment & Plan Note (Signed)
   Decision today to treat with OMT was based on Physical Exam  After verbal consent patient was treated with HVLA, ME, FPR techniques in cervical, thoracic,  lumbar and sacral areas, all areas are chronic   Patient tolerated the procedure well with improvement in symptoms  Patient given exercises, stretches and lifestyle modifications  See medications in patient instructions if given  Patient will follow up in 4-8 weeks 

## 2019-05-28 LAB — THYROID PANEL WITH TSH
Free Thyroxine Index: 2.3 (ref 1.4–3.8)
T3 Uptake: 39 % — ABNORMAL HIGH (ref 22–35)
T4, Total: 5.8 ug/dL (ref 5.1–11.9)
TSH: 0.91 mIU/L (ref 0.40–4.50)

## 2019-06-11 ENCOUNTER — Encounter: Payer: Self-pay | Admitting: Family Medicine

## 2019-06-11 ENCOUNTER — Other Ambulatory Visit: Payer: Self-pay

## 2019-06-11 ENCOUNTER — Encounter: Payer: Self-pay | Admitting: Cardiovascular Disease

## 2019-06-11 MED ORDER — LOSARTAN POTASSIUM 100 MG PO TABS: 100.0000 mg | ORAL_TABLET | Freq: Every day | ORAL | 3 refills | Status: DC

## 2019-06-11 MED ORDER — HYDROXYZINE HCL 10 MG PO TABS: 10.0000 mg | ORAL_TABLET | Freq: Three times a day (TID) | ORAL | 3 refills | Status: DC | PRN

## 2019-06-12 ENCOUNTER — Ambulatory Visit: Payer: 59 | Admitting: Family Medicine

## 2019-06-17 ENCOUNTER — Encounter: Payer: Self-pay | Admitting: Family Medicine

## 2019-06-18 ENCOUNTER — Other Ambulatory Visit: Payer: Self-pay

## 2019-06-18 DIAGNOSIS — Z8249 Family history of ischemic heart disease and other diseases of the circulatory system: Secondary | ICD-10-CM

## 2019-07-04 ENCOUNTER — Ambulatory Visit: Payer: 59 | Admitting: Family Medicine

## 2019-07-04 ENCOUNTER — Encounter: Payer: Self-pay | Admitting: Family Medicine

## 2019-07-04 NOTE — Progress Notes (Deleted)
[place] Phone: [phone] Subjective:    I'm seeing this patient by the request  of:  [name], MD  CC:   [id number]  Tamara [name] is a 59 y.o. female coming in with complaint of back pain. Last seen on 05/27/2019 for OMT. Patient states      Past Medical History:  Diagnosis Date  . Allergic rhinitis, mild   . Anemia   . Depression   . Hypertension   . Ovarian cyst    Past Surgical History:  Procedure Laterality Date  . LAMINECTOMY    . LUMBAR EPIDURAL INJECTION  11/03/2014   Social History   Socioeconomic History  . Marital status: Married    Spouse name: Not on file  . Number of children: 0  . Years of education: Not on file  . Highest education level: Not on file  Occupational History  . Not on file  Tobacco Use  . Smoking status: Never Smoker  . Smokeless tobacco: Never Used  Substance and Sexual Activity  . Alcohol use: Yes    Alcohol/week: 3.0 standard drinks    Types: 3 Standard drinks or equivalent per week  . Drug use: No  . Sexual activity: Yes    Partners: Male    Birth control/protection: I.U.D.  Other Topics Concern  . Not on file  Social History Narrative  . Not on file   Social Determinants of Health   Financial Resource Strain:   . Difficulty of Paying Living Expenses:   Food Insecurity:   . Worried About Charity fundraiser in the Last Year:   . Arboriculturist in the Last Year:   Transportation Needs:   . Film/video editor (Medical):   Marland Kitchen Lack of Transportation (Non-Medical):   Physical Activity:   . Days of Exercise per Week:   . Minutes of Exercise per Session:   Stress:   . Feeling of Stress :   Social Connections:   . Frequency of Communication with Friends and Family:   . Frequency of Social Gatherings with Friends and Family:   . Attends Religious Services:   . Active Member of Clubs or Organizations:   . Attends Theatre manager Meetings:   Marland Kitchen Marital Status:    Allergies  Allergen Reactions  . Gabapentin Swelling   Family History  Problem Relation Age of Onset  . Depression Mother   . Depression Father   . Alcohol abuse Father     Current Outpatient Medications (Endocrine & Metabolic):  .  levothyroxine (SYNTHROID) 50 MCG tablet, Take 1 tablet (50 mcg total) by mouth daily.  Current Outpatient Medications (Cardiovascular):  .  losartan (COZAAR) 100 MG tablet, Take 1 tablet (100 mg total) by mouth daily.  Current Outpatient Medications (Respiratory):  .  fluticasone (FLONASE) 50 MCG/ACT nasal spray, Place 2 sprays into both nostrils daily.  Current Outpatient Medications (Analgesics):  .  celecoxib (CELEBREX) 200 MG capsule, One to 2 tablets by mouth daily as needed for pain. .  celecoxib (CELEBREX) 200 MG capsule, Take 1 capsule by mouth daily. .  meloxicam (MOBIC) 15 MG tablet, Take 1 tablet (15 mg total) by mouth daily.   Current Outpatient Medications (Other):  Marland Kitchen  ALPRAZolam (XANAX) 0.5 MG tablet, Take 1 tablet (0.5 mg total) by mouth 3 (three) times daily as needed. for anxiety .  hydrOXYzine (ATARAX/VISTARIL) 10 MG tablet, Take 1 tablet (10 mg  total) by mouth every 8 (eight) hours as needed. .  misoprostol (CYTOTEC) 200 MCG tablet, Take 2 pills by mouth the night before IUD removal. .  venlafaxine XR (EFFEXOR-XR) 75 MG 24 hr capsule, TAKE 1 CAPSULE(75 MG) BY MOUTH DAILY WITH BREAKFAST .  Vitamin D, Ergocalciferol, (DRISDOL) 1.25 MG (50000 UT) CAPS capsule, Take 1 capsule (50,000 Units total) by mouth every 7 (seven) days. .  Vitamin D, Ergocalciferol, (DRISDOL) 1.25 MG (50000 UT) CAPS capsule, Take 1 capsule (50,000 Units total) by mouth every 7 (seven) days.   Reviewed prior external information including notes and imaging from  primary care provider As well as notes that were available from care everywhere and other healthcare systems.  Past medical history, social, surgical and  family history all reviewed in electronic medical record.  No pertanent information unless stated regarding to the chief complaint.   Review of Systems:  No headache, visual changes, nausea, vomiting, diarrhea, constipation, dizziness, abdominal pain, skin rash, fevers, chills, night sweats, weight loss, swollen lymph nodes, body aches, joint swelling, chest pain, shortness of breath, mood changes. POSITIVE muscle aches  Objective  Last menstrual period 06/28/2016.   General: No apparent distress alert and oriented x3 mood and affect normal, dressed appropriately.  HEENT: Pupils equal, extraocular movements intact  Respiratory: Patient's speak in full sentences and does not appear short of breath  Cardiovascular: No lower extremity edema, non tender, no erythema  Neuro: Cranial nerves II through XII are intact, neurovascularly intact in all extremities with 2+ DTRs and 2+ pulses.  Gait normal with good balance and coordination.  MSK:  Non tender with full range of motion and good stability and symmetric strength and tone of shoulders, elbows, wrist, hip, knee and ankles bilaterally.     Impression and Recommendations:     This case required medical decision making of moderate complexity. The above documentation has been reviewed and is accurate and complete [name], DO       Note: This dictation was prepared with Dragon dictation along with smaller phrase technology. Any transcriptional errors that result from this process are unintentional.

## 2019-07-09 ENCOUNTER — Ambulatory Visit (HOSPITAL_COMMUNITY): Payer: 59 | Attending: Family Medicine

## 2019-07-09 ENCOUNTER — Other Ambulatory Visit: Payer: Self-pay

## 2019-07-09 DIAGNOSIS — I77810 Thoracic aortic ectasia: Secondary | ICD-10-CM | POA: Diagnosis not present

## 2019-07-09 DIAGNOSIS — Z8249 Family history of ischemic heart disease and other diseases of the circulatory system: Secondary | ICD-10-CM

## 2019-07-09 DIAGNOSIS — I10 Essential (primary) hypertension: Secondary | ICD-10-CM | POA: Diagnosis not present

## 2019-07-09 DIAGNOSIS — R9431 Abnormal electrocardiogram [ECG] [EKG]: Secondary | ICD-10-CM | POA: Diagnosis not present

## 2019-07-12 ENCOUNTER — Encounter: Payer: Self-pay | Admitting: Family Medicine

## 2019-07-12 ENCOUNTER — Other Ambulatory Visit: Payer: Self-pay

## 2019-07-12 DIAGNOSIS — I5189 Other ill-defined heart diseases: Secondary | ICD-10-CM

## 2019-07-14 ENCOUNTER — Other Ambulatory Visit: Payer: Self-pay | Admitting: Family Medicine

## 2019-07-23 ENCOUNTER — Other Ambulatory Visit: Payer: Self-pay | Admitting: Internal Medicine

## 2019-07-23 NOTE — Telephone Encounter (Signed)
Done erx 

## 2019-07-26 ENCOUNTER — Other Ambulatory Visit: Payer: Self-pay | Admitting: Family Medicine

## 2019-07-30 ENCOUNTER — Encounter: Payer: Self-pay | Admitting: Family Medicine

## 2019-08-08 ENCOUNTER — Ambulatory Visit: Payer: 59 | Admitting: Family Medicine

## 2019-08-08 NOTE — Progress Notes (Deleted)
  [place] Phone: [phone] Subjective:    I'm seeing this patient by the request  of:  [name], MD  CC:   [id number]  Tamara [name] is a 59 y.o. female coming in with complaint of back and neck pain Patient states   Medications patient has been prescribed:   Taking:         Reviewed prior external information including notes and imaging from previsou exam, outside providers and external EMR if available.   As well as notes that were available from care everywhere and other healthcare systems.  Past medical history, social, surgical and family history all reviewed in electronic medical record.  No pertanent information unless stated regarding to the chief complaint.   Past Medical History:  Diagnosis Date  . Allergic rhinitis, mild   . Anemia   . Depression   . Hypertension   . Ovarian cyst     Allergies  Allergen Reactions  . Gabapentin Swelling     Review of Systems:  No headache, visual changes, nausea, vomiting, diarrhea, constipation, dizziness, abdominal pain, skin rash, fevers, chills, night sweats, weight loss, swollen lymph nodes, body aches, joint swelling, chest pain, shortness of breath, mood changes. POSITIVE muscle aches  Objective  Last menstrual period 06/28/2016.   General: No apparent distress alert and oriented x3 mood and affect normal, dressed appropriately.  HEENT: Pupils equal, extraocular movements intact  Respiratory: Patient's speak in full sentences and does not appear short of breath  Cardiovascular: No lower extremity edema, non tender, no erythema  Neuro: Cranial nerves II through XII are intact, neurovascularly intact in all extremities with 2+ DTRs and 2+ pulses.  Gait normal with good balance and coordination.  MSK:  Non tender with full range of motion and good stability and symmetric strength and tone of shoulders, elbows, wrist, hip, knee  and ankles bilaterally.  Back - Normal skin, Spine with normal alignment and no deformity.  No tenderness to vertebral process palpation.  Paraspinous muscles are not tender and without spasm.   Range of motion is full at neck and lumbar sacral regions  Osteopathic findings  C2 flexed rotated and side bent right C6 flexed rotated and side bent left T3 extended rotated and side bent right inhaled rib T9 extended rotated and side bent left L2 flexed rotated and side bent right Sacrum right on right       Assessment and Plan:    Nonallopathic problems  Decision today to treat with OMT was based on Physical Exam  After verbal consent patient was treated with HVLA, ME, FPR techniques in cervical, rib, thoracic, lumbar, and sacral  areas  Patient tolerated the procedure well with improvement in symptoms  Patient given exercises, stretches and lifestyle modifications  See medications in patient instructions if given  Patient will follow up in 4-8 weeks      The above documentation has been reviewed and is accurate and complete [name], DO       Note: This dictation was prepared with Dragon dictation along with smaller phrase technology. Any transcriptional errors that result from this process are unintentional.

## 2019-08-14 ENCOUNTER — Encounter: Payer: Self-pay | Admitting: Family Medicine

## 2019-09-02 ENCOUNTER — Encounter: Payer: Self-pay | Admitting: Cardiovascular Disease

## 2019-09-02 NOTE — Progress Notes (Signed)
Cardiology Office Note:    Date:  09/03/2019   ID:  EVERLINA GOTTS, DOB 1960/05/16, MRN [id number]  PCP:  [name], MD  [facility] Cardiologist:  Yarely Bebee   [facility] Electrophysiologist:  None   Referring MD: [name], DO   Chief Complaint  Patient presents with  . Hypertension  . Congestive Heart Failure     History of Present Illness:    SARIKA BALDINI is a 59 y.o. 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  She has grade 1 diastolic dysfunction. Has very minimal aortic dilitation Mother died at age 48 of a thoracic aortic aneurims Thought to be due  Marfans syndrome  Mother had multple signs of marfans - myopia, inverted nipple  Joliene also has similar characteristics  multioke family members have Marfans   Pt also has signs of marfarns , Myopia, inverted nipple,  Now finally found to have mildly dilated ascending aorta  No cp,  No dyspnea  Hx of  HTN .   Past Medical History:  Diagnosis Date  . Allergic rhinitis, mild   . Anemia   . Depression   . Hypertension   . Ovarian cyst     Past Surgical History:  Procedure Laterality Date  . LAMINECTOMY    . LUMBAR EPIDURAL INJECTION  11/03/2014    Current Medications: Current Meds  Medication Sig  . ALPRAZolam (XANAX) 0.5 MG tablet TAKE 1 TABLET(0.5 MG) BY MOUTH THREE TIMES DAILY AS NEEDED FOR ANXIETY  . hydrOXYzine (ATARAX/VISTARIL) 10 MG tablet Take 1 tablet (10 mg total) by mouth every 8 (eight) hours as needed.  Marland Kitchen levothyroxine (SYNTHROID) 50 MCG tablet TAKE 1 TABLET(50 MCG) BY MOUTH DAILY  . meloxicam (MOBIC) 15 MG tablet TAKE 1 TABLET(15 MG) BY MOUTH DAILY  . venlafaxine XR (EFFEXOR-XR) 75 MG 24 hr capsule TAKE 1 CAPSULE(75 MG) BY MOUTH DAILY WITH  BREAKFAST  . [DISCONTINUED] losartan (COZAAR) 100 MG tablet Take 1 tablet (100 mg total) by mouth daily.     Allergies:   Gabapentin, Quinolones, and Ciprofloxacin   Social History   Socioeconomic History  . Marital status: Married    Spouse name: Not on file  . Number of children: 0  . Years of education: Not on file  . Highest education level: Not on file  Occupational History  . Not on file  Tobacco Use  . Smoking status: Never Smoker  . Smokeless tobacco: Never Used  Vaping Use  . Vaping Use: Never used  Substance and Sexual Activity  . Alcohol use: Yes    Alcohol/week: 3.0 standard drinks    Types: 3 Standard drinks or equivalent per week  . Drug use: No  . Sexual activity: Yes    Partners: Male    Birth control/protection: I.U.D.  Other Topics Concern  . Not on file  Social History Narrative  . Not on file   Social Determinants of Health   Financial Resource Strain:   . Difficulty of Paying Living Expenses:   Food Insecurity:   . Worried About Charity fundraiser in the Last Year:   . Arboriculturist in the Last Year:   Transportation Needs:   . Film/video editor (Medical):   Marland Kitchen Lack  of Transportation (Non-Medical):   Physical Activity:   . Days of Exercise per Week:   . Minutes of Exercise per Session:   Stress:   . Feeling of Stress :   Social Connections:   . Frequency of Communication with Friends and Family:   . Frequency of Social Gatherings with Friends and Family:   . Attends Religious Services:   . Active Member of Clubs or Organizations:   . Attends Archivist Meetings:   Marland Kitchen Marital Status:      Family History: The patient's family history includes Alcohol abuse in her father; Depression in her father and mother.  ROS:   Please see the history of present illness.     All other systems reviewed and are negative.  EKGs/Labs/Other Studies Reviewed:    The following studies were reviewed today:   EKG:     Recent  Labs: 11/12/2018: ALT 17; BUN 16; Creatinine, Ser 0.83; Hemoglobin 14.5; Platelets 304.0; Potassium 4.2; Sodium 140 05/27/2019: TSH 0.91  Recent Lipid Panel    Component Value Date/Time   CHOL 188 11/12/2018 1648   TRIG 147.0 11/12/2018 1648   HDL 92.00 11/12/2018 1648   CHOLHDL 2 11/12/2018 1648   VLDL 29.4 11/12/2018 1648   LDLCALC 66 11/12/2018 1648    Physical Exam:    VS:  BP 116/84   Pulse 95   Ht 5\' 1"  (1.549 m)   Wt 169 lb 6.4 oz (76.8 kg)   LMP 06/28/2016 (Approximate)   SpO2 97%   BMI 32.01 kg/m     Wt Readings from Last 3 Encounters:  09/03/19 169 lb 6.4 oz (76.8 kg)  05/27/19 166 lb (75.3 kg)  04/10/19 169 lb (76.7 kg)     GEN:  Well nourished, well developed in no acute distress, mild obesity  HEENT: Normal NECK: No JVD; No carotid bruits LYMPHATICS: No lymphadenopathy Breast:  Inverted left nipple  CARDIAC: RRR, no murmurs, rubs, gallops RESPIRATORY:  Clear to auscultation without rales, wheezing or rhonchi  ABDOMEN: Soft, non-tender, non-distended MUSCULOSKELETAL:  No edema; No deformity  SKIN: Warm and dry NEUROLOGIC:  Alert and oriented x 3 PSYCHIATRIC:  Normal affect   ASSESSMENT:    1. Essential hypertension   2. Palpable abdominal aorta   3. Pre-procedural cardiovascular examination   4. Family history of aortic aneurysm    PLAN:    In order of problems listed above:  Possible Marfan syndrome: Norberta presents for follow-up visit regarding Marfan syndrome.  I saw her 4 years ago.  Her echocardiogram at that time looked normal.  She recently had an echocardiogram which reveals mild dilatation of her  ascending aorta.  She has other signs and symptoms of Marfan syndrome including , myopia.   She has multiple family members who have classic Marfan symptoms/signs and also more subtle signs.  We will set her up for a CT angiogram of her aorta to further evaluate her aortic size. We will start her on metoprolol 25 mg twice a day to help limit  any shear stress on her aorta.  We will reduce the losartan to 50 mg a day.  Advised her to work on diet, exercise, weight loss.  She is to avoid lifting heavy weights.  I have advised her to avoid the flourquinolones.  I will see her back in 2-3 months .  She will send BP measurements by mychart.   Medication Adjustments/Labs and Tests Ordered: Current medicines are reviewed at length with the patient today.  Concerns regarding medicines are outlined above.  Orders Placed This Encounter  Procedures  . CT ANGIO CHEST AORTA W/CM & OR WO/CM  . Basic Metabolic Panel (BMET)  . Ambulatory referral to Genetics  . EKG 12-Lead   Meds ordered this encounter  Medications  . metoprolol tartrate (LOPRESSOR) 25 MG tablet    Sig: Take 1 tablet (25 mg total) by mouth 2 (two) times daily.    Dispense:  180 tablet    Refill:  3  . losartan (COZAAR) 50 MG tablet    Sig: Take 1 tablet (50 mg total) by mouth daily.    Dispense:  90 tablet    Refill:  3     Patient Instructions  Medication Instructions:  Your physician has recommended you make the following change in your medication: Start metoprolol 25 mg twice a day  Decrease Losartan to 50 mg a day     *If you need a refill on your cardiac medications before your next appointment, please call your pharmacy*   Lab Work: Bmet today   If you have labs (blood work) drawn today and your tests are completely normal, you will receive your results only by: Marland Kitchen MyChart Message (if you have MyChart) OR . A paper copy in the mail If you have any lab test that is abnormal or we need to change your treatment, we will call you to review the results.   Testing/Procedures: Dr. Acie Fredrickson wants you to have a Ct of the Aorta.        Follow-Up: At [facility], you and your health needs are our priority.  As part of our continuing mission to provide you with exceptional heart care, we have created designated Provider Care Teams.  These Care Teams  include your primary Cardiologist (physician) and Advanced Practice Providers (APPs -  Physician Assistants and Nurse Practitioners) who all work together to provide you with the care you need, when you need it.  We recommend signing up for the patient portal called "MyChart".  Sign up information is provided on this After Visit Summary.  MyChart is used to connect with patients for Virtual Visits (Telemedicine).  Patients are able to view lab/test results, encounter notes, upcoming appointments, etc.  Non-urgent messages can be sent to your provider as well.   To learn more about what you can do with MyChart, go to [email or web address].    Your next appointment:   3 month(s)  The format for your next appointment:   In Person  Provider:   Mertie Moores, MD   Other Instructions  Referral to Dr. Lattie Corns      Signed, Mertie Moores, MD  09/03/2019 5:47 PM    [place]

## 2019-09-03 ENCOUNTER — Ambulatory Visit: Payer: 59 | Admitting: Cardiovascular Disease

## 2019-09-03 ENCOUNTER — Encounter: Payer: Self-pay | Admitting: Cardiovascular Disease

## 2019-09-03 ENCOUNTER — Other Ambulatory Visit: Payer: Self-pay

## 2019-09-03 VITALS — BP 116/84 | HR 95 | Ht 61.0 in | Wt 169.4 lb

## 2019-09-03 DIAGNOSIS — Z01810 Encounter for preprocedural cardiovascular examination: Secondary | ICD-10-CM | POA: Diagnosis not present

## 2019-09-03 DIAGNOSIS — R0989 Other specified symptoms and signs involving the circulatory and respiratory systems: Secondary | ICD-10-CM | POA: Diagnosis not present

## 2019-09-03 DIAGNOSIS — Z8249 Family history of ischemic heart disease and other diseases of the circulatory system: Secondary | ICD-10-CM

## 2019-09-03 DIAGNOSIS — I10 Essential (primary) hypertension: Secondary | ICD-10-CM

## 2019-09-03 MED ORDER — LOSARTAN POTASSIUM 50 MG PO TABS
50.0000 mg | ORAL_TABLET | Freq: Every day | ORAL | 3 refills | Status: DC
Start: 2019-09-03 — End: 2019-09-13

## 2019-09-03 MED ORDER — METOPROLOL TARTRATE 25 MG PO TABS: 25.0000 mg | ORAL_TABLET | Freq: Two times a day (BID) | ORAL | 3 refills | Status: DC

## 2019-09-03 NOTE — Patient Instructions (Addendum)
Medication Instructions:  Your physician has recommended you make the following change in your medication: Start metoprolol 25 mg twice a day  Decrease Losartan to 50 mg a day     *If you need a refill on your cardiac medications before your next appointment, please call your pharmacy*   Lab Work: Bmet today   If you have labs (blood work) drawn today and your tests are completely normal, you will receive your results only by: Marland Kitchen MyChart Message (if you have MyChart) OR . A paper copy in the mail If you have any lab test that is abnormal or we need to change your treatment, we will call you to review the results.   Testing/Procedures: Dr. Acie Fredrickson wants you to have a Ct of the Aorta.        Follow-Up: At [facility], you and your health needs are our priority.  As part of our continuing mission to provide you with exceptional heart care, we have created designated Provider Care Teams.  These Care Teams include your primary Cardiologist (physician) and Advanced Practice Providers (APPs -  Physician Assistants and Nurse Practitioners) who all work together to provide you with the care you need, when you need it.  We recommend signing up for the patient portal called "MyChart".  Sign up information is provided on this After Visit Summary.  MyChart is used to connect with patients for Virtual Visits (Telemedicine).  Patients are able to view lab/test results, encounter notes, upcoming appointments, etc.  Non-urgent messages can be sent to your provider as well.   To learn more about what you can do with MyChart, go to [email or web address].    Your next appointment:   3 month(s)  The format for your next appointment:   In Person  Provider:   Mertie Moores, MD   Other Instructions  Referral to Dr. Lattie Corns

## 2019-09-04 LAB — BASIC METABOLIC PANEL
BUN/Creatinine Ratio: 17 (ref 9–23)
BUN: 15 mg/dL (ref 6–24)
CO2: 21 mmol/L (ref 20–29)
Calcium: 9.1 mg/dL (ref 8.7–10.2)
Chloride: 105 mmol/L (ref 96–106)
Creatinine, Ser: 0.86 mg/dL (ref 0.57–1.00)
GFR calc Af Amer: 87 mL/min/{1.73_m2} (ref >59)
GFR calc non Af Amer: 76 mL/min/{1.73_m2} (ref >59)
Glucose: 79 mg/dL (ref 65–99)
Potassium: 4.8 mmol/L (ref 3.5–5.2)
Sodium: 140 mmol/L (ref 134–144)

## 2019-09-11 ENCOUNTER — Other Ambulatory Visit: Payer: Self-pay

## 2019-09-11 ENCOUNTER — Ambulatory Visit (INDEPENDENT_AMBULATORY_CARE_PROVIDER_SITE_OTHER)
Admission: RE | Admit: 2019-09-11 | Discharge: 2019-09-11 | Disposition: A | Discharge status: Home / Self Care | Payer: 59 | Source: Ambulatory Visit | Attending: Cardiovascular Disease | Admitting: Cardiovascular Disease

## 2019-09-11 DIAGNOSIS — I10 Essential (primary) hypertension: Secondary | ICD-10-CM | POA: Diagnosis not present

## 2019-09-11 DIAGNOSIS — Z01810 Encounter for preprocedural cardiovascular examination: Secondary | ICD-10-CM

## 2019-09-11 DIAGNOSIS — R0989 Other specified symptoms and signs involving the circulatory and respiratory systems: Secondary | ICD-10-CM

## 2019-09-11 MED ORDER — IOHEXOL 350 MG/ML SOLN
100.0000 mL | Freq: Once | INTRAVENOUS | Status: AC | PRN
  Administered 2019-09-11: 100 mL via INTRAVENOUS

## 2019-09-12 ENCOUNTER — Telehealth: Payer: Self-pay | Admitting: *Deleted

## 2019-09-12 NOTE — Telephone Encounter (Signed)
-----   Message from Thayer Headings, MD sent at 09/12/2019  3:12 PM EDT ----- No significant aortic enlargement.   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 Will forward to Dr. Jenny Reichmann for follow up and further testing .  No new recs .   Will see her in 1 year for follow up .

## 2019-09-12 NOTE — Telephone Encounter (Signed)
lmtcb

## 2019-09-13 MED ORDER — LOSARTAN POTASSIUM 100 MG PO TABS
100.0000 mg | ORAL_TABLET | Freq: Every day | ORAL | 3 refills | Status: DC
Start: 2019-09-13 — End: 2020-07-08

## 2019-09-13 NOTE — Telephone Encounter (Signed)
Patient sent questions about test results through MyChart which have been addressed by Dr. Acie Fredrickson.

## 2019-09-18 ENCOUNTER — Ambulatory Visit: Payer: 59 | Admitting: Family Medicine

## 2019-09-18 NOTE — Progress Notes (Deleted)
[place] Phone: [phone] Subjective:    I'm seeing this patient by the request  of:  [name], MD  CC:   [id number]  Tamara [name] is a 58 y.o. female coming in with complaint of back pain. OMT 05/27/2019.  Onset-  Location Duration-  Character- Aggravating factors- Reliving factors-  Therapies tried-  Severity-     Past Medical History:  Diagnosis Date  . Allergic rhinitis, mild   . Anemia   . Depression   . Hypertension   . Ovarian cyst    Past Surgical History:  Procedure Laterality Date  . LAMINECTOMY    . LUMBAR EPIDURAL INJECTION  11/03/2014   Social History   Socioeconomic History  . Marital status: Married    Spouse name: Not on file  . Number of children: 0  . Years of education: Not on file  . Highest education level: Not on file  Occupational History  . Not on file  Tobacco Use  . Smoking status: Never Smoker  . Smokeless tobacco: Never Used  Vaping Use  . Vaping Use: Never used  Substance and Sexual Activity  . Alcohol use: Yes    Alcohol/week: 3.0 standard drinks    Types: 3 Standard drinks or equivalent per week  . Drug use: No  . Sexual activity: Yes    Partners: Male    Birth control/protection: I.U.D.  Other Topics Concern  . Not on file  Social History Narrative  . Not on file   Social Determinants of Health   Financial Resource Strain:   . Difficulty of Paying Living Expenses:   Food Insecurity:   . Worried About Charity fundraiser in the Last Year:   . Arboriculturist in the Last Year:   Transportation Needs:   . Film/video editor (Medical):   Marland Kitchen Lack of Transportation (Non-Medical):   Physical Activity:   . Days of Exercise per Week:   . Minutes of Exercise per Session:   Stress:   . Feeling of Stress :   Social Connections:   . Frequency of Communication with Friends and Family:   . Frequency of Social Gatherings with  Friends and Family:   . Attends Religious Services:   . Active Member of Clubs or Organizations:   . Attends Archivist Meetings:   Marland Kitchen Marital Status:    Allergies  Allergen Reactions  . Gabapentin Swelling  . Quinolones Other (See Comments)  . Ciprofloxacin Other (See Comments)    Hx of asc. Aortic dilitation.   Family hx of Marfans   Family History  Problem Relation Age of Onset  . Depression Mother   . Depression Father   . Alcohol abuse Father     Current Outpatient Medications (Endocrine & Metabolic):  .  levothyroxine (SYNTHROID) 50 MCG tablet, TAKE 1 TABLET(50 MCG) BY MOUTH DAILY  Current Outpatient Medications (Cardiovascular):  .  losartan (COZAAR) 100 MG tablet, Take 1 tablet (100 mg total) by mouth daily.   Current Outpatient Medications (Analgesics):  .  meloxicam (MOBIC) 15 MG tablet, TAKE 1 TABLET(15 MG) BY MOUTH DAILY   Current Outpatient Medications (Other):  Marland Kitchen  ALPRAZolam (XANAX) 0.5 MG tablet, TAKE 1 TABLET(0.5 MG) BY MOUTH THREE TIMES DAILY AS NEEDED FOR ANXIETY .  hydrOXYzine (ATARAX/VISTARIL) 10 MG tablet, Take 1 tablet (10 mg total) by mouth every 8 (eight) hours as needed. .  venlafaxine XR (EFFEXOR-XR)  75 MG 24 hr capsule, TAKE 1 CAPSULE(75 MG) BY MOUTH DAILY WITH BREAKFAST   Reviewed prior external information including notes and imaging from  primary care provider As well as notes that were available from care everywhere and other healthcare systems.  Past medical history, social, surgical and family history all reviewed in electronic medical record.  No pertanent information unless stated regarding to the chief complaint.   Review of Systems:  No headache, visual changes, nausea, vomiting, diarrhea, constipation, dizziness, abdominal pain, skin rash, fevers, chills, night sweats, weight loss, swollen lymph nodes, body aches, joint swelling, chest pain, shortness of breath, mood changes. POSITIVE muscle aches  Objective  Last menstrual  period 06/28/2016.   General: No apparent distress alert and oriented x3 mood and affect normal, dressed appropriately.  HEENT: Pupils equal, extraocular movements intact  Respiratory: Patient's speak in full sentences and does not appear short of breath  Cardiovascular: No lower extremity edema, non tender, no erythema  Neuro: Cranial nerves II through XII are intact, neurovascularly intact in all extremities with 2+ DTRs and 2+ pulses.  Gait normal with good balance and coordination.  MSK:  Non tender with full range of motion and good stability and symmetric strength and tone of shoulders, elbows, wrist, hip, knee and ankles bilaterally.     Impression and Recommendations:     The above documentation has been reviewed and is accurate and complete Tamara Pulley, DO       Note: This dictation was prepared with Dragon dictation along with smaller phrase technology. Any transcriptional errors that result from this process are unintentional.

## 2019-09-26 ENCOUNTER — Encounter: Payer: 59 | Admitting: Genetic Counselor

## 2019-09-30 ENCOUNTER — Encounter: Payer: Self-pay | Admitting: Family Medicine

## 2019-10-01 ENCOUNTER — Other Ambulatory Visit: Payer: Self-pay

## 2019-10-01 ENCOUNTER — Other Ambulatory Visit: Payer: 59

## 2019-10-01 DIAGNOSIS — Z20822 Contact with and (suspected) exposure to covid-19: Secondary | ICD-10-CM

## 2019-10-02 LAB — SARS-COV-2, NAA 2 DAY TAT

## 2019-10-02 LAB — NOVEL CORONAVIRUS, NAA: SARS-CoV-2, NAA: NOT DETECTED

## 2019-10-09 ENCOUNTER — Ambulatory Visit: Payer: 59 | Admitting: Family Medicine

## 2019-10-09 ENCOUNTER — Encounter: Payer: Self-pay | Admitting: Family Medicine

## 2019-10-09 ENCOUNTER — Other Ambulatory Visit: Payer: Self-pay

## 2019-10-09 VITALS — BP 106/80 | HR 100 | Ht 61.0 in | Wt 166.0 lb

## 2019-10-09 DIAGNOSIS — M999 Biomechanical lesion, unspecified: Secondary | ICD-10-CM

## 2019-10-09 DIAGNOSIS — M5416 Radiculopathy, lumbar region: Secondary | ICD-10-CM

## 2019-10-09 NOTE — Assessment & Plan Note (Addendum)
Not having any significant radicular symptoms at this time.  Has been sometime since we have seen patient.  Has been more of a little exacerbation recently.  Discussed which activities to do which wants to avoid.  Continue the meloxicam follow-up with me again in 4 to 8 weeks

## 2019-10-09 NOTE — Patient Instructions (Signed)
Meloxicam 5 days on then 2 days off Overall not as bad as anticipated See me in 6-8 weeks

## 2019-10-09 NOTE — Progress Notes (Signed)
[place] Phone: [phone] Subjective:   [name], am serving as a scribe for Dr. Hulan Saas. This visit occurred during the SARS-CoV-2 public health emergency.  Safety protocols were in place, including screening questions prior to the visit, additional usage of staff PPE, and extensive cleaning of exam room while observing appropriate contact time as indicated for disinfecting solutions.   I'm seeing this patient by the request  of:  [name], MD  CC: Neck and back pain follow-up  [id number]  Tamara [name] is a 59 y.o. female coming in with complaint of back and neck pain. OMT 05/27/2019. Patient states that she was using meloxicam daily following hand surgery. Stopped taking medication and neck pain increased. Patient is worried about using medication long term. Medications patient has been prescribed: Meloxicam  Taking: Yes         Reviewed prior external information including notes and imaging from previsou exam, outside providers and external EMR if available.   As well as notes that were available from care everywhere and other healthcare systems.  Past medical history, social, surgical and family history all reviewed in electronic medical record.  No pertanent information unless stated regarding to the chief complaint.   Past Medical History:  Diagnosis Date  . Allergic rhinitis, mild   . Anemia   . Depression   . Hypertension   . Ovarian cyst     Allergies  Allergen Reactions  . Gabapentin Swelling  . Quinolones Other (See Comments)  . Ciprofloxacin Other (See Comments)    Hx of asc. Aortic dilitation.   Family hx of Marfans     Review of Systems:  No headache, visual changes, nausea, vomiting, diarrhea, constipation, dizziness, abdominal pain, skin rash, fevers, chills, night sweats, weight loss, swollen lymph nodes, body aches, joint swelling, chest pain,  shortness of breath, mood changes. POSITIVE muscle aches  Objective  Blood pressure 106/80, pulse 100, height 5\' 1"  (1.549 m), weight 166 lb (75.3 kg), last menstrual period 06/28/2016, SpO2 98 %.   General: No apparent distress alert and oriented x3 mood and affect normal, dressed appropriately.  HEENT: Pupils equal, extraocular movements intact  Respiratory: Patient's speak in full sentences and does not appear short of breath  Cardiovascular: No lower extremity edema, non tender, no erythema  Neuro: Cranial nerves II through XII are intact, neurovascularly intact in all extremities with 2+ DTRs and 2+ pulses.  Gait normal with good balance and coordination.  MSK:  Non tender with full range of motion and good stability and symmetric strength and tone of shoulders, elbows, wrist, hip, knee and ankles bilaterally.  Back -low back exam does have some tenderness to palpation.  Patient does have some mild loss of lordosis.  Tightness noted with FABER test right greater than left.  Negative straight leg test.  Osteopathic findings  C2 flexed rotated and side bent right C7 flexed rotated and side bent left T9 extended rotated and side bent left L2 flexed rotated and side bent right Sacrum right on right       Assessment and Plan:    Nonallopathic problems  Decision today to treat with OMT was based on Physical Exam  After verbal consent patient was treated with HVLA, ME, FPR techniques in cervical, rib, thoracic, lumbar, and sacral  areas  Patient tolerated the procedure well with improvement in symptoms  Patient given exercises, stretches and lifestyle modifications  See medications  in patient instructions if given  Patient will follow up in 4-8 weeks      The above documentation has been reviewed and is accurate and complete Tamara Pulley, DO       Note: This dictation was prepared with Dragon dictation along with smaller phrase technology. Any transcriptional  errors that result from this process are unintentional.

## 2019-10-22 ENCOUNTER — Ambulatory Visit: Payer: 59 | Attending: Internal Medicine

## 2019-10-22 DIAGNOSIS — Z23 Encounter for immunization: Secondary | ICD-10-CM

## 2019-10-22 NOTE — Progress Notes (Signed)
   Covid-19 Vaccination Clinic  Name:  JANIEL DERHAMMER    MRN: [id number] DOB: [birth year]  10/22/2019  Ms. Roen was observed post Covid-19 immunization for 30 minutes based on pre-vaccination screening without incident. She was provided with Vaccine Information Sheet and instruction to access the V-Safe system.   Ms. Galla was instructed to call 911 with any severe reactions post vaccine: Marland Kitchen Difficulty breathing  . Swelling of face and throat  . A fast heartbeat  . A bad rash all over body  . Dizziness and weakness

## 2019-10-24 ENCOUNTER — Ambulatory Visit: Payer: 59 | Admitting: Genetic Counselor

## 2019-10-24 ENCOUNTER — Other Ambulatory Visit: Payer: Self-pay

## 2019-10-25 ENCOUNTER — Encounter: Payer: Self-pay | Admitting: Radiology

## 2019-10-29 ENCOUNTER — Other Ambulatory Visit: Payer: Self-pay | Admitting: Family Medicine

## 2019-11-01 NOTE — Progress Notes (Signed)
Referring Provider: Grayland Jack, MD   Referral Reason Donnalee Cellucci was referred for genetic consult of aortopathies in light of the significant family history  Personal Medical Information Makia (III.2 on pedigree) is a 59 year old pleasant Caucasian woman who has been undergoing screening for aortopathies every 3-5 years for the last few years stemming from a significant family history of sudden death due to a ruptured aortic aneurysm in her mother and maternal grandmother and suspicion of Marfan syndrome (MFS) in her brother. Her recent CT scan identified aortic dilatation (AoD) of 3.6 cm. She has no other features indicative of MFS.  Family history Tyleigh reports that her father woke up one night to the sounds of her mother (II.5) gasping. He tried to resuscitate her but to no avail. An autopsy was performed that identified a ruptured thoracic aortic aneurysm as the cause of death. The family was unaware of the thoracic aortic aneurysm. Jennifers maternal grandfather (I.3) also died in his sleep at age 43. His autopsy also identified a ruptured aortic aneurysm as cause of death. She states that he looked like Phillips Climes and had Marfanoid features.  Jennifers older brother, Elta Guadeloupe was found to have an AoD of 4.4 cm at age 47. He has myopia and inlight of their family history of TAA, he was thought to have Marfan syndrome. He had genetic testing for MFS and tested negative. Both his sons (V.1-V.2) undergo regular screening for AoD. Jennifers younger brother (III.3) had a bowel rupture at age 75. She reports that he looks Marfanoid, has pectus excavatum and myopia. His AoD is currently at 4.2 cm. His son (IV.5) had an abnormal echo with AoD of less than 4.2cm.  Pre-Test Genetic Consultation Notes  Marcille was counseled on the genetics of syndromes that present with aortic aneurysms and dissections, specifically Marfan syndrome (MFS), Loeys-Dietz syndrome (LDS), Vascular Ehlers-Danlos  Syndrome (vEDS) and other non-syndromic familial forms of thoracic aortic aneurysms and dissection (FTAAD). I discussed at length the scoring system for MFS and she believes that they do not fit the diagnostic criteria for MFS. We discussed inheritance, incomplete penetrance and variable expression associated with these conditions.   Impression  In summary, while Stuart does not have an AoD or suspicion of an aortopathy, there is a significant and dramatic family history of sudden death due to ruptured thoracic aneurysms in her maternal lineage, along with hollow organ rupture in her brother. Her older brother has undergone genetic testing- likely for MFS. I have asked her to send me his report to confirm if the genes for the other conditions, such as vEDS, LDS and FTAAD were excluded. If so, it would be of value for her younger brother to undergo genetic testing for the aortopathies as he is the most severely affected member in her family. Genetic testing Sonika for the aortopathies in not warranted as she is asymptomatic. She can be tested for the familial variant, once that has been identified in her younger brother. She verbalized understanding and affirms that she will try to obtain a copy of her elder brothers report.   In addition, we discussed the protections afforded by the Genetic Information Non-Discrimination Act (GINA). I explained to him that GINA protects him from losing employment or health insurance based on his genotype. However, these protections do not cover life insurance and disability. He verbalized understanding of this.  Please note that the patient has not undergone genetic counseling today to cope emotionally with having a likely genetic heart condition.  Lattie Corns, Ph.D, [facility] Clinical Molecular Geneticist

## 2019-11-13 ENCOUNTER — Encounter: Payer: 59 | Admitting: Internal Medicine

## 2019-11-27 ENCOUNTER — Ambulatory Visit: Payer: 59 | Admitting: Family Medicine

## 2019-11-27 ENCOUNTER — Encounter: Payer: Self-pay | Admitting: Family Medicine

## 2019-11-27 NOTE — Progress Notes (Signed)
No show

## 2019-11-28 ENCOUNTER — Other Ambulatory Visit: Payer: Self-pay | Admitting: Family Medicine

## 2019-12-05 ENCOUNTER — Other Ambulatory Visit: Payer: 59

## 2019-12-05 DIAGNOSIS — Z20822 Contact with and (suspected) exposure to covid-19: Secondary | ICD-10-CM

## 2019-12-06 LAB — NOVEL CORONAVIRUS, NAA: SARS-CoV-2, NAA: NOT DETECTED

## 2019-12-06 LAB — SARS-COV-2, NAA 2 DAY TAT

## 2019-12-10 ENCOUNTER — Encounter: Payer: Self-pay | Admitting: Cardiovascular Disease

## 2019-12-10 NOTE — Progress Notes (Signed)
Cardiology Office Note:    Date:  12/11/2019   ID:  Tamara [name], DOB 1960-05-09, MRN [id number]  PCP:  [name], MD  [facility] Cardiologist:  Amalia Edgecombe   [facility] Electrophysiologist:  None   Referring MD: [name], MD   Chief Complaint  Patient presents with  . Hypertension        Tamara [name] is a 59 y.o. 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  She has grade 1 diastolic dysfunction. Has very minimal aortic dilitation Mother died at age 92 of a thoracic aortic aneurims Thought to be due  Marfans syndrome  Mother had multple signs of marfans - myopia, inverted nipple  Tamara [name] also has similar characteristics  multioke family members have Marfans   Pt also has signs of marfarns , Myopia, inverted nipple,  Now finally found to have mildly dilated ascending aorta  No cp,  No dyspnea  Hx of  HTN .    Oct. 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 Was seen byDr. Lattie Corns on Sept. 9, 2021 She has remained asymptomatic.  Her aorta is normal sized. There is no indication to do broad genetic testing in [place] at this point but Dr. Broadus John has recommended testing for the family variant once its identified in her younger brother .  The mild aortic dilatation seen on echo was further evaluated by CT angio Her asc. Aorta measures 36 mm by CT scan      Past Medical History:  Diagnosis Date  . Allergic rhinitis, mild   . Anemia   . Depression   . Hypertension   . Ovarian cyst     Past Surgical History:  Procedure Laterality Date  . LAMINECTOMY    . LUMBAR EPIDURAL INJECTION  11/03/2014    Current Medications: Current Meds    Medication Sig  . ALPRAZolam (XANAX) 0.5 MG tablet TAKE 1 TABLET(0.5 MG) BY MOUTH THREE TIMES DAILY AS NEEDED FOR ANXIETY  . cetirizine (ZYRTEC) 10 MG tablet Take 1 tablet by mouth daily.  . hydrOXYzine (ATARAX/VISTARIL) 10 MG tablet TAKE 1 TABLET(10 MG) BY MOUTH EVERY 8 HOURS AS NEEDED  . levothyroxine (SYNTHROID) 50 MCG tablet TAKE 1 TABLET(50 MCG) BY MOUTH DAILY  . losartan (COZAAR) 100 MG tablet Take 1 tablet (100 mg total) by mouth daily.  . meloxicam (MOBIC) 15 MG tablet TAKE 1 TABLET(15 MG) BY MOUTH DAILY  . venlafaxine XR (EFFEXOR-XR) 75 MG 24 hr capsule TAKE 1 CAPSULE(75 MG) BY MOUTH DAILY WITH BREAKFAST     Allergies:   Gabapentin, Quinolones, and Ciprofloxacin   Social History   Socioeconomic History  . Marital status: Married    Spouse name: Not on file  . Number of children: 0  . Years of education: Not on file  . Highest education level: Not on file  Occupational History  . Not on file  Tobacco Use  . Smoking status: Never Smoker  . Smokeless tobacco: Never Used  Vaping Use  . Vaping Use: Never used  Substance and Sexual Activity  . Alcohol use: Yes    Alcohol/week: 3.0 standard drinks    Types: 3 Standard  drinks or equivalent per week  . Drug use: No  . Sexual activity: Yes    Partners: Male    Birth control/protection: I.U.D.  Other Topics Concern  . Not on file  Social History Narrative  . Not on file   Social Determinants of Health   Financial Resource Strain:   . Difficulty of Paying Living Expenses: Not on file  Food Insecurity:   . Worried About Charity fundraiser in the Last Year: Not on file  . Ran Out of Food in the Last Year: Not on file  Transportation Needs:   . Lack of Transportation (Medical): Not on file  . Lack of Transportation (Non-Medical): Not on file  Physical Activity:   . Days of Exercise per Week: Not on file  . Minutes of Exercise per Session: Not on file  Stress:   . Feeling of Stress : Not on file  Social Connections:    . Frequency of Communication with Friends and Family: Not on file  . Frequency of Social Gatherings with Friends and Family: Not on file  . Attends Religious Services: Not on file  . Active Member of Clubs or Organizations: Not on file  . Attends Archivist Meetings: Not on file  . Marital Status: Not on file     Family History: The patient's family history includes Alcohol abuse in her father; Depression in her father and mother.  ROS:   Please see the history of present illness.     All other systems reviewed and are negative.  EKGs/Labs/Other Studies Reviewed:    The following studies were reviewed today:   EKG:     Recent Labs: 05/27/2019: TSH 0.91 09/03/2019: BUN 15; Creatinine, Ser 0.86; Potassium 4.8; Sodium 140  Recent Lipid Panel    Component Value Date/Time   CHOL 188 11/12/2018 1648   TRIG 147.0 11/12/2018 1648   HDL 92.00 11/12/2018 1648   CHOLHDL 2 11/12/2018 1648   VLDL 29.4 11/12/2018 1648   LDLCALC 66 11/12/2018 1648    Physical Exam:    Physical Exam: Blood pressure 114/76, pulse 92, height 5\' 1"  (1.549 m), weight 172 lb 9.6 oz (78.3 kg), last menstrual period 06/28/2016, SpO2 98 %.  GEN:  Well nourished, well developed in no acute distress HEENT: Normal NECK: No JVD; No carotid bruits LYMPHATICS: No lymphadenopathy CARDIAC: RRR , no murmurs, rubs, gallops RESPIRATORY:  Clear to auscultation without rales, wheezing or rhonchi  ABDOMEN: Soft, non-tender, non-distended MUSCULOSKELETAL:  No edema; No deformity  SKIN: Warm and dry NEUROLOGIC:  Alert and oriented x 3  ECG: December 11, 2019: Normal sinus rhythm at 92 beats minute.  No ST or T wave changes.  ASSESSMENT:    No diagnosis found. 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 Lattie Corns.    Isaiah has numerous family members that have Marfan's or similar conditions.  Several have had aortic ruptures and hollow organ rupture.  Leonie does not have any  aortic dilatation but did have mild aortic dilatation by echo.  Her aorta measures 36 mm which is at the upper limits of normal.  I think it makes sense to continue to follow her.  We will get an echocardiogram next year.  If we see any growth in the size of her ascending aorta then we can confirm/verify with a CT angiogram.  She will continue with diet, exercise, weight loss efforts.  She will continue with the  current blood pressure medications.  Her brother has received some genetic testing information.  She will be sending that information along in her [place] email.  Medication Adjustments/Labs and Tests Ordered: Current medicines are reviewed at length with the patient today.  Concerns regarding medicines are outlined above.  No orders of the defined types were placed in this encounter.  No orders of the defined types were placed in this encounter.    Patient Instructions  Medication Instructions:  No changes *If you need a refill on your cardiac medications before your next appointment, please call your pharmacy*   Lab Work: NONE If you have labs (blood work) drawn today and your tests are completely normal, you will receive your results only by: Marland Kitchen MyChart Message (if you have MyChart) OR . A paper copy in the mail If you have any lab test that is abnormal or we need to change your treatment, we will call you to review the results.   Testing/Procedures: ECHO IN A YEAR   Follow-Up: At [facility], you and your health needs are our priority.  As part of our continuing mission to provide you with exceptional heart care, we have created designated Provider Care Teams.  These Care Teams include your primary Cardiologist (physician) and Advanced Practice Providers (APPs -  Physician Assistants and Nurse Practitioners) who all work together to provide you with the care you need, when you need it.  We recommend signing up for the patient portal called "MyChart".  Sign up  information is provided on this After Visit Summary.  MyChart is used to connect with patients for Virtual Visits (Telemedicine).  Patients are able to view lab/test results, encounter notes, upcoming appointments, etc.  Non-urgent messages can be sent to your provider as well.   To learn more about what you can do with MyChart, go to [email or web address].    Your next appointment:  1 year(s)  The format for your next appointment:   In Person  Provider:   Mertie Moores, MD or Dorris Carnes, MD   Other Instructions Your physician has requested that you have an echocardiogram. Echocardiography is a painless test that uses sound waves to create images of your heart. It provides your doctor with information about the size and shape of your heart and how well your heart's chambers and valves are working. This procedure takes approximately one hour. There are no restrictions for this procedure. IN A YEAR MILD DILATED AORTA      Signed, Mertie Moores, MD  12/11/2019 3:40 PM    [facility]

## 2019-12-11 ENCOUNTER — Other Ambulatory Visit: Payer: Self-pay

## 2019-12-11 ENCOUNTER — Encounter: Payer: Self-pay | Admitting: Cardiovascular Disease

## 2019-12-11 ENCOUNTER — Ambulatory Visit: Payer: 59 | Admitting: Cardiovascular Disease

## 2019-12-11 VITALS — BP 114/76 | HR 92 | Ht 61.0 in | Wt 172.6 lb

## 2019-12-11 DIAGNOSIS — I10 Essential (primary) hypertension: Secondary | ICD-10-CM

## 2019-12-11 DIAGNOSIS — I77819 Aortic ectasia, unspecified site: Secondary | ICD-10-CM | POA: Diagnosis not present

## 2019-12-11 DIAGNOSIS — Z8249 Family history of ischemic heart disease and other diseases of the circulatory system: Secondary | ICD-10-CM | POA: Diagnosis not present

## 2019-12-11 NOTE — Patient Instructions (Addendum)
Medication Instructions:  No changes *If you need a refill on your cardiac medications before your next appointment, please call your pharmacy*   Lab Work: NONE If you have labs (blood work) drawn today and your tests are completely normal, you will receive your results only by: Marland Kitchen MyChart Message (if you have MyChart) OR . A paper copy in the mail If you have any lab test that is abnormal or we need to change your treatment, we will call you to review the results.   Testing/Procedures: ECHO IN A YEAR   Follow-Up: At [facility], you and your health needs are our priority.  As part of our continuing mission to provide you with exceptional heart care, we have created designated Provider Care Teams.  These Care Teams include your primary Cardiologist (physician) and Advanced Practice Providers (APPs -  Physician Assistants and Nurse Practitioners) who all work together to provide you with the care you need, when you need it.  We recommend signing up for the patient portal called "MyChart".  Sign up information is provided on this After Visit Summary.  MyChart is used to connect with patients for Virtual Visits (Telemedicine).  Patients are able to view lab/test results, encounter notes, upcoming appointments, etc.  Non-urgent messages can be sent to your provider as well.   To learn more about what you can do with MyChart, go to [email or web address].    Your next appointment:  1 year(s)  The format for your next appointment:   In Person  Provider:   DR [facility]   Other Instructions Your physician has requested that you have an echocardiogram. Echocardiography is a painless test that uses sound waves to create images of your heart. It provides your doctor with information about the size and shape of your heart and how well your heart's chambers and valves are working. This procedure takes approximately one hour. There are no restrictions for this procedure. IN A YEAR MILD DILATED  AORTA

## 2019-12-17 ENCOUNTER — Ambulatory Visit (INDEPENDENT_AMBULATORY_CARE_PROVIDER_SITE_OTHER): Payer: 59 | Admitting: Internal Medicine

## 2019-12-17 ENCOUNTER — Encounter: Payer: Self-pay | Admitting: Internal Medicine

## 2019-12-17 ENCOUNTER — Other Ambulatory Visit: Payer: Self-pay

## 2019-12-17 VITALS — BP 128/84 | HR 104 | Temp 98.0°F | Ht 61.0 in | Wt 169.0 lb

## 2019-12-17 DIAGNOSIS — Z23 Encounter for immunization: Secondary | ICD-10-CM | POA: Diagnosis not present

## 2019-12-17 DIAGNOSIS — E559 Vitamin D deficiency, unspecified: Secondary | ICD-10-CM | POA: Diagnosis not present

## 2019-12-17 DIAGNOSIS — E538 Deficiency of other specified B group vitamins: Secondary | ICD-10-CM

## 2019-12-17 DIAGNOSIS — Z0000 Encounter for general adult medical examination without abnormal findings: Secondary | ICD-10-CM | POA: Diagnosis not present

## 2019-12-17 NOTE — Patient Instructions (Signed)

## 2019-12-17 NOTE — Progress Notes (Addendum)
Subjective:    Patient ID: Tamara [name], female    DOB: Apr 11, 1960, 59 y.o.   MRN: [id number]  HPI Here for wellness and f/u;  Overall doing ok;  Pt denies Chest pain, worsening SOB, DOE, wheezing, orthopnea, PND, worsening LE edema, palpitations, dizziness or syncope.  Pt denies neurological change such as new headache, facial or extremity weakness.  Pt denies polydipsia, polyuria, or low sugar symptoms. Pt states overall good compliance with treatment and medications, good tolerability, and has been trying to follow appropriate diet.  Pt denies worsening depressive symptoms, suicidal ideation or panic. No fever, night sweats, wt loss, loss of appetite, or other constitutional symptoms.  Pt states good ability with ADL's, has low fall risk, home safety reviewed and adequate, no other significant changes in hearing or vision, and only occasionally active with exercise.  No new compaints Past Medical History:  Diagnosis Date  . Allergic rhinitis, mild   . Anemia   . Depression   . Hypertension   . Ovarian cyst    Past Surgical History:  Procedure Laterality Date  . LAMINECTOMY    . LUMBAR EPIDURAL INJECTION  11/03/2014    reports that she has never smoked. She has never used smokeless tobacco. She reports current alcohol use of about 3.0 standard drinks of alcohol per week. She reports that she does not use drugs. family history includes Alcohol abuse in her father; Depression in her father and mother. Allergies  Allergen Reactions  . Gabapentin Swelling  . Quinolones Other (See Comments)  . Ciprofloxacin Other (See Comments)    Hx of asc. Aortic dilitation.   Family hx of Marfans   Current Outpatient Medications on File Prior to Visit  Medication Sig Dispense Refill  . ALPRAZolam (XANAX) 0.5 MG tablet TAKE 1 TABLET(0.5 MG) BY MOUTH THREE TIMES DAILY AS NEEDED FOR ANXIETY 90 tablet 2  . cetirizine (ZYRTEC) 10 MG tablet Take 1 tablet by mouth daily.    . hydrOXYzine  (ATARAX/VISTARIL) 10 MG tablet TAKE 1 TABLET(10 MG) BY MOUTH EVERY 8 HOURS AS NEEDED 90 tablet 3  . levothyroxine (SYNTHROID) 50 MCG tablet TAKE 1 TABLET(50 MCG) BY MOUTH DAILY 30 tablet 3  . losartan (COZAAR) 100 MG tablet Take 1 tablet (100 mg total) by mouth daily. 90 tablet 3  . meloxicam (MOBIC) 15 MG tablet TAKE 1 TABLET(15 MG) BY MOUTH DAILY 90 tablet 0  . venlafaxine XR (EFFEXOR-XR) 75 MG 24 hr capsule TAKE 1 CAPSULE(75 MG) BY MOUTH DAILY WITH BREAKFAST 90 capsule 3   No current facility-administered medications on file prior to visit.   Review of Systems All otherwise neg per pt     Objective:   Physical Exam BP 128/84   Pulse (!) 104   Temp 98 F (36.7 C) (Oral)   Ht 5\' 1"  (1.549 m)   Wt 169 lb (76.7 kg)   LMP 06/28/2016 (Approximate)   SpO2 99%   BMI 31.93 kg/m  VS noted,  Constitutional: Pt appears in NAD HENT: Head: NCAT.  Right Ear: External ear normal.  Left Ear: External ear normal.  Eyes: . Pupils are equal, round, and reactive to light. Conjunctivae and EOM are normal Nose: without d/c or deformity Neck: Neck supple. Gross normal ROM Cardiovascular: Normal rate and regular rhythm.   Pulmonary/Chest: Effort normal and breath sounds without rales or wheezing.  Abd:  Soft, NT, ND, + BS, no organomegaly Neurological: Pt is alert. At baseline orientation, motor grossly intact Skin: Skin is warm.  No rashes, other new lesions, no LE edema Psychiatric: Pt behavior is normal without agitation  All otherwise neg per pt Lab Results  Component Value Date   WBC 6.3 11/12/2018   HGB 14.5 11/12/2018   HCT 42.8 11/12/2018   PLT 304.0 11/12/2018   GLUCOSE 79 09/03/2019   CHOL 188 11/12/2018   TRIG 147.0 11/12/2018   HDL 92.00 11/12/2018   LDLCALC 66 11/12/2018   ALT 17 11/12/2018   AST 20 11/12/2018   NA 140 09/03/2019   K 4.8 09/03/2019   CL 105 09/03/2019   CREATININE 0.86 09/03/2019   BUN 15 09/03/2019   CO2 21 09/03/2019   TSH 0.91 05/27/2019         Assessment & Plan:

## 2019-12-17 NOTE — Assessment & Plan Note (Signed)

## 2019-12-18 ENCOUNTER — Encounter: Payer: Self-pay | Admitting: Internal Medicine

## 2019-12-30 ENCOUNTER — Other Ambulatory Visit: Payer: Self-pay | Admitting: Internal Medicine

## 2020-01-13 ENCOUNTER — Encounter: Payer: Self-pay | Admitting: Family

## 2020-01-13 ENCOUNTER — Other Ambulatory Visit: Payer: Self-pay

## 2020-01-13 ENCOUNTER — Ambulatory Visit (INDEPENDENT_AMBULATORY_CARE_PROVIDER_SITE_OTHER): Payer: 59 | Admitting: Family

## 2020-01-13 VITALS — BP 122/76 | HR 100 | Temp 97.9°F | Ht 61.0 in | Wt 170.0 lb

## 2020-01-13 DIAGNOSIS — H1032 Unspecified acute conjunctivitis, left eye: Secondary | ICD-10-CM

## 2020-01-13 DIAGNOSIS — E538 Deficiency of other specified B group vitamins: Secondary | ICD-10-CM | POA: Diagnosis not present

## 2020-01-13 DIAGNOSIS — E559 Vitamin D deficiency, unspecified: Secondary | ICD-10-CM | POA: Diagnosis not present

## 2020-01-13 DIAGNOSIS — Z0000 Encounter for general adult medical examination without abnormal findings: Secondary | ICD-10-CM

## 2020-01-13 LAB — LIPID PANEL
Cholesterol: 176 mg/dL (ref 0–200)
HDL: 65.1 mg/dL (ref >39.00)
NonHDL: 111.36
Total CHOL/HDL Ratio: 3
Triglycerides: 217 mg/dL — ABNORMAL HIGH (ref 0.0–149.0)
VLDL: 43.4 mg/dL — ABNORMAL HIGH (ref 0.0–40.0)

## 2020-01-13 LAB — VITAMIN D 25 HYDROXY (VIT D DEFICIENCY, FRACTURES): VITD: 22.87 ng/mL — ABNORMAL LOW (ref 30.00–100.00)

## 2020-01-13 LAB — CBC WITH DIFFERENTIAL/PLATELET
Basophils Absolute: 0 10*3/uL (ref 0.0–0.1)
Basophils Relative: 0.4 % (ref 0.0–3.0)
Eosinophils Absolute: 0 10*3/uL (ref 0.0–0.7)
Eosinophils Relative: 0 % (ref 0.0–5.0)
HCT: 40.8 % (ref 36.0–46.0)
Hemoglobin: 13.9 g/dL (ref 12.0–15.0)
Lymphocytes Relative: 41.5 % (ref 12.0–46.0)
Lymphs Abs: 2.1 10*3/uL (ref 0.7–4.0)
MCHC: 34.2 g/dL (ref 30.0–36.0)
MCV: 95.2 fl (ref 78.0–100.0)
Monocytes Absolute: 0.6 10*3/uL (ref 0.1–1.0)
Monocytes Relative: 12 % (ref 3.0–12.0)
Neutro Abs: 2.3 10*3/uL (ref 1.4–7.7)
Neutrophils Relative %: 46.1 % (ref 43.0–77.0)
Platelets: 292 10*3/uL (ref 150.0–400.0)
RBC: 4.28 Mil/uL (ref 3.87–5.11)
RDW: 12.3 % (ref 11.5–15.5)
WBC: 5.1 10*3/uL (ref 4.0–10.5)

## 2020-01-13 LAB — VITAMIN B12: Vitamin B-12: 129 pg/mL — ABNORMAL LOW (ref 211–911)

## 2020-01-13 LAB — HEPATIC FUNCTION PANEL
ALT: 16 U/L (ref 0–35)
AST: 22 U/L (ref 0–37)
Albumin: 4.3 g/dL (ref 3.5–5.2)
Alkaline Phosphatase: 70 U/L (ref 39–117)
Bilirubin, Direct: 0.1 mg/dL (ref 0.0–0.3)
Total Bilirubin: 0.5 mg/dL (ref 0.2–1.2)
Total Protein: 6.9 g/dL (ref 6.0–8.3)

## 2020-01-13 LAB — LDL CHOLESTEROL, DIRECT: Direct LDL: 89 mg/dL

## 2020-01-13 LAB — TSH: TSH: 1.92 u[IU]/mL (ref 0.35–4.50)

## 2020-01-13 MED ORDER — TOBRAMYCIN 0.3 % OP SOLN: 1.0000 [drp] | Freq: Four times a day (QID) | OPHTHALMIC | 0 refills | Status: DC

## 2020-01-13 NOTE — Patient Instructions (Signed)
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
## 2020-01-14 LAB — URINALYSIS, ROUTINE W REFLEX MICROSCOPIC
Bilirubin Urine: NEGATIVE
Hgb urine dipstick: NEGATIVE
Ketones, ur: NEGATIVE
Leukocytes,Ua: NEGATIVE
Nitrite: NEGATIVE
RBC / HPF: NONE SEEN (ref 0–?)
Specific Gravity, Urine: 1.025 (ref 1.000–1.030)
Total Protein, Urine: NEGATIVE
Urine Glucose: NEGATIVE
Urobilinogen, UA: 0.2 (ref 0.0–1.0)
WBC, UA: NONE SEEN (ref 0–?)
pH: 6.5 (ref 5.0–8.0)

## 2020-01-15 ENCOUNTER — Encounter: Payer: Self-pay | Admitting: Internal Medicine

## 2020-01-15 ENCOUNTER — Other Ambulatory Visit: Payer: Self-pay | Admitting: Internal Medicine

## 2020-01-15 ENCOUNTER — Telehealth: Payer: Self-pay | Admitting: Internal Medicine

## 2020-01-15 DIAGNOSIS — H1032 Unspecified acute conjunctivitis, left eye: Secondary | ICD-10-CM

## 2020-01-15 MED ORDER — VITAMIN B-12 1000 MCG PO TABS: 1000.0000 ug | ORAL_TABLET | Freq: Every day | ORAL | 3 refills | Status: DC

## 2020-01-15 MED ORDER — THERA-D 2000 50 MCG (2000 UT) PO TABS: ORAL_TABLET | ORAL | 3 refills | Status: AC

## 2020-01-15 NOTE — Telephone Encounter (Signed)
Ok this is done 

## 2020-01-15 NOTE — Telephone Encounter (Signed)
    Patient calling to request referral to Ophthalmologist, she states since her visit on 11/29 her eye is not responding to eye drops

## 2020-01-25 ENCOUNTER — Other Ambulatory Visit: Payer: Self-pay | Admitting: Family Medicine

## 2020-01-28 ENCOUNTER — Other Ambulatory Visit: Payer: Self-pay | Admitting: Internal Medicine

## 2020-01-28 ENCOUNTER — Other Ambulatory Visit: Payer: Self-pay | Admitting: Family Medicine

## 2020-01-28 ENCOUNTER — Telehealth: Payer: Self-pay | Admitting: Internal Medicine

## 2020-01-28 NOTE — Telephone Encounter (Signed)
Done erx 

## 2020-01-28 NOTE — Telephone Encounter (Signed)
1.Medication Requested: ALPRAZolam (XANAX) 0.5 MG tablet    2. Pharmacy (Name, Street, [place]): [facility] [phone] - [place], [place] - [address]  3. On Med List: yes   4. Last Visit with PCP: 11.2.2021  5. Next visit date with PCP: 11.4.2022   Agent: Please be advised that RX refills may take up to 3 business days. We ask that you follow-up with your pharmacy.

## 2020-02-05 ENCOUNTER — Ambulatory Visit (INDEPENDENT_AMBULATORY_CARE_PROVIDER_SITE_OTHER): Payer: 59 | Admitting: Family Medicine

## 2020-02-05 ENCOUNTER — Encounter: Payer: Self-pay | Admitting: Family Medicine

## 2020-02-05 ENCOUNTER — Other Ambulatory Visit: Payer: Self-pay

## 2020-02-05 VITALS — BP 140/90 | HR 100 | Ht 61.0 in | Wt 170.0 lb

## 2020-02-05 DIAGNOSIS — R635 Abnormal weight gain: Secondary | ICD-10-CM | POA: Diagnosis not present

## 2020-02-05 DIAGNOSIS — M180 Bilateral primary osteoarthritis of first carpometacarpal joints: Secondary | ICD-10-CM | POA: Diagnosis not present

## 2020-02-05 DIAGNOSIS — G8929 Other chronic pain: Secondary | ICD-10-CM

## 2020-02-05 DIAGNOSIS — M48061 Spinal stenosis, lumbar region without neurogenic claudication: Secondary | ICD-10-CM

## 2020-02-05 DIAGNOSIS — M999 Biomechanical lesion, unspecified: Secondary | ICD-10-CM

## 2020-02-05 DIAGNOSIS — M25512 Pain in left shoulder: Secondary | ICD-10-CM

## 2020-02-05 DIAGNOSIS — I77819 Aortic ectasia, unspecified site: Secondary | ICD-10-CM

## 2020-02-05 DIAGNOSIS — M25511 Pain in right shoulder: Secondary | ICD-10-CM

## 2020-02-05 NOTE — Progress Notes (Signed)
Tamara [name] [address] [place] [address] Phone: [phone] Subjective:   I Tamara [name] am serving as a Neurosurgeon for Tamara [name].  This visit occurred during the SARS-CoV-2 public health emergency.  Safety protocols were in place, including screening questions prior to the visit, additional usage of staff PPE, and extensive cleaning of exam room while observing appropriate contact time as indicated for disinfecting solutions.   I'm seeing this patient by the request  of:  Tamara [name]  CC: neck pain, lumabr pain, other pain.   [id number]  Tamara [name] is a 59 y.o. female coming in with complaint of back and neck pain. OMT 10/09/2019. Patient states she had a bad episode about a 1.5 months ago. Was on concrete for 2 days. States she felt symptoms for a herniated disc. States she knows the right hand needs surgery. The cold weather is causing hand pain bilaterally. Right shoulder pain has increased. Had some recent blood work she wants talk about. Wants weight counseling. Lots of stress as well in life with job and family likely contributing    Medications patient has been prescribed: Hydroxizine, Mobic  Taking:yes          Reviewed prior external information including notes and imaging from previsou exam, outside providers and external EMR if available.   As well as notes that were available from care everywhere and other healthcare systems.  Past medical history, social, surgical and family history all reviewed in electronic medical record.  No pertanent information unless stated regarding to the chief complaint.   Past Medical History:  Diagnosis Date  . Allergic rhinitis, mild   . Anemia   . Depression   . Hypertension   . Ovarian cyst     Allergies  Allergen Reactions  . Gabapentin Swelling  . Quinolones Other (See Comments)  . Ciprofloxacin Other (See Comments)    Hx of asc. Aortic dilitation.   Family hx  of Marfans     Review of Systems:  No headache, visual changes, nausea, vomiting, diarrhea, constipation, dizziness, abdominal pain, skin rash, fevers, chills, night sweats, weight loss, swollen lymph nodes, joint swelling, chest pain, shortness of breath, mood changes. POSITIVE muscle aches, body aches   Objective  Blood pressure 140/90, pulse 100, height 5\' 1"  (1.549 m), weight 170 lb (77.1 kg), last menstrual period 06/28/2016, SpO2 98 %.   General: No apparent distress alert and oriented x3 mood and affect normal, dressed appropriately.  HEENT: Pupils equal, extraocular movements intact  Respiratory: Patient's speak in full sentences and does not appear short of breath  Cardiovascular: No lower extremity edema, non tender, no erythema  Gait normal with good balance and coordination.  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  Left shoulder positive impingement noted. Mild decrease in internal and external range of motion rotator cuff is intact  Low back tightness noted in all planes. Tightness with Pearlean Brownie test, still though does have mild hypermobility noted of multiple joints.  Negative SLT but mild worsening with extension of the back.   Osteopathic findings  C6 flexed rotated and side bent left T7 extended rotated and side bent left L2 flexed rotated and side bent right Sacrum right on right     Assessment and Plan:   Stenosis of lateral recess of lumbar spine Patient has had surgery previously and wants to avoid it again.  We may  need to consider further work-up again and consider the epidurals or nerve root injections.  Patient did respond fairly well to osteopathic manipulation today.  Discussed posture and ergonomics.  I do believe that patient has some underlying stress that is also contributing.  Follow-up with me again in 4 to 8 weeks  CMC arthritis, thumb, degenerative Discussed with patient again at this time.   Patient wants to avoid surgery but knows she will be having on the right side at some point.  Does not want to try another injection  Aortic dilatation [facility]) Encourage patient's last ultrasound was in 2017 and will be following up with her cardiologist in January.  Patient knows of red flags and when to seek medical attention  Bilateral shoulder pain Has had shoulder pain bilaterally previsouly, ac arthropathy noted on the right, will get xrays. Due to patient underlying arthritis and ? Of CTD with strong family history likely contributing. Has done relatively well for years but if continues may need further work up    [place] problems  Decision today to treat with OMT was based on Physical Exam  After verbal consent patient was treated with HVLA, ME, FPR techniques in cervical,  thoracic, lumbar, and sacral  areas  Patient tolerated the procedure well with improvement in symptoms  Patient given exercises, stretches and lifestyle modifications  See medications in patient instructions if given  Patient will follow up in 4-8 weeks      The above documentation has been reviewed and is accurate and complete Tamara Pulley, DO       Note: This dictation was prepared with Dragon dictation along with smaller phrase technology. Any transcriptional errors that result from this process are unintentional.

## 2020-02-05 NOTE — Assessment & Plan Note (Signed)
Discussed with patient again at this time.  Patient wants to avoid surgery but knows she will be having on the right side at some point.  Does not want to try another injection

## 2020-02-05 NOTE — Assessment & Plan Note (Signed)
Encourage patient's last ultrasound was in 2017 and will be following up with her cardiologist in January.  Patient knows of red flags and when to seek medical attention

## 2020-02-05 NOTE — Patient Instructions (Signed)
Good to see you Thank you for the peanuts  Have cardiologist order ultrasound I think you are having a flare See me again in 4-6 weeks

## 2020-02-05 NOTE — Assessment & Plan Note (Signed)
Patient has had surgery previously and wants to avoid it again.  We may need to consider further work-up again and consider the epidurals or nerve root injections.  Patient did respond fairly well to osteopathic manipulation today.  Discussed posture and ergonomics.  I do believe that patient has some underlying stress that is also contributing.  Follow-up with me again in 4 to 8 weeks

## 2020-02-06 ENCOUNTER — Encounter: Payer: Self-pay | Admitting: Family Medicine

## 2020-02-06 ENCOUNTER — Other Ambulatory Visit: Payer: Self-pay

## 2020-02-06 DIAGNOSIS — M25512 Pain in left shoulder: Secondary | ICD-10-CM

## 2020-02-06 NOTE — Assessment & Plan Note (Signed)
Has had shoulder pain bilaterally previsouly, ac arthropathy noted on the right, will get xrays. Due to patient underlying arthritis and ? Of CTD with strong family history likely contributing. Has done relatively well for years but if continues may need further work up

## 2020-02-06 NOTE — Progress Notes (Signed)
Left a voicemail for patient to get xrays.

## 2020-02-10 ENCOUNTER — Ambulatory Visit (INDEPENDENT_AMBULATORY_CARE_PROVIDER_SITE_OTHER): Payer: 59

## 2020-02-10 DIAGNOSIS — G8929 Other chronic pain: Secondary | ICD-10-CM

## 2020-02-10 DIAGNOSIS — M25512 Pain in left shoulder: Secondary | ICD-10-CM | POA: Diagnosis not present

## 2020-02-10 DIAGNOSIS — M25511 Pain in right shoulder: Secondary | ICD-10-CM

## 2020-02-22 ENCOUNTER — Other Ambulatory Visit: Payer: Self-pay | Admitting: Family Medicine

## 2020-03-03 NOTE — Progress Notes (Signed)
[place] Phone: [phone] Subjective:   [name], am serving as a scribe for Dr. Hulan Saas. This visit occurred during the SARS-CoV-2 public health emergency.  Safety protocols were in place, including screening questions prior to the visit, additional usage of staff PPE, and extensive cleaning of exam room while observing appropriate contact time as indicated for disinfecting solutions.   I'm seeing this patient by the request  of:  [name], MD  CC: neck and back pain follow up   [id number]   02/05/2020 Encourage patient's last ultrasound was in 2017 and will be following up with her cardiologist in January.  Patient knows of red flags and when to seek medical attention  Has had shoulder pain bilaterally previsouly, ac arthropathy noted on the right, will get xrays. Due to patient underlying arthritis and ? Of CTD with strong family history likely contributing. Has done relatively well for years but if continues may need further work up   Discussed with patient again at this time.  Patient wants to avoid surgery but knows she will be having on the right side at some point.  Does not want to try another injection  Patient has had surgery previously and wants to avoid it again.  We may need to consider further work-up again and consider the epidurals or nerve root injections.  Patient did respond fairly well to osteopathic manipulation today.  Discussed posture and ergonomics.  I do believe that patient has some underlying stress that is also contributing.  Follow-up with me again in 4 to 8 weeks  Update 03/04/2020 Tamara [name] is a 60 y.o. female coming in with complaint of back and neck pain. Patient states that she continues to have pain in anterior aspect of right shoulder. Uses meloxicam daily but does not notice a difference. Feels that she is limited in abduction and extension. Back pain  has improved but does have daily stiffness. Denies any radiating symptoms.   Patient also notes falling on her right knee over Christmas break while walking across a stream in [place]. States that it continues to bother her with stair climbing but overall feels better. Does have healing abrasion over superior patella.    Xrays of bilateral shoulder showed mild OA on the right but otherwise normal          Reviewed prior external information including notes and imaging from previsou exam, outside providers and external EMR if available.   As well as notes that were available from care everywhere and other healthcare systems.  Past medical history, social, surgical and family history all reviewed in electronic medical record.  No pertanent information unless stated regarding to the chief complaint.   Past Medical History:  Diagnosis Date  . Allergic rhinitis, mild   . Anemia   . Depression   . Hypertension   . Ovarian cyst     Allergies  Allergen Reactions  . Gabapentin Swelling  . Quinolones Other (See Comments)  . Ciprofloxacin Other (See Comments)    Hx of asc. Aortic dilitation.   Family hx of Marfans     Review of Systems:  No headache, visual changes, nausea, vomiting, diarrhea, constipation, dizziness, abdominal pain, skin rash, fevers, chills, night sweats, weight loss, swollen lymph nodes, body aches, joint swelling, chest pain, shortness of breath, mood changes. POSITIVE muscle aches  Objective  Blood pressure 118/90, pulse 91, height 5\' 1"  (1.549 m), weight 169  lb (76.7 kg), last menstrual period 06/28/2016, SpO2 98 %.   General: No apparent distress alert and oriented x3 mood and affect normal, dressed appropriately.  HEENT: Pupils equal, extraocular movements intact  Respiratory: Patient's speak in full sentences and does not appear short of breath  Cardiovascular: No lower extremity edema, non tender, no erythema  Right shoulder exam shows that patient does  actually have some mild weakness of the rotator cuff which is new.  4 out of 5 strength noted.  Positive crossover with tenderness over the acromioclavicular joint.  Patient does have positive impingement with Neer and Hawkins.  Seems worse than previous exam  Osteopathic findings  C5 flexed, rotated and sidebent right  T3 extended rotated and side bent right L2 flexed rotated and side bent right Sacrum right on right       Assessment and Plan: Arthritis of right acromioclavicular joint We discussed that his right shoulder some time.  The patient does have mild to moderate arthritic changes of the acromioclavicular joint patient on testing today does have some new onset of some mild weakness.  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  Discussed with patient about icing regimen, home exercises, which activities to doing which wants to avoid.  Patient will have an MR arthrogram done to further evaluate the rotator cuff and the labrum.  Patient has failed all other conservative therapy at this time including injections, home exercises, topical anti-inflammatories.  Oral anti-inflammatories.  Depending on findings we can discuss the potential for continued conservative management, PRP or the potential for need for surgical intervention.  Patient is in agreement with the plan and will follow-up with me again after imaging.     Nonallopathic problems  Decision today to treat with OMT was based on Physical Exam  After verbal consent patient was treated with ME, FPR techniques in cervical, , thoracic, lumbar, and sacral  areas  Patient tolerated the procedure well with improvement in symptoms  Patient given exercises, stretches and lifestyle modifications  See medications in patient instructions if given  Patient will follow up in 4-8 weeks      The above documentation has been reviewed and is accurate and complete [name], DO       Note: This  dictation was prepared with Dragon dictation along with smaller phrase technology. Any transcriptional errors that result from this process are unintentional.

## 2020-03-04 ENCOUNTER — Encounter: Payer: Self-pay | Admitting: Family Medicine

## 2020-03-04 ENCOUNTER — Ambulatory Visit: Payer: Self-pay

## 2020-03-04 ENCOUNTER — Other Ambulatory Visit: Payer: Self-pay

## 2020-03-04 ENCOUNTER — Ambulatory Visit: Payer: 59 | Admitting: Family Medicine

## 2020-03-04 VITALS — BP 118/90 | HR 91 | Ht 61.0 in | Wt 169.0 lb

## 2020-03-04 DIAGNOSIS — M19011 Primary osteoarthritis, right shoulder: Secondary | ICD-10-CM | POA: Diagnosis not present

## 2020-03-04 DIAGNOSIS — M999 Biomechanical lesion, unspecified: Secondary | ICD-10-CM

## 2020-03-04 DIAGNOSIS — M25511 Pain in right shoulder: Secondary | ICD-10-CM | POA: Diagnosis not present

## 2020-03-04 DIAGNOSIS — G8929 Other chronic pain: Secondary | ICD-10-CM

## 2020-03-04 DIAGNOSIS — M48061 Spinal stenosis, lumbar region without neurogenic claudication: Secondary | ICD-10-CM | POA: Diagnosis not present

## 2020-03-04 NOTE — Assessment & Plan Note (Signed)
Stable overall.  Responding fairly well to osteopathic manipulation.  This taking with more of the muscle energy in the FPR's.  No significant change in medications at the moment.  Patient does have the meloxicam but not using it as regularly.

## 2020-03-04 NOTE — Patient Instructions (Addendum)
MRA right shoulder ordered Good luck with the heater  See me again in 5-6 weeks

## 2020-03-04 NOTE — Assessment & Plan Note (Signed)
We discussed that his right shoulder some time.  The patient does have mild to moderate arthritic changes of the acromioclavicular joint patient on testing today does have some new onset of some mild weakness.  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  Discussed with patient about icing regimen, home exercises, which activities to doing which wants to avoid.  Patient will have an MR arthrogram done to further evaluate the rotator cuff and the labrum.  Patient has failed all other conservative therapy at this time including injections, home exercises, topical anti-inflammatories.  Oral anti-inflammatories.  Depending on findings we can discuss the potential for continued conservative management, PRP or the potential for need for surgical intervention.  Patient is in agreement with the plan and will follow-up with me again after imaging.

## 2020-03-05 ENCOUNTER — Encounter: Payer: Self-pay | Admitting: Family Medicine

## 2020-03-18 ENCOUNTER — Encounter: Payer: Self-pay | Admitting: Family Medicine

## 2020-03-18 ENCOUNTER — Other Ambulatory Visit: Payer: Self-pay

## 2020-03-18 DIAGNOSIS — M545 Low back pain, unspecified: Secondary | ICD-10-CM

## 2020-03-19 ENCOUNTER — Ambulatory Visit (INDEPENDENT_AMBULATORY_CARE_PROVIDER_SITE_OTHER): Payer: 59

## 2020-03-19 DIAGNOSIS — M545 Low back pain, unspecified: Secondary | ICD-10-CM | POA: Diagnosis not present

## 2020-03-20 ENCOUNTER — Encounter: Payer: Self-pay | Admitting: Family Medicine

## 2020-03-23 ENCOUNTER — Other Ambulatory Visit: Payer: Self-pay

## 2020-03-23 ENCOUNTER — Other Ambulatory Visit: Payer: Self-pay | Admitting: Family Medicine

## 2020-03-23 MED ORDER — HYDROXYZINE HCL 10 MG PO TABS: ORAL_TABLET | ORAL | 0 refills | Status: DC

## 2020-03-26 ENCOUNTER — Other Ambulatory Visit: Payer: Self-pay

## 2020-03-26 ENCOUNTER — Ambulatory Visit
Admission: RE | Admit: 2020-03-26 | Discharge: 2020-03-26 | Disposition: A | Discharge status: Home / Self Care | Payer: 59 | Source: Ambulatory Visit | Attending: Family Medicine | Admitting: Family Medicine

## 2020-03-26 DIAGNOSIS — G8929 Other chronic pain: Secondary | ICD-10-CM

## 2020-03-26 DIAGNOSIS — M25511 Pain in right shoulder: Secondary | ICD-10-CM

## 2020-03-26 MED ORDER — IOPAMIDOL (ISOVUE-M 200) INJECTION 41%
12.0000 mL | Freq: Once | INTRAMUSCULAR | Status: AC
  Administered 2020-03-26: 12 mL via INTRA_ARTICULAR

## 2020-03-28 ENCOUNTER — Encounter: Payer: Self-pay | Admitting: Family Medicine

## 2020-03-30 ENCOUNTER — Other Ambulatory Visit: Payer: Self-pay | Admitting: Obstetrics and Gynecology

## 2020-03-30 DIAGNOSIS — Z1231 Encounter for screening mammogram for malignant neoplasm of breast: Secondary | ICD-10-CM

## 2020-03-31 NOTE — Progress Notes (Signed)
[name] Phone: [phone] Subjective:   [name], am serving as a scribe for Dr. Hulan Saas. This visit occurred during the SARS-CoV-2 public health emergency.  Safety protocols were in place, including screening questions prior to the visit, additional usage of staff PPE, and extensive cleaning of exam room while observing appropriate contact time as indicated for disinfecting solutions.   I'm seeing this patient by the request  of:  [name], MD  CC: Right shoulder and back pain follow-up  [id number]   03/04/2020 We discussed that his right shoulder some time.  The patient does have mild to moderate arthritic changes of the acromioclavicular joint patient on testing today does have some new onset of some mild weakness.  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  Discussed with patient about icing regimen, home exercises, which activities to doing which wants to avoid.  Patient will have an MR arthrogram done to further evaluate the rotator cuff and the labrum.  Patient has failed all other conservative therapy at this time including injections, home exercises, topical anti-inflammatories.  Oral anti-inflammatories.  Depending on findings we can discuss the potential for continued conservative management, PRP or the potential for need for surgical intervention.  Patient is in agreement with the plan and will follow-up with me again after imaging.  Update 04/01/2020 Tamara [name] is a 60 y.o. female coming in with complaint of right, AC joint arthritis. Patient states that her shoulder pain has improved. Patient has started back on Meloxicam. Pain increases with flexion.    MRI right shoulder 03/26/2020 IMPRESSION: Low-grade partial articular surface tear seen of the superior subscapularis tendon.  Mild to moderate supraspinatus, subscapularis, and  infraspinatus tendinosis  Mild glenohumeral joint chondral disease and anterior superior labral degeneration  Moderate AC joint arthrosis with subacromial-subdeltoid bursitis.   Shoulder MRI- 03/27/20- Low grade partial tear of subscapularis  Moderate tendinosis of entire rotator cuff  Moderate AC arthritis with mild bursitis   Lumbar spine  Xray 03/19/2020  IMPRESSION: Lumbosacral spondylosis, without acute osseous abnormality.  Past Medical History:  Diagnosis Date  . Allergic rhinitis, mild   . Anemia   . Depression   . Hypertension   . Ovarian cyst    Past Surgical History:  Procedure Laterality Date  . LAMINECTOMY    . LUMBAR EPIDURAL INJECTION  11/03/2014   Social History   Socioeconomic History  . Marital status: Married    Spouse name: Not on file  . Number of children: 0  . Years of education: Not on file  . Highest education level: Not on file  Occupational History  . Not on file  Tobacco Use  . Smoking status: Never Smoker  . Smokeless tobacco: Never Used  Vaping Use  . Vaping Use: Never used  Substance and Sexual Activity  . Alcohol use: Yes    Alcohol/week: 3.0 standard drinks    Types: 3 Standard drinks or equivalent per week  . Drug use: No  . Sexual activity: Yes    Partners: Male    Birth control/protection: I.U.D.  Other Topics Concern  . Not on file  Social History Narrative  . Not on file   Social Determinants of Health   Financial Resource Strain: Not on file  Food Insecurity: Not on file  Transportation Needs: Not on file  Physical Activity: Not on file  Stress: Not on file  Social Connections: Not  on file   Allergies  Allergen Reactions  . Gabapentin Swelling  . Quinolones Other (See Comments)  . Ciprofloxacin Other (See Comments)    Hx of asc. Aortic dilitation.   Family hx of Marfans   Family History  Problem Relation Age of Onset  . Depression Mother   . Depression Father   . Alcohol abuse Father     Current  Outpatient Medications (Endocrine & Metabolic):  .  levothyroxine (SYNTHROID) 50 MCG tablet, TAKE 1 TABLET(50 MCG) BY MOUTH DAILY  Current Outpatient Medications (Cardiovascular):  .  losartan (COZAAR) 100 MG tablet, Take 1 tablet (100 mg total) by mouth daily.  Current Outpatient Medications (Respiratory):  .  cetirizine (ZYRTEC) 10 MG tablet, Take 1 tablet by mouth daily.  Current Outpatient Medications (Analgesics):  .  meloxicam (MOBIC) 15 MG tablet, TAKE 1 TABLET(15 MG) BY MOUTH DAILY  Current Outpatient Medications (Hematological):  .  vitamin B-12 (CYANOCOBALAMIN) 1000 MCG tablet, Take 1 tablet (1,000 mcg total) by mouth daily.  Current Outpatient Medications (Other):  Marland Kitchen  ALPRAZolam (XANAX) 0.5 MG tablet, TAKE 1 TABLET(0.5 MG) BY MOUTH THREE TIMES DAILY AS NEEDED FOR ANXIETY .  Cholecalciferol (THERA-D 2000) 50 MCG (2000 UT) TABS, 1 tab by mouth once daily .  hydrOXYzine (ATARAX/VISTARIL) 10 MG tablet, TAKE 1 TABLET(10 MG) BY MOUTH EVERY 8 HOURS AS NEEDED .  tobramycin (TOBREX) 0.3 % ophthalmic solution, Place 1 drop into the left eye every 6 (six) hours. Marland Kitchen  venlafaxine XR (EFFEXOR-XR) 75 MG 24 hr capsule, TAKE ONE CAPSULE BY MOUTH DAILY WITH BREAKFAST   Reviewed prior external information including notes and imaging from  primary care provider As well as notes that were available from care everywhere and other healthcare systems.  Past medical history, social, surgical and family history all reviewed in electronic medical record.  No pertanent information unless stated regarding to the chief complaint.   Review of Systems:  No headache, visual changes, nausea, vomiting, diarrhea, constipation, dizziness, abdominal pain, skin rash, fevers, chills, night sweats, weight loss, swollen lymph nodes, body aches, joint swelling, chest pain, shortness of breath, mood changes. POSITIVE muscle aches  Objective  Blood pressure (!) 132/98, pulse (!) 101, height 5\' 1"  (1.549 m), weight 170  lb (77.1 kg), last menstrual period 06/28/2016, SpO2 98 %.   General: No apparent distress alert and oriented x3 mood and affect normal, dressed appropriately.  HEENT: Pupils equal, extraocular movements intact  Respiratory: Patient's speak in full sentences and does not appear short of breath  Cardiovascular: No lower extremity edema, non tender, no erythema  Gait normal with good balance and coordination.  MSK: Right shoulder exam does show that patient does have some difficulty still with crossover enzymes have some mild discomfort.  Patient notes rotator cuff strength does appear to be intact.  Back exam does have some loss of lordosis.  Tightness with FABER test.  Tightness with straight leg test but no radicular symptoms.  Patient does have some hypermobility of some of the other joints noted.  Osteopathic findings  C6 flexed rotated and side bent left T9 extended rotated and side bent left L2 flexed rotated and side bent right Sacrum right on right     Impression and Recommendations:     The above documentation has been reviewed and is accurate and complete [name], DO

## 2020-04-01 ENCOUNTER — Ambulatory Visit: Payer: 59 | Admitting: Family Medicine

## 2020-04-01 ENCOUNTER — Encounter: Payer: Self-pay | Admitting: Family Medicine

## 2020-04-01 ENCOUNTER — Other Ambulatory Visit: Payer: Self-pay

## 2020-04-01 VITALS — BP 132/98 | HR 101 | Ht 61.0 in | Wt 170.0 lb

## 2020-04-01 DIAGNOSIS — M999 Biomechanical lesion, unspecified: Secondary | ICD-10-CM

## 2020-04-01 DIAGNOSIS — M4722 Other spondylosis with radiculopathy, cervical region: Secondary | ICD-10-CM | POA: Diagnosis not present

## 2020-04-01 DIAGNOSIS — M19011 Primary osteoarthritis, right shoulder: Secondary | ICD-10-CM

## 2020-04-01 NOTE — Assessment & Plan Note (Signed)

## 2020-04-01 NOTE — Assessment & Plan Note (Signed)
Continues to be the main concern.  Patient states doing relatively well.  He can consider the possibility of a subacromial bursitis follow-up.  Patient wants to hold at this point and will continue with the meloxicam.  We will follow up again in 4 weeks for further evaluation and treatment.

## 2020-04-01 NOTE — Assessment & Plan Note (Signed)
Known arthritic changes but has been doing relatively well.  Discussed icing regimen and home exercises.  Discussed posture and ergonomics.  Spurling's negative.  Follow-up with me again in 4 to 6 weeks.

## 2020-04-01 NOTE — Patient Instructions (Signed)
Hold on injections-In 4 weeks will consider 2 injections in shoulder Back is doing good See you in 4 weeks

## 2020-04-22 ENCOUNTER — Other Ambulatory Visit: Payer: Self-pay | Admitting: Family Medicine

## 2020-04-30 ENCOUNTER — Other Ambulatory Visit: Payer: Self-pay

## 2020-04-30 ENCOUNTER — Encounter: Payer: Self-pay | Admitting: Family Medicine

## 2020-04-30 ENCOUNTER — Ambulatory Visit: Payer: 59 | Admitting: Family Medicine

## 2020-04-30 VITALS — BP 128/94 | HR 85 | Ht 61.0 in | Wt 167.0 lb

## 2020-04-30 DIAGNOSIS — M999 Biomechanical lesion, unspecified: Secondary | ICD-10-CM | POA: Diagnosis not present

## 2020-04-30 DIAGNOSIS — M5416 Radiculopathy, lumbar region: Secondary | ICD-10-CM

## 2020-04-30 NOTE — Patient Instructions (Signed)
Good to see you Best i've seen you! See me in 6-8 weeks

## 2020-04-30 NOTE — Progress Notes (Signed)
[place] Phone: [phone] Subjective:   [name], am serving as a scribe for Dr. Hulan Saas. This visit occurred during the SARS-CoV-2 public health emergency.  Safety protocols were in place, including screening questions prior to the visit, additional usage of staff PPE, and extensive cleaning of exam room while observing appropriate contact time as indicated for disinfecting solutions.   I'm seeing this patient by the request  of:  [name], MD  CC: Neck, back pain and shoulder pain follow-up  [id number]  NEYLAN KOROMA is a 60 y.o. female coming in with complaint of back and neck pain. OMT 04/01/2020. Patient states that her shoulder pain has decreased.  Patient feels like it is made significant improvement at this time.  Not having as much pain and only some mild stiffness from time to time  Medications patient has been prescribed: Hydroxizine, Meloxicam  Taking: Intermittently      MRI right shoulder 03/26/2020 IMPRESSION: Low-grade partial articular surface tear seen of the superior subscapularis tendon.  Mild to moderate supraspinatus, subscapularis, and infraspinatus tendinosis  Mild glenohumeral joint chondral disease and anterior superior labral degeneration  Moderate AC joint arthrosis with subacromial-subdeltoid bursitis.   Shoulder MRI- 03/27/20- Low grade partial tear of subscapularis  Moderate tendinosis of entire rotator cuff  Moderate AC arthritis with mild bursitis   Lumbar spine  Xray 03/19/2020  IMPRESSION: Lumbosacral spondylosis, without acute osseous abnormality.   Reviewed prior external information including notes and imaging from previsou exam, outside providers and external EMR if available.   As well as notes that were available from care everywhere and other healthcare systems.  Past medical history, social, surgical and family history  all reviewed in electronic medical record.  No pertanent information unless stated regarding to the chief complaint.   Past Medical History:  Diagnosis Date  . Allergic rhinitis, mild   . Anemia   . Depression   . Hypertension   . Ovarian cyst     Allergies  Allergen Reactions  . Gabapentin Swelling  . Quinolones Other (See Comments)  . Ciprofloxacin Other (See Comments)    Hx of asc. Aortic dilitation.   Family hx of Marfans     Review of Systems:  No headache, visual changes, nausea, vomiting, diarrhea, constipation, dizziness, abdominal pain, skin rash, fevers, chills, night sweats, weight loss, swollen lymph nodes, , joint swelling, chest pain, shortness of breath, mood changes. POSITIVE muscle aches, body aches  Objective  Blood pressure (!) 128/94, pulse 85, height 5\' 1"  (1.549 m), weight 167 lb (75.8 kg), last menstrual period 06/28/2016, SpO2 98 %.   General: No apparent distress alert and oriented x3 mood and affect normal, dressed appropriately.  HEENT: Pupils equal, extraocular movements intact  Respiratory: Patient's speak in full sentences and does not appear short of breath  Cardiovascular: No lower extremity edema, non tender, no erythema  Gait normal with good balance and coordination.  MSK:  Non tender with full range of motion and good stability and symmetric strength and tone of shoulders, elbows, wrist, hip, knee and ankles bilaterally.  Back - Normal skin, Spine with normal alignment and no deformity.  No tenderness to vertebral process palpation.  Paraspinous muscles are not tender and without spasm.   Range of motion is full at neck and lumbar sacral regions  Osteopathic findings  C6 flexed rotated and side bent left T8 extended rotated and side bent left L3 flexed  rotated and side bent right Sacrum right on right       Assessment and Plan:  Lumbar radiculopathy Patient has done relatively well at this moment.  No radicular symptoms at the moment.   Still has stiffness.  Does respond well to manipulation.  Discussed the meloxicam.  Discussed other treatment options.  Patient will continue to be active and follow-up again in 6 to 8 weeks    Nonallopathic problems  Decision today to treat with OMT was based on Physical Exam  After verbal consent patient was treated with HVLA, ME, FPR techniques in cervical,, thoracic, lumbar, and sacral  areas  Patient tolerated the procedure well with improvement in symptoms  Patient given exercises, stretches and lifestyle modifications  See medications in patient instructions if given  Patient will follow up in 6-8 weeks      The above documentation has been reviewed and is accurate and complete [name], DO       Note: This dictation was prepared with Dragon dictation along with smaller phrase technology. Any transcriptional errors that result from this process are unintentional.

## 2020-05-01 ENCOUNTER — Encounter: Payer: Self-pay | Admitting: Family Medicine

## 2020-05-01 NOTE — Assessment & Plan Note (Signed)
Patient has done relatively well at this moment.  No radicular symptoms at the moment.  Still has stiffness.  Does respond well to manipulation.  Discussed the meloxicam.  Discussed other treatment options.  Patient will continue to be active and follow-up again in 6 to 8 weeks

## 2020-05-13 ENCOUNTER — Ambulatory Visit: Payer: 59

## 2020-05-19 ENCOUNTER — Other Ambulatory Visit: Payer: Self-pay

## 2020-05-19 ENCOUNTER — Ambulatory Visit
Admission: RE | Admit: 2020-05-19 | Discharge: 2020-05-19 | Disposition: A | Discharge status: Home / Self Care | Payer: 59 | Source: Ambulatory Visit | Attending: Obstetrics and Gynecology | Admitting: Obstetrics and Gynecology

## 2020-05-19 DIAGNOSIS — Z1231 Encounter for screening mammogram for malignant neoplasm of breast: Secondary | ICD-10-CM

## 2020-05-20 ENCOUNTER — Ambulatory Visit: Payer: 59 | Attending: Internal Medicine

## 2020-05-20 DIAGNOSIS — Z23 Encounter for immunization: Secondary | ICD-10-CM

## 2020-05-20 NOTE — Progress Notes (Signed)
   Covid-19 Vaccination [facility]  Name:  Tamara [name]    MRN: [id number] DOB: 12/14/1960  05/20/2020  Tamara [name] was observed post Covid-19 immunization for 15 minutes without incident. She was provided with Vaccine Information Sheet and instruction to access the V-Safe system.   Tamara [name] was instructed to call 911 with any severe reactions post vaccine: Marland Kitchen Difficulty breathing  . Swelling of face and throat  . A fast heartbeat  . A bad rash all over body  . Dizziness and weakness   Immunizations Administered    Name Date Dose VIS Date Route   Moderna Covid-19 Booster Vaccine 05/20/2020 12:03 PM 0.25 mL 12/04/2019 Intramuscular   Manufacturer: Moderna   Lot: [id number]   [place]: [id number]

## 2020-05-22 ENCOUNTER — Other Ambulatory Visit: Payer: Self-pay | Admitting: Family Medicine

## 2020-05-25 ENCOUNTER — Other Ambulatory Visit: Payer: Self-pay

## 2020-05-25 MED ORDER — LEVOTHYROXINE SODIUM 50 MCG PO TABS: ORAL_TABLET | ORAL | 3 refills | Status: DC

## 2020-06-01 ENCOUNTER — Other Ambulatory Visit (HOSPITAL_COMMUNITY): Payer: Self-pay

## 2020-06-01 MED ORDER — COVID-19 MRNA VACC (MODERNA) 100 MCG/0.5ML IM SUSP
INTRAMUSCULAR | 0 refills | Status: DC
  Filled 2020-06-01: qty 0.5, 17d supply, fill #0

## 2020-06-03 ENCOUNTER — Other Ambulatory Visit (HOSPITAL_COMMUNITY): Payer: Self-pay

## 2020-06-03 ENCOUNTER — Ambulatory Visit: Payer: 59

## 2020-06-17 NOTE — Progress Notes (Signed)
[place] Phone: [phone] Subjective:   [name], am serving as a scribe for Dr. Hulan Saas. This visit occurred during the SARS-CoV-2 public health emergency.  Safety protocols were in place, including screening questions prior to the visit, additional usage of staff PPE, and extensive cleaning of exam room while observing appropriate contact time as indicated for disinfecting solutions.   I'm seeing this patient by the request  of:  [name], MD  CC: Neck pain and lower back pain follow-up  [id number]  Tamara [name] is a 60 y.o. female coming in with complaint of back and neck pain. OMT 04/30/2020. Patient states that she is doing well. Back pain worse with standing for prolgoned periods.  Patient states otherwise has been doing relatively well.  Has been a little more active recently.  Still having stress but does not feel that that is contributing as much to the discomfort at the moment.  Medications patient has been prescribed: Hydroxizine, Synthroid      Past Medical History:  Diagnosis Date  . Allergic rhinitis, mild   . Anemia   . Depression   . Hypertension   . Ovarian cyst     Allergies  Allergen Reactions  . Gabapentin Swelling  . Quinolones Other (See Comments)  . Ciprofloxacin Other (See Comments)    Hx of asc. Aortic dilitation.   Family hx of Marfans     Review of Systems:  No headache, visual changes, nausea, vomiting, diarrhea, constipation, dizziness, abdominal pain, skin rash, fevers, chills, night sweats, weight loss, swollen lymph nodes, body aches, joint swelling, chest pain, shortness of breath, mood changes. POSITIVE muscle aches  Objective  Blood pressure 112/90, pulse (!) 101, height 5\' 1"  (1.549 m), weight 168 lb (76.2 kg), last menstrual period 06/28/2016, SpO2 98 %.   General: No apparent distress alert and oriented x3 mood and affect normal,  dressed appropriately.  HEENT: Pupils equal, extraocular movements intact  Respiratory: Patient's speak in full sentences and does not appear short of breath  Cardiovascular: No lower extremity edema, non tender, no erythema  Gait normal with good balance and coordination.  MSK: Mild arthritic changes of multiple joints Back -neck exam does have some limited range of motion in all planes.  Tender to palpation in the paraspinal musculature of the cervical spine.  Mild crepitus noted.  Negative Spurling's Patient's low back exam does have some very mild tightness as well.  Worsening pain with extension noted.  Osteopathic findings  C2 flexed rotated and side bent right T3 extended rotated and side bent right inhaled rib T8 extended rotated and side bent left L2 flexed rotated and side bent right Sacrum right on right     Assessment and Plan:  Stenosis of lateral recess of lumbar spine Patient is doing relatively well at this moment.  Discussed icing regimen and home exercises.  Discussed avoiding certain activities.  Patient has made significant progress.  He does have very mild tightness but nothing severe.  Follow-up with me again in 8 weeks    Nonallopathic problems  Decision today to treat with OMT was based on Physical Exam  After verbal consent patient was treated with  ME, FPR techniques in cervical, rib, thoracic, lumbar, and sacral  areas  Patient tolerated the procedure well with improvement in symptoms  Patient given exercises, stretches and lifestyle modifications  See medications in patient instructions if given  Patient will follow  up in 4-8 weeks      The above documentation has been reviewed and is accurate and complete [name], DO       Note: This dictation was prepared with Dragon dictation along with smaller phrase technology. Any transcriptional errors that result from this process are unintentional.

## 2020-06-18 ENCOUNTER — Ambulatory Visit: Payer: 59 | Admitting: Family Medicine

## 2020-06-18 ENCOUNTER — Encounter: Payer: Self-pay | Admitting: Family Medicine

## 2020-06-18 ENCOUNTER — Other Ambulatory Visit: Payer: Self-pay

## 2020-06-18 VITALS — BP 112/90 | HR 101 | Ht 61.0 in | Wt 168.0 lb

## 2020-06-18 DIAGNOSIS — M9901 Segmental and somatic dysfunction of cervical region: Secondary | ICD-10-CM

## 2020-06-18 DIAGNOSIS — M9903 Segmental and somatic dysfunction of lumbar region: Secondary | ICD-10-CM

## 2020-06-18 DIAGNOSIS — M9908 Segmental and somatic dysfunction of rib cage: Secondary | ICD-10-CM

## 2020-06-18 DIAGNOSIS — M9904 Segmental and somatic dysfunction of sacral region: Secondary | ICD-10-CM

## 2020-06-18 DIAGNOSIS — M48061 Spinal stenosis, lumbar region without neurogenic claudication: Secondary | ICD-10-CM

## 2020-06-18 DIAGNOSIS — M9902 Segmental and somatic dysfunction of thoracic region: Secondary | ICD-10-CM | POA: Diagnosis not present

## 2020-06-18 NOTE — Patient Instructions (Signed)
Keep doing probiotic See me in 2 months

## 2020-06-18 NOTE — Assessment & Plan Note (Signed)
Patient is doing relatively well at this moment.  Discussed icing regimen and home exercises.  Discussed avoiding certain activities.  Patient has made significant progress.  He does have very mild tightness but nothing severe.  Follow-up with me again in 8 weeks

## 2020-06-21 ENCOUNTER — Other Ambulatory Visit: Payer: Self-pay | Admitting: Family Medicine

## 2020-06-23 ENCOUNTER — Encounter: Payer: Self-pay | Admitting: Internal Medicine

## 2020-07-08 ENCOUNTER — Other Ambulatory Visit: Payer: Self-pay | Admitting: Family Medicine

## 2020-07-20 ENCOUNTER — Other Ambulatory Visit: Payer: Self-pay | Admitting: Internal Medicine

## 2020-07-28 ENCOUNTER — Other Ambulatory Visit: Payer: Self-pay | Admitting: Family Medicine

## 2020-08-07 ENCOUNTER — Ambulatory Visit (HOSPITAL_COMMUNITY)
Admission: EM | Admit: 2020-08-07 | Discharge: 2020-08-07 | Disposition: A | Discharge status: Home / Self Care | Payer: 59 | Attending: Emergency Medicine | Admitting: Emergency Medicine

## 2020-08-07 ENCOUNTER — Encounter (HOSPITAL_COMMUNITY): Payer: Self-pay

## 2020-08-07 DIAGNOSIS — L237 Allergic contact dermatitis due to plants, except food: Secondary | ICD-10-CM

## 2020-08-07 MED ORDER — DEXAMETHASONE 10 MG/ML FOR PEDIATRIC ORAL USE
INTRAMUSCULAR | Status: AC
  Filled 2020-08-07: qty 1

## 2020-08-07 MED ORDER — PREDNISONE 20 MG PO TABS: 40.0000 mg | ORAL_TABLET | Freq: Every day | ORAL | 0 refills | Status: AC

## 2020-08-07 MED ORDER — DEXAMETHASONE SODIUM PHOSPHATE 10 MG/ML IJ SOLN
10.0000 mg | Freq: Once | INTRAMUSCULAR | Status: AC
  Administered 2020-08-07: 10 mg via INTRAMUSCULAR

## 2020-08-07 NOTE — Discharge Instructions (Addendum)
Start the prednisone tomorrow with breakfast and take it for the next 5 days.    You can use the anti-itch cream you have been using for relief.  You can also try benadryl cream or take Benadryl pills as needed for itching.   Return or go to the Emergency Department if symptoms worsen or do not improve in the next few days.
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
## 2020-08-07 NOTE — ED Triage Notes (Addendum)
Pt in with c/o rash that started on her face and has spread to her legs that she noticed a few days ago after doing yard work  Pt has tried benadryl and a bug cream with no relief from sx  Pt states she had a similar rash last year and was given prednisone with relief

## 2020-08-12 NOTE — Progress Notes (Signed)
[facility] [address] [place] Phone: [phone] Subjective:   I Tamara [name] am serving as a Education administrator for Dr. Hulan Saas.  This visit occurred during the SARS-CoV-2 public health emergency.  Safety protocols were in place, including screening questions prior to the visit, additional usage of staff PPE, and extensive cleaning of exam room while observing appropriate contact time as indicated for disinfecting solutions.   I'm seeing this patient by the request  of:  [name], MD  CC: Neck and back pain follow-up  [id number]  Tamara [name] is a 60 y.o. female coming in with complaint of back and neck pain. OMT 06/18/2020. Patient states the back and neck is doing well.  Mild upper back pain.  Will be traveling.  Has responded well though to conservative therapy and feels like going to some mild tightness overall.  Nothing that stopping her from activities at the moment.  Patient describes the pain when it does occur is more of an ache than true pain.  Medications patient has been prescribed: Meloxicam  Taking:         Reviewed prior external information including notes and imaging from previsou exam, outside providers and external EMR if available.   As well as notes that were available from care everywhere and other healthcare systems.  Past medical history, social, surgical and family history all reviewed in electronic medical record.  No pertanent information unless stated regarding to the chief complaint.   Past Medical History:  Diagnosis Date   Allergic rhinitis, mild    Anemia    Depression    Hypertension    Ovarian cyst     Allergies  Allergen Reactions   Gabapentin Swelling   Quinolones Other (See Comments)   Ciprofloxacin Other (See Comments)    Hx of asc. Aortic dilitation.   Family hx of Marfans     Review of Systems:  No headache, visual changes, nausea, vomiting, diarrhea, constipation,  dizziness, abdominal pain, skin rash, fevers, chills, night sweats, weight loss, swollen lymph nodes, body aches, joint swelling, chest pain, shortness of breath, mood changes. POSITIVE muscle aches  Objective  Blood pressure (!) 118/92, pulse 76, height 5\' 1"  (1.549 m), weight 163 lb (73.9 kg), last menstrual period 06/28/2016, SpO2 100 %.   General: No apparent distress alert and oriented x3 mood and affect normal, dressed appropriately.  HEENT: Pupils equal, extraocular movements intact  Respiratory: Patient's speak in full sentences and does not appear short of breath  Cardiovascular: No lower extremity edema, non tender, no erythema  Back exam does have some mild loss of lordosis of the lumbar spine.  Tightness with FABER test.  Patient does have tenderness in the thoracolumbar juncture.  Tightness in the parascapular region right greater than left.  Osteopathic findings  C7 flexed rotated and side bent left T3 extended rotated and side bent right inhaled rib T9 extended rotated and side bent left L2 flexed rotated and side bent right Sacrum right on right       Assessment and Plan:  Upper back pain Patient does continue to have upper back pain.  Seems to be in the thoracic area.  No significant midline pain at the moment though.  Patient responded well to osteopathic manipulation and home exercises.  Patient is doing very well with her medications including the meloxicam when needed as well as on the Effexor.  Follow-up with me again in 2 months   Nonallopathic problems  Decision today to treat with OMT was based on Physical Exam  After verbal consent patient was treated with HVLA, ME, FPR techniques in cervical, rib, thoracic, lumbar, and sacral  areas  Patient tolerated the procedure well with improvement in symptoms  Patient given exercises, stretches and lifestyle modifications  See medications in patient instructions if given  Patient will follow up in 4-8  weeks      The above documentation has been reviewed and is accurate and complete [name], DO        Note: This dictation was prepared with Dragon dictation along with smaller phrase technology. Any transcriptional errors that result from this process are unintentional.

## 2020-08-13 ENCOUNTER — Other Ambulatory Visit: Payer: Self-pay

## 2020-08-13 ENCOUNTER — Encounter: Payer: Self-pay | Admitting: Family Medicine

## 2020-08-13 ENCOUNTER — Ambulatory Visit: Payer: 59 | Admitting: Family Medicine

## 2020-08-13 VITALS — BP 118/92 | HR 76 | Ht 61.0 in | Wt 163.0 lb

## 2020-08-13 DIAGNOSIS — M9903 Segmental and somatic dysfunction of lumbar region: Secondary | ICD-10-CM

## 2020-08-13 DIAGNOSIS — M9901 Segmental and somatic dysfunction of cervical region: Secondary | ICD-10-CM

## 2020-08-13 DIAGNOSIS — M9908 Segmental and somatic dysfunction of rib cage: Secondary | ICD-10-CM | POA: Diagnosis not present

## 2020-08-13 DIAGNOSIS — M549 Dorsalgia, unspecified: Secondary | ICD-10-CM | POA: Diagnosis not present

## 2020-08-13 DIAGNOSIS — M4722 Other spondylosis with radiculopathy, cervical region: Secondary | ICD-10-CM

## 2020-08-13 DIAGNOSIS — M9902 Segmental and somatic dysfunction of thoracic region: Secondary | ICD-10-CM

## 2020-08-13 DIAGNOSIS — M9904 Segmental and somatic dysfunction of sacral region: Secondary | ICD-10-CM | POA: Diagnosis not present

## 2020-08-13 NOTE — Patient Instructions (Addendum)
Good to see you Thank you for the dad stories  Happy 4th! Keep up everything else See me again in 2 months

## 2020-08-13 NOTE — Assessment & Plan Note (Signed)
Patient does continue to have upper back pain.  Seems to be in the thoracic area.  No significant midline pain at the moment though.  Patient responded well to osteopathic manipulation and home exercises.  Patient is doing very well with her medications including the meloxicam when needed as well as on the Effexor.  Follow-up with me again in 2 months

## 2020-10-08 ENCOUNTER — Other Ambulatory Visit: Payer: Self-pay | Admitting: Family Medicine

## 2020-10-08 ENCOUNTER — Other Ambulatory Visit: Payer: Self-pay

## 2020-10-08 MED ORDER — LEVOTHYROXINE SODIUM 50 MCG PO TABS: ORAL_TABLET | ORAL | 0 refills | Status: DC

## 2020-10-13 ENCOUNTER — Encounter: Payer: Self-pay | Admitting: Family Medicine

## 2020-10-13 ENCOUNTER — Ambulatory Visit: Payer: 59 | Admitting: Family Medicine

## 2020-10-13 ENCOUNTER — Other Ambulatory Visit: Payer: Self-pay

## 2020-10-13 VITALS — BP 102/62 | HR 105 | Ht 61.0 in | Wt 158.0 lb

## 2020-10-13 DIAGNOSIS — M9903 Segmental and somatic dysfunction of lumbar region: Secondary | ICD-10-CM

## 2020-10-13 DIAGNOSIS — M9908 Segmental and somatic dysfunction of rib cage: Secondary | ICD-10-CM

## 2020-10-13 DIAGNOSIS — M9902 Segmental and somatic dysfunction of thoracic region: Secondary | ICD-10-CM

## 2020-10-13 DIAGNOSIS — M48061 Spinal stenosis, lumbar region without neurogenic claudication: Secondary | ICD-10-CM | POA: Diagnosis not present

## 2020-10-13 DIAGNOSIS — M9901 Segmental and somatic dysfunction of cervical region: Secondary | ICD-10-CM | POA: Diagnosis not present

## 2020-10-13 DIAGNOSIS — M9904 Segmental and somatic dysfunction of sacral region: Secondary | ICD-10-CM

## 2020-10-13 NOTE — Assessment & Plan Note (Signed)
Patient is doing relatively well at the moment.  We discussed with patient that we continue to monitor.  Has made some improvement at the moment.  Does feel that the probiotic seems to be helping at the moment.  Nothing as severe as what it has been previously.  Patient will follow up with me again in 6 to 8 weeks.

## 2020-10-13 NOTE — Patient Instructions (Signed)
Great to see you  Tamara [name] is your friend Stay active  See me again in 8 weeks

## 2020-10-13 NOTE — Progress Notes (Signed)
[place] Phone: [phone] Subjective:   [name], am serving as a scribe for Dr. Hulan Saas.  This visit occurred during the SARS-CoV-2 public health emergency.  Safety protocols were in place, including screening questions prior to the visit, additional usage of staff PPE, and extensive cleaning of exam room while observing appropriate contact time as indicated for disinfecting solutions.   I'm seeing this patient by the request  of:  [name], MD  CC: Neck and lower back pain  [id number]  Tamara [name] is a 60 y.o. female coming in with complaint of back and neck pain. OMT 08/13/2020. Patient states that she has doing well since last visit. Patient has been using Meloxicam daily.  Patient states no significant difficulty with any abdominal pain.  Feels like she is doing relatively well as long as she takes the medication.  Medications patient has been prescribed: Synthroid, Meloxicam          Past Medical History:  Diagnosis Date   Allergic rhinitis, mild    Anemia    Depression    Hypertension    Ovarian cyst     Allergies  Allergen Reactions   Gabapentin Swelling   Quinolones Other (See Comments)   Ciprofloxacin Other (See Comments)    Hx of asc. Aortic dilitation.   Family hx of Marfans     Review of Systems:  No headache, visual changes, nausea, vomiting, diarrhea, constipation, dizziness, abdominal pain, skin rash, fevers, chills, night sweats, weight loss, swollen lymph nodes, body aches, joint swelling, chest pain, shortness of breath, mood changes. POSITIVE muscle aches  Objective  Blood pressure 102/62, pulse (!) 105, height '5\' 1"'$  (1.549 m), weight 158 lb (71.7 kg), last menstrual period 06/28/2016, SpO2 98 %.   General: No apparent distress alert and oriented x3 mood and affect normal, dressed appropriately.  HEENT: Pupils equal, extraocular movements intact   Respiratory: Patient's speak in full sentences and does not appear short of breath  Cardiovascular: No lower extremity edema, non tender, no erythema  Low back exam does have some mild loss of lordosis.  Some tenderness to palpation of the paraspinal musculature.  More tightness in the thoracolumbar juncture.  Tightness noted of the lower back with tightness with straight leg test.  Osteopathic findings  C2 flexed rotated and side bent right C7 flexed rotated and side bent left T3 extended rotated and side bent right inhaled rib T9 extended rotated and side bent left L2 flexed rotated and side bent right Sacrum right on right       Assessment and Plan: Stenosis of lateral recess of lumbar spine Patient is doing relatively well at the moment.  We discussed with patient that we continue to monitor.  Has made some improvement at the moment.  Does feel that the probiotic seems to be helping at the moment.  Nothing as severe as what it has been previously.  Patient will follow up with me again in 6 to 8 weeks.    Nonallopathic problems  Decision today to treat with OMT was based on Physical Exam  After verbal consent patient was treated with HVLA, ME, FPR techniques in cervical, rib, thoracic, lumbar, and sacral  areas  Patient tolerated the procedure well with improvement in symptoms  Patient given exercises, stretches and lifestyle modifications  See medications in patient instructions if given  Patient will follow up in 4-8 weeks  The above documentation has been reviewed and is accurate and complete [name], DO       Note: This dictation was prepared with Dragon dictation along with smaller phrase technology. Any transcriptional errors that result from this process are unintentional.

## 2020-10-21 ENCOUNTER — Other Ambulatory Visit: Payer: Self-pay | Admitting: Internal Medicine

## 2020-11-07 ENCOUNTER — Other Ambulatory Visit: Payer: Self-pay | Admitting: Family Medicine

## 2020-12-07 ENCOUNTER — Other Ambulatory Visit: Payer: Self-pay | Admitting: Family Medicine

## 2020-12-07 ENCOUNTER — Ambulatory Visit (HOSPITAL_COMMUNITY): Payer: 59 | Attending: Cardiovascular Disease

## 2020-12-07 ENCOUNTER — Other Ambulatory Visit: Payer: Self-pay

## 2020-12-07 DIAGNOSIS — I10 Essential (primary) hypertension: Secondary | ICD-10-CM | POA: Diagnosis not present

## 2020-12-07 DIAGNOSIS — Z8249 Family history of ischemic heart disease and other diseases of the circulatory system: Secondary | ICD-10-CM | POA: Diagnosis not present

## 2020-12-07 LAB — ECHOCARDIOGRAM COMPLETE
Area-P 1/2: 3.77 cm2
S' Lateral: 2 cm

## 2020-12-08 ENCOUNTER — Encounter: Payer: Self-pay | Admitting: Family Medicine

## 2020-12-08 ENCOUNTER — Ambulatory Visit: Payer: 59 | Admitting: Sports Medicine

## 2020-12-08 VITALS — BP 118/84 | HR 96 | Ht 61.0 in | Wt 150.0 lb

## 2020-12-08 DIAGNOSIS — M9902 Segmental and somatic dysfunction of thoracic region: Secondary | ICD-10-CM

## 2020-12-08 DIAGNOSIS — M9903 Segmental and somatic dysfunction of lumbar region: Secondary | ICD-10-CM

## 2020-12-08 DIAGNOSIS — M9905 Segmental and somatic dysfunction of pelvic region: Secondary | ICD-10-CM

## 2020-12-08 DIAGNOSIS — M549 Dorsalgia, unspecified: Secondary | ICD-10-CM

## 2020-12-08 NOTE — Patient Instructions (Signed)
See you again in 6-8 weeks

## 2020-12-08 NOTE — Progress Notes (Signed)
[name] Tamara [name] Phone: [phone]   Assessment and Plan:     1. Upper back pain 2. Somatic dysfunction of thoracic region 3. Somatic dysfunction of lumbar region 4. Somatic dysfunction of pelvic region -Chronic with exacerbation, subsequent sports medicine visit - Recurrence of musculoskeletal pains most prominent in thoracic spine region that has been well treated with OMT in the past.  Patient elects for repeat OMT today   Decision today to treat with OMT was based on Physical Exam   After verbal consent patient was treated with HVLA (high velocity low amplitude), ME (muscle energy), FPR (flex positional release), ST (soft tissue), PC/PD (Pelvic Compression/ Pelvic Decompression) techniques in thoracic, lumbar, and pelvic areas. Patient tolerated the procedure well with improvement in symptoms.  Patient educated on potential side effects of soreness and recommended to rest, hydrate, and use Tylenol as needed for pain control.   Pertinent previous records reviewed include none   Follow Up: In 6 to 8 weeks for repeat OMT   Subjective:   I, Tamara [name], am serving as a Education administrator for Tamara [name].  Chief Complaint: LBP  HPI:   12/08/20 LBP is hurting but doing better and wrist are doing better.  10/13/2020 Relevant Historical Information: Patient is doing relatively well at the moment.  We discussed with patient that we continue to monitor.  Has made some improvement at the moment.  Does feel that the probiotic seems to be helping at the moment.  Nothing as severe as what it has been previously.  Patient will follow up with me again in 6 to 8 weeks.  Additional pertinent review of systems negative.  Current Outpatient Medications  Medication Sig Dispense Refill   ALPRAZolam (XANAX) 0.5 MG tablet TAKE 1 TABLET(0.5 MG) BY MOUTH THREE TIMES DAILY AS NEEDED FOR ANXIETY 90 tablet 2   cetirizine (ZYRTEC) 10  MG tablet Take 1 tablet by mouth daily.     Cholecalciferol (THERA-D 2000) 50 MCG (2000 UT) TABS 1 tab by mouth once daily 90 tablet 3   COVID-19 mRNA vaccine, Moderna, 100 MCG/0.5ML injection Inject into the muscle. 0.5 mL 0   hydrOXYzine (ATARAX/VISTARIL) 10 MG tablet TAKE 1 TABLET(10 MG) BY MOUTH EVERY 8 HOURS AS NEEDED 30 tablet 0   levothyroxine (SYNTHROID) 50 MCG tablet TAKE 1 TABLET(50 MCG) BY MOUTH DAILY 30 tablet 0   losartan (COZAAR) 100 MG tablet TAKE 1 TABLET(100 MG) BY MOUTH DAILY 90 tablet 3   meloxicam (MOBIC) 15 MG tablet TAKE 1 TABLET(15 MG) BY MOUTH DAILY 90 tablet 0   tobramycin (TOBREX) 0.3 % ophthalmic solution Place 1 drop into the left eye every 6 (six) hours. 5 mL 0   venlafaxine XR (EFFEXOR-XR) 75 MG 24 hr capsule TAKE ONE CAPSULE BY MOUTH DAILY WITH BREAKFAST 90 capsule 0   vitamin B-12 (CYANOCOBALAMIN) 1000 MCG tablet Take 1 tablet (1,000 mcg total) by mouth daily. 90 tablet 3   No current facility-administered medications for this visit.      Objective:     Vitals:   12/08/20 1521  BP: 118/84  Pulse: 96  SpO2: 98%  Weight: 150 lb (68 kg)  Height: 5\' 1"  (1.549 m)      Body mass index is 28.34 kg/m.    Physical Exam:     General: Well-appearing, cooperative, sitting comfortably in no acute distress.   OMT Physical Exam:  ASIS Compression Test: Positive Right Thoracic: TTP paraspinal,  T6-10 RRSL Lumbar: TTP paraspinal, L3 RLSL Pelvis: Left anterior innominate  Electronically signed by:  Tamara PaulMarguerita [name] Sports Medicine 4:38 PM 12/08/20

## 2020-12-13 ENCOUNTER — Encounter: Payer: Self-pay | Admitting: Cardiovascular Disease

## 2020-12-13 NOTE — Progress Notes (Signed)
Cardiology Office Note:    Date:  12/13/2020   ID:  LENIX BENOIST, DOB 1960/03/30, MRN [id number]  PCP:  [name], MD  [facility] Cardiologist:  Kaleesi Guyton   [facility] Electrophysiologist:  None   Referring MD: [name], MD   Chief Complaint  Patient presents with   Hypertension             Tamara [name] is a 60 y.o. 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  She has grade 1 diastolic dysfunction. Has very minimal aortic dilitation Mother died at age 72 of a thoracic aortic aneurims Thought to be due  Marfans syndrome  Mother had multple signs of marfans - myopia, inverted nipple  Tamara [name] also has similar characteristics  multioke family members have Marfans   Pt also has signs of marfarns , Myopia, inverted nipple,  Now finally found to have mildly dilated ascending aorta  No cp,  No dyspnea  Hx of  HTN .    Oct. 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 Was seen byDr. Lattie Corns on Sept. 9, 2021 She has remained asymptomatic.  Her aorta is normal sized. There is no indication to do broad genetic testing in [place] at this point but Dr. Broadus John has recommended testing for the family variant once its identified in her younger brother .  The mild aortic dilatation seen on echo was further evaluated by CT angio Her asc. Aorta measures 36 mm by CT scan   Oct. 31, 2022; Tamara [name] is seen today for follow upf of her HTN and family hx of Marfans syndrome She has been seen by our genetic counsilor- Dr. Lattie Corns who did not recommend any additional genetic testing  Had left wrist surgery   We discussed follow up - will likely see in 2-3  years  She wants follow up of her throacic aorta - very strong family hx of aortic dilatation .    Past Medical History:  Diagnosis Date   Allergic rhinitis, mild    Anemia    Depression    Hypertension    Ovarian cyst     Past Surgical History:  Procedure Laterality Date   LAMINECTOMY     LUMBAR EPIDURAL INJECTION  11/03/2014    Current Medications: No outpatient medications have been marked as taking for the 12/14/20 encounter (Office Visit) with Shanyla Marconi, Wonda Cheng, MD.     Allergies:   Gabapentin, Quinolones, and Ciprofloxacin   Social History   Socioeconomic History   Marital status: Married    Spouse name: Not on file   Number of children: 0   Years of education: Not on file   Highest education level: Not on file  Occupational History   Not on file  Tobacco Use   Smoking status: Never   Smokeless tobacco: Never  Vaping Use   Vaping Use: Never used  Substance and Sexual Activity   Alcohol use: Yes    Alcohol/week: 3.0 standard drinks    Types: 3 Standard drinks or equivalent per week   Drug use: No   Sexual activity: Yes    Partners: Male    Birth  control/protection: I.U.D.  Other Topics Concern   Not on file  Social History Narrative   Not on file   Social Determinants of Health   Financial Resource Strain: Not on file  Food Insecurity: Not on file  Transportation Needs: Not on file  Physical Activity: Not on file  Stress: Not on file  Social Connections: Not on file     Family History: The patient's family history includes Alcohol abuse in her father; Depression in her father and mother.  ROS:   Please see the history of present illness.     All other systems reviewed and are negative.  EKGs/Labs/Other Studies Reviewed:    The following studies were reviewed today:    Recent Labs: 01/13/2020: ALT 16; Hemoglobin 13.9; Platelets 292.0; TSH 1.92  Recent Lipid Panel    Component Value Date/Time   CHOL 176 01/13/2020 1406   TRIG 217.0  (H) 01/13/2020 1406   HDL 65.10 01/13/2020 1406   CHOLHDL 3 01/13/2020 1406   VLDL 43.4 (H) 01/13/2020 1406   LDLCALC 66 11/12/2018 1648   LDLDIRECT 89.0 01/13/2020 1406    Physical Exam:     Physical Exam: Last menstrual period 06/28/2016.  GEN:  Well nourished, well developed in no acute distress HEENT: Normal NECK: No JVD; No carotid bruits LYMPHATICS: No lymphadenopathy CARDIAC: RRR , no murmurs, rubs, gallops RESPIRATORY:  Clear to auscultation without rales, wheezing or rhonchi  ABDOMEN: Soft, non-tender, non-distended MUSCULOSKELETAL:  No edema; No deformity  SKIN: Warm and dry NEUROLOGIC:  Alert and oriented x 3   ECG:    ASSESSMENT:    No diagnosis found. PLAN:     1.  Hypertension: Her blood pressure remains well controlled.  2.  Family history of dilated aorta: She has multiple family members with a history of a dilated aorta.  Originally they were told it might be Marfan's.  She has met with Dr. Broadus John who informed her that there is no evidence of Marfan syndrome but it could be a different aortopathy. She is had multiple echocardiograms and eventually had a CT scan which showed a normal-sized aorta.  Her echo showed that her aorta size is at the upper limits of normal.  She would like to continue to follow-up periodically just to make sure that her aorta stays normal given her strong family history.  We will plan on seeing her again in 2 to 3 years for follow-up visit.   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 Lattie Corns.    Tamara [name] has numerous family members that have Marfan's or similar conditions.  Several have had aortic ruptures and hollow organ rupture.  Tamara [name] does not have any aortic dilatation but did have mild aortic dilatation by echo.  Her aorta measures 36 mm which is at the upper limits of normal.  I think it makes sense to continue to follow her.  We will get an echocardiogram next year.  If we see any growth in the size of her  ascending aorta then we can confirm/verify with a CT angiogram.  She will continue with diet, exercise, weight loss efforts.  She will continue with the current blood pressure medications.  Her brother has received some genetic testing information.  She will be sending that information along in her [place] email.  Medication Adjustments/Labs and Tests Ordered: Current medicines are reviewed at length with the patient today.  Concerns regarding medicines are outlined above.  No orders of the defined types were placed  in this encounter.  No orders of the defined types were placed in this encounter.    There are no Patient Instructions on file for this visit.   Signed, Mertie Moores, MD  12/13/2020 9:01 PM    [facility]

## 2020-12-14 ENCOUNTER — Encounter: Payer: Self-pay | Admitting: Cardiovascular Disease

## 2020-12-14 ENCOUNTER — Ambulatory Visit: Payer: 59 | Admitting: Cardiovascular Disease

## 2020-12-14 ENCOUNTER — Other Ambulatory Visit: Payer: Self-pay

## 2020-12-14 VITALS — BP 122/78 | HR 91 | Ht 61.0 in | Wt 151.6 lb

## 2020-12-14 DIAGNOSIS — I77819 Aortic ectasia, unspecified site: Secondary | ICD-10-CM

## 2020-12-14 NOTE — Patient Instructions (Signed)
Medication Instructions:  Your physician recommends that you continue on your current medications as directed. Please refer to the Current Medication list given to you today.  *If you need a refill on your cardiac medications before your next appointment, please call your pharmacy*   Lab Work: NONE If you have labs (blood work) drawn today and your tests are completely normal, you will receive your results only by: [place] (if you have MyChart) OR A paper copy in the mail If you have any lab test that is abnormal or we need to change your treatment, we will call you to review the results.   Testing/Procedures: NONE   Follow-Up: At [facility], you and your health needs are our priority.  As part of our continuing mission to provide you with exceptional heart care, we have created designated Provider Care Teams.  These Care Teams include your primary Cardiologist (physician) and Advanced Practice Providers (APPs -  Physician Assistants and Nurse Practitioners) who all work together to provide you with the care you need, when you need it.  We recommend signing up for the patient portal called "MyChart".  Sign up information is provided on this After Visit Summary.  MyChart is used to connect with patients for Virtual Visits (Telemedicine).  Patients are able to view lab/test results, encounter notes, upcoming appointments, etc.  Non-urgent messages can be sent to your provider as well.   To learn more about what you can do with MyChart, go to [email or web address].    Your next appointment:   2-3 year(s)  The format for your next appointment:   In Person  Provider:   You may see DR. NAHSER or one of the following Advanced Practice Providers on your designated Care Team:   Richardson Dopp, PA-C [place], [place]

## 2020-12-17 NOTE — Addendum Note (Signed)
Addended by: Lynn Ito on: 12/17/2020 11:21 AM   Modules accepted: Orders

## 2020-12-18 ENCOUNTER — Encounter: Payer: 59 | Admitting: Internal Medicine

## 2020-12-23 ENCOUNTER — Encounter: Payer: 59 | Admitting: Internal Medicine

## 2020-12-30 ENCOUNTER — Other Ambulatory Visit: Payer: Self-pay

## 2020-12-30 ENCOUNTER — Encounter: Payer: Self-pay | Admitting: Internal Medicine

## 2020-12-30 ENCOUNTER — Ambulatory Visit (INDEPENDENT_AMBULATORY_CARE_PROVIDER_SITE_OTHER): Payer: 59 | Admitting: Internal Medicine

## 2020-12-30 VITALS — BP 122/70 | HR 120 | Resp 18 | Ht 61.0 in | Wt 150.6 lb

## 2020-12-30 DIAGNOSIS — E538 Deficiency of other specified B group vitamins: Secondary | ICD-10-CM | POA: Diagnosis not present

## 2020-12-30 DIAGNOSIS — R739 Hyperglycemia, unspecified: Secondary | ICD-10-CM

## 2020-12-30 DIAGNOSIS — L659 Nonscarring hair loss, unspecified: Secondary | ICD-10-CM

## 2020-12-30 DIAGNOSIS — I10 Essential (primary) hypertension: Secondary | ICD-10-CM

## 2020-12-30 DIAGNOSIS — E559 Vitamin D deficiency, unspecified: Secondary | ICD-10-CM | POA: Diagnosis not present

## 2020-12-30 DIAGNOSIS — Z0000 Encounter for general adult medical examination without abnormal findings: Secondary | ICD-10-CM | POA: Diagnosis not present

## 2020-12-30 DIAGNOSIS — Z23 Encounter for immunization: Secondary | ICD-10-CM

## 2020-12-30 LAB — VITAMIN D 25 HYDROXY (VIT D DEFICIENCY, FRACTURES): VITD: 37.13 ng/mL (ref 30.00–100.00)

## 2020-12-30 LAB — T4, FREE: Free T4: 0.7 ng/dL (ref 0.60–1.60)

## 2020-12-30 LAB — LIPID PANEL
Cholesterol: 206 mg/dL — ABNORMAL HIGH (ref 0–200)
HDL: 104.2 mg/dL (ref >39.00)
LDL Cholesterol: 85 mg/dL (ref 0–99)
NonHDL: 101.55
Total CHOL/HDL Ratio: 2
Triglycerides: 83 mg/dL (ref 0.0–149.0)
VLDL: 16.6 mg/dL (ref 0.0–40.0)

## 2020-12-30 LAB — CBC WITH DIFFERENTIAL/PLATELET
Basophils Absolute: 0.1 10*3/uL (ref 0.0–0.1)
Basophils Relative: 0.8 % (ref 0.0–3.0)
Eosinophils Absolute: 0.6 10*3/uL (ref 0.0–0.7)
Eosinophils Relative: 10.1 % — ABNORMAL HIGH (ref 0.0–5.0)
HCT: 41.2 % (ref 36.0–46.0)
Hemoglobin: 13.7 g/dL (ref 12.0–15.0)
Lymphocytes Relative: 29.5 % (ref 12.0–46.0)
Lymphs Abs: 1.9 10*3/uL (ref 0.7–4.0)
MCHC: 33.1 g/dL (ref 30.0–36.0)
MCV: 99.6 fl (ref 78.0–100.0)
Monocytes Absolute: 0.7 10*3/uL (ref 0.1–1.0)
Monocytes Relative: 11.7 % (ref 3.0–12.0)
Neutro Abs: 3 10*3/uL (ref 1.4–7.7)
Neutrophils Relative %: 47.9 % (ref 43.0–77.0)
Platelets: 281 10*3/uL (ref 150.0–400.0)
RBC: 4.14 Mil/uL (ref 3.87–5.11)
RDW: 12.6 % (ref 11.5–15.5)
WBC: 6.3 10*3/uL (ref 4.0–10.5)

## 2020-12-30 LAB — T3, FREE: T3, Free: 2.7 pg/mL (ref 2.3–4.2)

## 2020-12-30 LAB — HEPATIC FUNCTION PANEL
ALT: 28 U/L (ref 0–35)
AST: 32 U/L (ref 0–37)
Albumin: 4.6 g/dL (ref 3.5–5.2)
Alkaline Phosphatase: 67 U/L (ref 39–117)
Bilirubin, Direct: 0.1 mg/dL (ref 0.0–0.3)
Total Bilirubin: 0.5 mg/dL (ref 0.2–1.2)
Total Protein: 7 g/dL (ref 6.0–8.3)

## 2020-12-30 LAB — URINALYSIS, ROUTINE W REFLEX MICROSCOPIC
Bilirubin Urine: NEGATIVE
Hgb urine dipstick: NEGATIVE
Ketones, ur: NEGATIVE
Leukocytes,Ua: NEGATIVE
Nitrite: NEGATIVE
RBC / HPF: NONE SEEN (ref 0–?)
Specific Gravity, Urine: <=1.005 — AB (ref 1.000–1.030)
Total Protein, Urine: NEGATIVE
Urine Glucose: NEGATIVE
Urobilinogen, UA: 0.2 (ref 0.0–1.0)
WBC, UA: NONE SEEN (ref 0–?)
pH: 6 (ref 5.0–8.0)

## 2020-12-30 LAB — BASIC METABOLIC PANEL
BUN: 16 mg/dL (ref 6–23)
CO2: 27 mEq/L (ref 19–32)
Calcium: 9.5 mg/dL (ref 8.4–10.5)
Chloride: 102 mEq/L (ref 96–112)
Creatinine, Ser: 0.83 mg/dL (ref 0.40–1.20)
GFR: 77.93 mL/min (ref >60.00)
Glucose, Bld: 88 mg/dL (ref 70–99)
Potassium: 4.5 mEq/L (ref 3.5–5.1)
Sodium: 138 mEq/L (ref 135–145)

## 2020-12-30 LAB — VITAMIN B12: Vitamin B-12: 308 pg/mL (ref 211–911)

## 2020-12-30 LAB — TSH: TSH: 1.45 u[IU]/mL (ref 0.35–5.50)

## 2020-12-30 LAB — HEMOGLOBIN A1C: Hgb A1c MFr Bld: 4.9 % (ref 4.6–6.5)

## 2020-12-30 NOTE — Progress Notes (Signed)
Patient ID: Tamara [name], female   DOB: Aug 03, 1960, 60 y.o.   MRN: [id number]         Chief Complaint:: wellness exam and Annual Exam (Pt would like to have her thyroid level checked due to her recent hair loss over the last 3 months. )         HPI:  Tamara [name] is a 60 y.o. female here for wellness exam and above, declines covid booster o/w up to date                        Also  Lost 19 lbs in the recent past intentionally with better diet, and more active. Pt denies chest pain, increased sob or doe, wheezing, orthopnea, PND, increased LE swelling, palpitations, dizziness or syncope.  Denies hyper or hypo thyroid symptoms such as voice, skin or hair change, though has had some hair thinning recently. Denies worsening depressive symptoms, suicidal ideation, or panic; has ongoing anxiety, not increased recently.    Wt Readings from Last 3 Encounters:  12/30/20 150 lb 9.6 oz (68.3 kg)  12/14/20 151 lb 9.6 oz (68.8 kg)  12/08/20 150 lb (68 kg)   BP Readings from Last 3 Encounters:  12/30/20 122/70  12/14/20 122/78  12/08/20 118/84   Immunization History  Administered Date(s) Administered   Influenza Whole 11/11/2008   Influenza,inj,Quad PF,6+ Mos 11/14/2014, 12/12/2016, 11/10/2017, 11/08/2018, 12/17/2019   Influenza-Unspecified 11/23/2020   Moderna SARS-COV2 Booster Vaccination 05/20/2020   PFIZER(Purple Top)SARS-COV-2 Vaccination 04/17/2019, 05/18/2019, 10/22/2019, 10/19/2020   Td 09/14/2000, 11/11/2008   Tdap 11/12/2018   Zoster Recombinat (Shingrix) 12/30/2020   There are no preventive care reminders to display for this patient.     Past Medical History:  Diagnosis Date   Allergic rhinitis, mild    Anemia    Depression    Hypertension    Ovarian cyst    Past Surgical History:  Procedure Laterality Date   LAMINECTOMY     LUMBAR EPIDURAL INJECTION  11/03/2014    reports that she has never smoked. She has never used smokeless tobacco. She reports current  alcohol use of about 3.0 standard drinks per week. She reports that she does not use drugs. family history includes Alcohol abuse in her father; Depression in her father and mother. Allergies  Allergen Reactions   Gabapentin Swelling   Quinolones Other (See Comments)   Ciprofloxacin Other (See Comments)    Hx of asc. Aortic dilitation.   Family hx of Marfans   Current Outpatient Medications on File Prior to Visit  Medication Sig Dispense Refill   ALPRAZolam (XANAX) 0.5 MG tablet TAKE 1 TABLET(0.5 MG) BY MOUTH THREE TIMES DAILY AS NEEDED FOR ANXIETY 90 tablet 2   cetirizine (ZYRTEC) 10 MG tablet Take 1 tablet by mouth daily.     Cholecalciferol (THERA-D 2000) 50 MCG (2000 UT) TABS 1 tab by mouth once daily 90 tablet 3   hydrOXYzine (ATARAX/VISTARIL) 10 MG tablet TAKE 1 TABLET(10 MG) BY MOUTH EVERY 8 HOURS AS NEEDED 30 tablet 0   levothyroxine (SYNTHROID) 50 MCG tablet TAKE 1 TABLET(50 MCG) BY MOUTH DAILY 30 tablet 0   losartan (COZAAR) 100 MG tablet TAKE 1 TABLET(100 MG) BY MOUTH DAILY 90 tablet 3   meloxicam (MOBIC) 15 MG tablet TAKE 1 TABLET(15 MG) BY MOUTH DAILY 90 tablet 0   Probiotic Product (ALIGN PO) Take 1 tablet by mouth daily.     tobramycin (TOBREX) 0.3 % ophthalmic solution Place 1  drop into the left eye every 6 (six) hours. 5 mL 0   venlafaxine XR (EFFEXOR-XR) 75 MG 24 hr capsule TAKE ONE CAPSULE BY MOUTH DAILY WITH BREAKFAST 90 capsule 0   No current facility-administered medications on file prior to visit.        ROS:  All others reviewed and negative.  Objective        PE:  BP 122/70   Pulse (!) 120   Resp 18   Ht 5\' 1"  (1.549 m)   Wt 150 lb 9.6 oz (68.3 kg)   LMP 06/28/2016 (Approximate)   SpO2 98%   BMI 28.46 kg/m                 Constitutional: Pt appears in NAD               HENT: Head: NCAT.                Right Ear: External ear normal.                 Left Ear: External ear normal.                Eyes: . Pupils are equal, round, and reactive to light.  Conjunctivae and EOM are normal               Nose: without d/c or deformity               Neck: Neck supple. Gross normal ROM               Cardiovascular: Normal rate and regular rhythm.                 Pulmonary/Chest: Effort normal and breath sounds without rales or wheezing.                Abd:  Soft, NT, ND, + BS, no organomegaly               Neurological: Pt is alert. At baseline orientation, motor grossly intact               Skin: Skin is warm. No rashes, no other new lesions, LE edema - none               Psychiatric: Pt behavior is normal without agitation   Micro: none  Cardiac tracings I have personally interpreted today:  none  Pertinent Radiological findings (summarize): none   Lab Results  Component Value Date   WBC 6.3 12/30/2020   HGB 13.7 12/30/2020   HCT 41.2 12/30/2020   PLT 281.0 12/30/2020   GLUCOSE 88 12/30/2020   CHOL 206 (H) 12/30/2020   TRIG 83.0 12/30/2020   HDL 104.20 12/30/2020   LDLDIRECT 89.0 01/13/2020   LDLCALC 85 12/30/2020   ALT 28 12/30/2020   AST 32 12/30/2020   NA 138 12/30/2020   K 4.5 12/30/2020   CL 102 12/30/2020   CREATININE 0.83 12/30/2020   BUN 16 12/30/2020   CO2 27 12/30/2020   TSH 1.45 12/30/2020   HGBA1C 4.9 12/30/2020   Assessment/Plan:  LAURITA PERON is a 60 y.o. White or Caucasian [1] female with  has a past medical history of Allergic rhinitis, mild, Anemia, Depression, Hypertension, and Ovarian cyst.  Vitamin D deficiency Last vitamin D Lab Results  Component Value Date   VD25OH 37.13 12/30/2020   Stable, cont oral replacement   B12 deficiency Lab Results  Component Value Date  VITAMINB12 308 12/30/2020   Stable, cont oral replacement - b12 1000 mcg qd   Essential hypertension BP Readings from Last 3 Encounters:  12/30/20 122/70  12/14/20 122/78  12/08/20 118/84   Stable, pt to continue medical treatment losartan  Preventative health care Age and sex appropriate education and counseling  updated with regular exercise and diet Referrals for preventative services - none needed Immunizations addressed - declines covid booster, but ok for shingles #1  Smoking counseling  - none needed Evidence for depression or other mood disorder - stable anxiety/depression Most recent labs reviewed. I have personally reviewed and have noted: 1) the patient's medical and social history 2) The patient's current medications and supplements 3) The patient's height, weight, and BMI have been recorded in the chart  Followup: Return in about 1 year (around 12/30/2021).  Cathlean Cower, MD 01/03/2021 6:58 AM [facility]

## 2020-12-30 NOTE — Patient Instructions (Signed)
You had the shingles shot #1 today  Please make a Nurse Visit Appt for 2 months for the second shingles shot  Please take OTC Vitamin D3 at 2000 units per day, indefinitely, or 4000 units if you already take the 2000 units.    Please also take the B12 1000 mcg per day for about 1 year  Please continue all other medications as before, and refills have been done if requested.  Please have the pharmacy call with any other refills you may need.  Please continue your efforts at being more active, low cholesterol diet, and weight control.  You are otherwise up to date with prevention measures today.  Please keep your appointments with your specialists as you may have planned  Please go to the LAB at the blood drawing area for the tests to be done  You will be contacted by phone if any changes need to be made immediately.  Otherwise, you will receive a letter about your results with an explanation, but please check with MyChart first.  Please remember to sign up for MyChart if you have not done so, as this will be important to you in the future with finding out test results, communicating by private email, and scheduling acute appointments online when needed.  Please make an Appointment to return for your 1 year visit, or sooner if needed, with Lab testing at the [facility] lab  a few days ahead next year

## 2021-01-03 ENCOUNTER — Encounter: Payer: Self-pay | Admitting: Internal Medicine

## 2021-01-03 NOTE — Assessment & Plan Note (Addendum)
Age and sex appropriate education and counseling updated with regular exercise and diet Referrals for preventative services - none needed Immunizations addressed - declines covid booster, but ok for shingles #1  Smoking counseling  - none needed Evidence for depression or other mood disorder - stable anxiety/depression Most recent labs reviewed. I have personally reviewed and have noted: 1) the patient's medical and social history 2) The patient's current medications and supplements 3) The patient's height, weight, and BMI have been recorded in the chart

## 2021-01-03 NOTE — Assessment & Plan Note (Signed)
Last vitamin D Lab Results  Component Value Date   VD25OH 37.13 12/30/2020   Stable, cont oral replacement

## 2021-01-03 NOTE — Assessment & Plan Note (Signed)
Lab Results  Component Value Date   VITAMINB12 308 12/30/2020   Stable, cont oral replacement - b12 1000 mcg qd

## 2021-01-03 NOTE — Assessment & Plan Note (Signed)
BP Readings from Last 3 Encounters:  12/30/20 122/70  12/14/20 122/78  12/08/20 118/84   Stable, pt to continue medical treatment losartan

## 2021-01-06 ENCOUNTER — Other Ambulatory Visit: Payer: Self-pay | Admitting: Family Medicine

## 2021-01-08 ENCOUNTER — Other Ambulatory Visit: Payer: Self-pay | Admitting: Internal Medicine

## 2021-01-28 ENCOUNTER — Encounter: Payer: Self-pay | Admitting: Internal Medicine

## 2021-02-02 ENCOUNTER — Ambulatory Visit: Payer: 59 | Admitting: Family Medicine

## 2021-02-05 ENCOUNTER — Other Ambulatory Visit: Payer: Self-pay | Admitting: Family Medicine

## 2021-02-17 NOTE — Progress Notes (Signed)
[facility] [place] [address] [place] Phone: [phone] Subjective:   IVilma Meckel, am serving as a scribe for Dr. Hulan Saas. This visit occurred during the SARS-CoV-2 public health emergency.  Safety protocols were in place, including screening questions prior to the visit, additional usage of staff PPE, and extensive cleaning of exam room while observing appropriate contact time as indicated for disinfecting solutions.   I'm seeing this patient by the request  of:  [name], MD  CC: low back exam  [id number]  Tamara [name] is a 61 y.o. female coming in with complaint of back and neck pain. OMT on 12/08/2020. Patient states doing well. Right shoulder pain on the anterior side that hurts with palpation and certain movements like abduction.  Strong family history of multiple aortic aneurysms and patient is scheduled for another echocardiogram next year.  Patient's debilitation at this moment is on the upper end of normal.  Medications patient has been prescribed: Meloxicam and Synthroid         Reviewed prior external information including notes and imaging from previsou exam, outside providers and external EMR if available.   As well as notes that were available from care everywhere and other healthcare systems.  Reviewed patient's cardiology visit recently as well as primary care visit in November.  Past medical history, social, surgical and family history all reviewed in electronic medical record.  No pertanent information unless stated regarding to the chief complaint.   Past Medical History:  Diagnosis Date   Allergic rhinitis, mild    Anemia    Depression    Hypertension    Ovarian cyst     Allergies  Allergen Reactions   Gabapentin Swelling   Quinolones Other (See Comments)   Ciprofloxacin Other (See Comments)    Hx of asc. Aortic dilitation.   Family hx of Marfans     Review of Systems:  No  headache, visual changes, nausea, vomiting, diarrhea, constipation, dizziness, abdominal pain, skin rash, fevers, chills, night sweats, weight loss, swollen lymph nodes, body aches, joint swelling, chest pain, shortness of breath, mood changes. POSITIVE muscle aches  Objective  Blood pressure 122/84, pulse (!) 119, height 5\' 1"  (1.549 m), weight 151 lb (68.5 kg), last menstrual period 06/28/2016, SpO2 99 %.   General: No apparent distress alert and oriented x3 mood and affect normal, dressed appropriately.  HEENT: Pupils equal, extraocular movements intact  Respiratory: Patient's speak in full sentences and does not appear short of breath  Cardiovascular: No lower extremity edema, non tender, no erythema  Back - low back exam shows some loss of lordosis.  Patient does have some limited rotation of the neck also noted today.  Patient does have tightness with FABER test.  No radicular symptoms noted today.  Osteopathic findings  C2 flexed rotated and side bent right C5 flexed rotated and side bent left T3 extended rotated and side bent right inhaled rib L2 flexed rotated and side bent right L5 flexed rotated and side bent left Sacrum right on right       Assessment and Plan:  Lumbar radiculopathy Likely no significant radicular symptoms at the moment.  Discussed with patient to continue to stay active.  Patient has been doing much better since she has been taking a probiotic on a regular basis.  Follow-up with me again in 6 to 8 weeks    Nonallopathic problems  Decision today to treat with OMT was based on Physical  Exam  After verbal consent patient was treated with HVLA, ME, FPR techniques in cervical, rib, thoracic, lumbar, and sacral  areas  Patient tolerated the procedure well with improvement in symptoms  Patient given exercises, stretches and lifestyle modifications  See medications in patient instructions if given  Patient will follow up in 4-8 weeks      The above  documentation has been reviewed and is accurate and complete [name], DO        Note: This dictation was prepared with Dragon dictation along with smaller phrase technology. Any transcriptional errors that result from this process are unintentional.

## 2021-02-18 ENCOUNTER — Other Ambulatory Visit: Payer: Self-pay

## 2021-02-18 ENCOUNTER — Ambulatory Visit: Payer: 59 | Admitting: Family Medicine

## 2021-02-18 VITALS — BP 122/84 | HR 119 | Ht 61.0 in | Wt 151.0 lb

## 2021-02-18 DIAGNOSIS — M9903 Segmental and somatic dysfunction of lumbar region: Secondary | ICD-10-CM

## 2021-02-18 DIAGNOSIS — M5416 Radiculopathy, lumbar region: Secondary | ICD-10-CM | POA: Diagnosis not present

## 2021-02-18 DIAGNOSIS — M9901 Segmental and somatic dysfunction of cervical region: Secondary | ICD-10-CM | POA: Diagnosis not present

## 2021-02-18 DIAGNOSIS — M9904 Segmental and somatic dysfunction of sacral region: Secondary | ICD-10-CM

## 2021-02-18 DIAGNOSIS — M9908 Segmental and somatic dysfunction of rib cage: Secondary | ICD-10-CM

## 2021-02-18 DIAGNOSIS — M9902 Segmental and somatic dysfunction of thoracic region: Secondary | ICD-10-CM | POA: Diagnosis not present

## 2021-02-18 NOTE — Patient Instructions (Signed)
Good to see you! Thanks for showing me picture See you again in 6 weeks

## 2021-02-19 NOTE — Assessment & Plan Note (Signed)
Likely no significant radicular symptoms at the moment.  Discussed with patient to continue to stay active.  Patient has been doing much better since she has been taking a probiotic on a regular basis.  Follow-up with me again in 6 to 8 weeks

## 2021-03-07 ENCOUNTER — Other Ambulatory Visit: Payer: Self-pay | Admitting: Family Medicine

## 2021-03-08 ENCOUNTER — Other Ambulatory Visit: Payer: Self-pay | Admitting: Internal Medicine

## 2021-03-11 ENCOUNTER — Ambulatory Visit: Payer: 59

## 2021-03-30 NOTE — Progress Notes (Signed)
Tamara [name] [name] [place] Phone: [phone] Subjective:   Tamara [name], am serving as a scribe for Dr. Hulan [name]. This visit occurred during the SARS-CoV-2 public health emergency.  Safety protocols were in place, including screening questions prior to the visit, additional usage of staff PPE, and extensive cleaning of exam room while observing appropriate contact time as indicated for disinfecting solutions.   I'm seeing this patient by the request  of:  Tamara Borg, MD  CC: Back and neck pain follow-up  [id number]  Tamara [name] is a 61 y.o. female coming in with complaint of back and neck pain. OMT on 02/18/2021. Patient states back is doing well. Neck is tight. Right shoulder is hurting more. Discomfort in right ankle. Doesn't remember a MOI. Questions about medications. No other complaints.  Medications patient has been prescribed: Synthroid and Mobic  Taking:         Reviewed prior external information including notes and imaging from previsou exam, outside providers and external EMR if available.   As well as notes that were available from care everywhere and other healthcare systems.  Past medical history, social, surgical and family history all reviewed in electronic medical record.  No pertanent information unless stated regarding to the chief complaint.   Past Medical History:  Diagnosis Date   Allergic rhinitis, mild    Anemia    Depression    Hypertension    Ovarian cyst     Allergies  Allergen Reactions   Gabapentin Swelling   Quinolones Other (See Comments)   Ciprofloxacin Other (See Comments)    Hx of asc. Aortic dilitation.   Family hx of Marfans     Review of Systems:  No headache, visual changes, nausea, vomiting, diarrhea, constipation, dizziness, abdominal pain, skin rash, fevers, chills, night sweats, weight loss, swollen lymph nodes, body aches, joint swelling, chest pain,  shortness of breath, mood changes. POSITIVE muscle aches  Objective  Blood pressure 120/78, pulse (!) 109, height 5\' 1"  (1.549 m), weight 148 lb (67.1 kg), last menstrual period 06/28/2016, SpO2 98 %.   General: No apparent distress alert and oriented x3 mood and affect normal, dressed appropriately.  HEENT: Pupils equal, extraocular movements intact  Respiratory: Patient's speak in full sentences and does not appear short of breath  Cardiovascular: No lower extremity edema, non tender, no erythema  Neck exam mild loss of lordosis.  Mild decrease in sidebending especially to the right side.  Negative Spurling's.  5/5 strength of the upper extremities.  He does have some tightness across the shoulder blades bilaterally right greater than left.  Patient also has some tightness in the lower back.  Lacks last 10 degrees of extension.  Osteopathic findings  C2 flexed rotated and side bent right C6 flexed rotated and side bent left T3 extended rotated and side bent right inhaled rib T8 extended rotated and side bent left L2 flexed rotated and side bent right Sacrum right on right       Assessment and Plan:  Stenosis of lateral recess of lumbar spine Chronic problem and actually seems to be doing relatively well at the moment.  The patient has been able to avoid any type of surgical intervention at the moment.  The patient does not have any significant radiation down the legs.  Discussed icing regimen and home exercises, discussed which activities to do and which ones to avoid.  Patient has meloxicam for breakthrough and has been  using the hydroxyzine more for sleep and mild situational anxiety.  We will refill it at a higher dose.  Follow-up with me again 6 weeks    Nonallopathic problems  Decision today to treat with OMT was based on Physical Exam  After verbal consent patient was treated with HVLA, ME, FPR techniques in cervical, rib, thoracic, lumbar, and sacral  areas  Patient  tolerated the procedure well with improvement in symptoms  Patient given exercises, stretches and lifestyle modifications  See medications in patient instructions if given  Patient will follow up in 6weeks      The above documentation has been reviewed and is accurate and complete Tamara Pulley, DO        Note: This dictation was prepared with Dragon dictation along with smaller phrase technology. Any transcriptional errors that result from this process are unintentional.

## 2021-03-31 ENCOUNTER — Ambulatory Visit: Payer: 59 | Admitting: Family Medicine

## 2021-03-31 ENCOUNTER — Other Ambulatory Visit: Payer: Self-pay

## 2021-03-31 VITALS — BP 120/78 | HR 109 | Ht 61.0 in | Wt 148.0 lb

## 2021-03-31 DIAGNOSIS — M9901 Segmental and somatic dysfunction of cervical region: Secondary | ICD-10-CM | POA: Diagnosis not present

## 2021-03-31 DIAGNOSIS — M9904 Segmental and somatic dysfunction of sacral region: Secondary | ICD-10-CM

## 2021-03-31 DIAGNOSIS — M48061 Spinal stenosis, lumbar region without neurogenic claudication: Secondary | ICD-10-CM

## 2021-03-31 DIAGNOSIS — M9908 Segmental and somatic dysfunction of rib cage: Secondary | ICD-10-CM

## 2021-03-31 DIAGNOSIS — M9903 Segmental and somatic dysfunction of lumbar region: Secondary | ICD-10-CM

## 2021-03-31 DIAGNOSIS — M9902 Segmental and somatic dysfunction of thoracic region: Secondary | ICD-10-CM

## 2021-03-31 NOTE — Patient Instructions (Signed)
Good to see you! Wish you the best your dad See you again in 6 weeks

## 2021-04-01 NOTE — Assessment & Plan Note (Signed)
Chronic problem and actually seems to be doing relatively well at the moment.  The patient has been able to avoid any type of surgical intervention at the moment.  The patient does not have any significant radiation down the legs.  Discussed icing regimen and home exercises, discussed which activities to do and which ones to avoid.  Patient has meloxicam for breakthrough and has been using the hydroxyzine more for sleep and mild situational anxiety.  We will refill it at a higher dose.  Follow-up with me again 6 weeks

## 2021-04-05 ENCOUNTER — Other Ambulatory Visit: Payer: Self-pay | Admitting: Family Medicine

## 2021-04-07 ENCOUNTER — Ambulatory Visit (INDEPENDENT_AMBULATORY_CARE_PROVIDER_SITE_OTHER): Payer: 59

## 2021-04-07 ENCOUNTER — Other Ambulatory Visit: Payer: Self-pay

## 2021-04-07 ENCOUNTER — Ambulatory Visit: Payer: 59

## 2021-04-07 DIAGNOSIS — Z23 Encounter for immunization: Secondary | ICD-10-CM

## 2021-04-07 NOTE — Progress Notes (Signed)
Pt has been given 2nd Shingles vacc w/o any complications.

## 2021-04-21 ENCOUNTER — Other Ambulatory Visit: Payer: Self-pay

## 2021-04-21 ENCOUNTER — Encounter: Payer: Self-pay | Admitting: Family Medicine

## 2021-04-21 ENCOUNTER — Other Ambulatory Visit: Payer: Self-pay | Admitting: Family Medicine

## 2021-04-21 MED ORDER — HYDROXYZINE HCL 10 MG PO TABS: ORAL_TABLET | ORAL | 0 refills | Status: DC

## 2021-04-27 ENCOUNTER — Other Ambulatory Visit: Payer: Self-pay | Admitting: Internal Medicine

## 2021-04-27 DIAGNOSIS — Z1231 Encounter for screening mammogram for malignant neoplasm of breast: Secondary | ICD-10-CM

## 2021-04-28 ENCOUNTER — Other Ambulatory Visit: Payer: Self-pay

## 2021-04-28 MED ORDER — HYDROXYZINE HCL 10 MG PO TABS: ORAL_TABLET | ORAL | 0 refills | Status: DC

## 2021-05-03 ENCOUNTER — Other Ambulatory Visit: Payer: Self-pay | Admitting: Family Medicine

## 2021-05-04 NOTE — Progress Notes (Signed)
?Tamara [name] D.O. ?[facility] ?[place] ?Phone: [phone] ?Subjective:   ?I, Tamara [name], am serving as a scribe for Dr. Hulan Saas. ? ?This visit occurred during the SARS-CoV-2 public health emergency.  Safety protocols were in place, including screening questions prior to the visit, additional usage of staff PPE, and extensive cleaning of exam room while observing appropriate contact time as indicated for disinfecting solutions.  ? ? ?I'm seeing this patient by the request  of:  [name], MD ? ?CC: back and neck pain  ? ?[id number]  ?Tamara [name] is a 61 y.o. female coming in with complaint of back and neck pain. OMT on 03/31/2021. Patient states that pain in R shoulder is less. Pain is present in spine but is better than it has been in the past.  ? ?Also starting to have pain in R CMC joint. Sharp pain will occur intermittently with use or at rest.  ? ? ?Medications patient has been prescribed: Meloxicam, hydroxyzine, synthroid ? ?Taking: yes ? ? ?  ? ? ? ? ?Reviewed prior external information including notes and imaging from previsou exam, outside providers and external EMR if available.  ? ?As well as notes that were available from care everywhere and other healthcare systems. ? ?Past medical history, social, surgical and family history all reviewed in electronic medical record.  No pertanent information unless stated regarding to the chief complaint.  ? ?Past Medical History:  ?Diagnosis Date  ? Allergic rhinitis, mild   ? Anemia   ? Depression   ? Hypertension   ? Ovarian cyst   ?  ?Allergies  ?Allergen Reactions  ? Gabapentin Swelling  ? Quinolones Other (See Comments)  ? Ciprofloxacin Other (See Comments)  ?  Hx of asc. Aortic dilitation.   Family hx of Marfans  ? ? ? ?Review of Systems: ? No headache, visual changes, nausea, vomiting, diarrhea, constipation, dizziness, abdominal pain, skin rash, fevers, chills, night sweats, weight loss,  swollen lymph nodes, body aches, joint swelling, chest pain, shortness of breath, mood changes. POSITIVE muscle aches ? ?Objective  ?Blood pressure 124/88, pulse 88, height '5\' 1"'$  (1.549 m), weight 147 lb (66.7 kg), last menstrual period 06/28/2016. ?  ?General: No apparent distress alert and oriented x3 mood and affect normal, dressed appropriately.  ?HEENT: Pupils equal, extraocular movements intact  ?Respiratory: Patient's speak in full sentences and does not appear short of breath  ?Cardiovascular: No lower extremity edema, non tender, no erythema  ?Neck exam still has some loss of lordosis.  Tightness with sidebending bilaterally.  Mild crepitus noted.  Negative Spurling's. ?Low back exam also has a loss of lordosis.  Tenderness to palpation in the paraspinal musculature.  Negative straight leg test but tightness with hamstrings. ? ?Osteopathic findings ? ?C2 flexed rotated and side bent right ?C6 flexed rotated and side bent left ?T3 extended rotated and side bent right inhaled rib ?T9 extended rotated and side bent left ?L2 flexed rotated and side bent right ?Sacrum right on right ? ? ?  ?Assessment and Plan: ? ?Stenosis of lateral recess of lumbar spine ?Chronic overall states doing better, responding well to osteopathic manipulation.  Has meloxicam for breakthrough pain.  Does have underlying arthritis that we will continue to monitor.  Follow-up with me again in 6 to 8 weeks. ?  ? ?Nonallopathic problems ? ?Decision today to treat with OMT was based on Physical Exam ? ?After verbal consent patient was treated with HVLA,  ME, FPR techniques in cervical, rib, thoracic, lumbar, and sacral  areas ? ?Patient tolerated the procedure well with improvement in symptoms ? ?Patient given exercises, stretches and lifestyle modifications ? ?See medications in patient instructions if given ? ?Patient will follow up in 4-8 weeks ? ?  ? ? ?The above documentation has been reviewed and is accurate and complete Tamara Pulley, DO ? ? ? ?  ? ? Note: This dictation was prepared with Dragon dictation along with smaller phrase technology. Any transcriptional errors that result from this process are unintentional.    ?  ?  ? ?

## 2021-05-05 ENCOUNTER — Other Ambulatory Visit: Payer: Self-pay

## 2021-05-05 ENCOUNTER — Ambulatory Visit: Payer: 59 | Admitting: Family Medicine

## 2021-05-05 ENCOUNTER — Encounter: Payer: Self-pay | Admitting: Family Medicine

## 2021-05-05 VITALS — BP 124/88 | HR 88 | Ht 61.0 in | Wt 147.0 lb

## 2021-05-05 DIAGNOSIS — M9908 Segmental and somatic dysfunction of rib cage: Secondary | ICD-10-CM | POA: Diagnosis not present

## 2021-05-05 DIAGNOSIS — M9901 Segmental and somatic dysfunction of cervical region: Secondary | ICD-10-CM | POA: Diagnosis not present

## 2021-05-05 DIAGNOSIS — M9902 Segmental and somatic dysfunction of thoracic region: Secondary | ICD-10-CM | POA: Diagnosis not present

## 2021-05-05 DIAGNOSIS — M9904 Segmental and somatic dysfunction of sacral region: Secondary | ICD-10-CM | POA: Diagnosis not present

## 2021-05-05 DIAGNOSIS — M48061 Spinal stenosis, lumbar region without neurogenic claudication: Secondary | ICD-10-CM | POA: Diagnosis not present

## 2021-05-05 DIAGNOSIS — M9903 Segmental and somatic dysfunction of lumbar region: Secondary | ICD-10-CM

## 2021-05-05 NOTE — Patient Instructions (Signed)
I am so happy [facility] help you finally with Truffle.  ?Keep it up otherwise  ?See me again in 6-8 weeks! ?

## 2021-05-05 NOTE — Assessment & Plan Note (Signed)
Chronic overall states doing better, responding well to osteopathic manipulation.  Has meloxicam for breakthrough pain.  Does have underlying arthritis that we will continue to monitor.  Follow-up with me again in 6 to 8 weeks. ?

## 2021-05-20 ENCOUNTER — Ambulatory Visit
Admission: RE | Admit: 2021-05-20 | Discharge: 2021-05-20 | Disposition: A | Discharge status: Home / Self Care | Payer: 59 | Source: Ambulatory Visit | Attending: Internal Medicine | Admitting: Internal Medicine

## 2021-05-20 DIAGNOSIS — Z1231 Encounter for screening mammogram for malignant neoplasm of breast: Secondary | ICD-10-CM

## 2021-05-24 ENCOUNTER — Encounter: Payer: Self-pay | Admitting: Family Medicine

## 2021-05-24 ENCOUNTER — Other Ambulatory Visit: Payer: Self-pay | Admitting: Family Medicine

## 2021-06-13 IMAGING — DX DG SHOULDER 2+V*L*
3 series · 3 of 3 positions shown · non-contrast
Comparison: 11/11/2008

CLINICAL DATA: Chronic shoulder pain

EXAM:
LEFT SHOULDER - 2+ VIEW

[shoulder ap (1 of 2)]
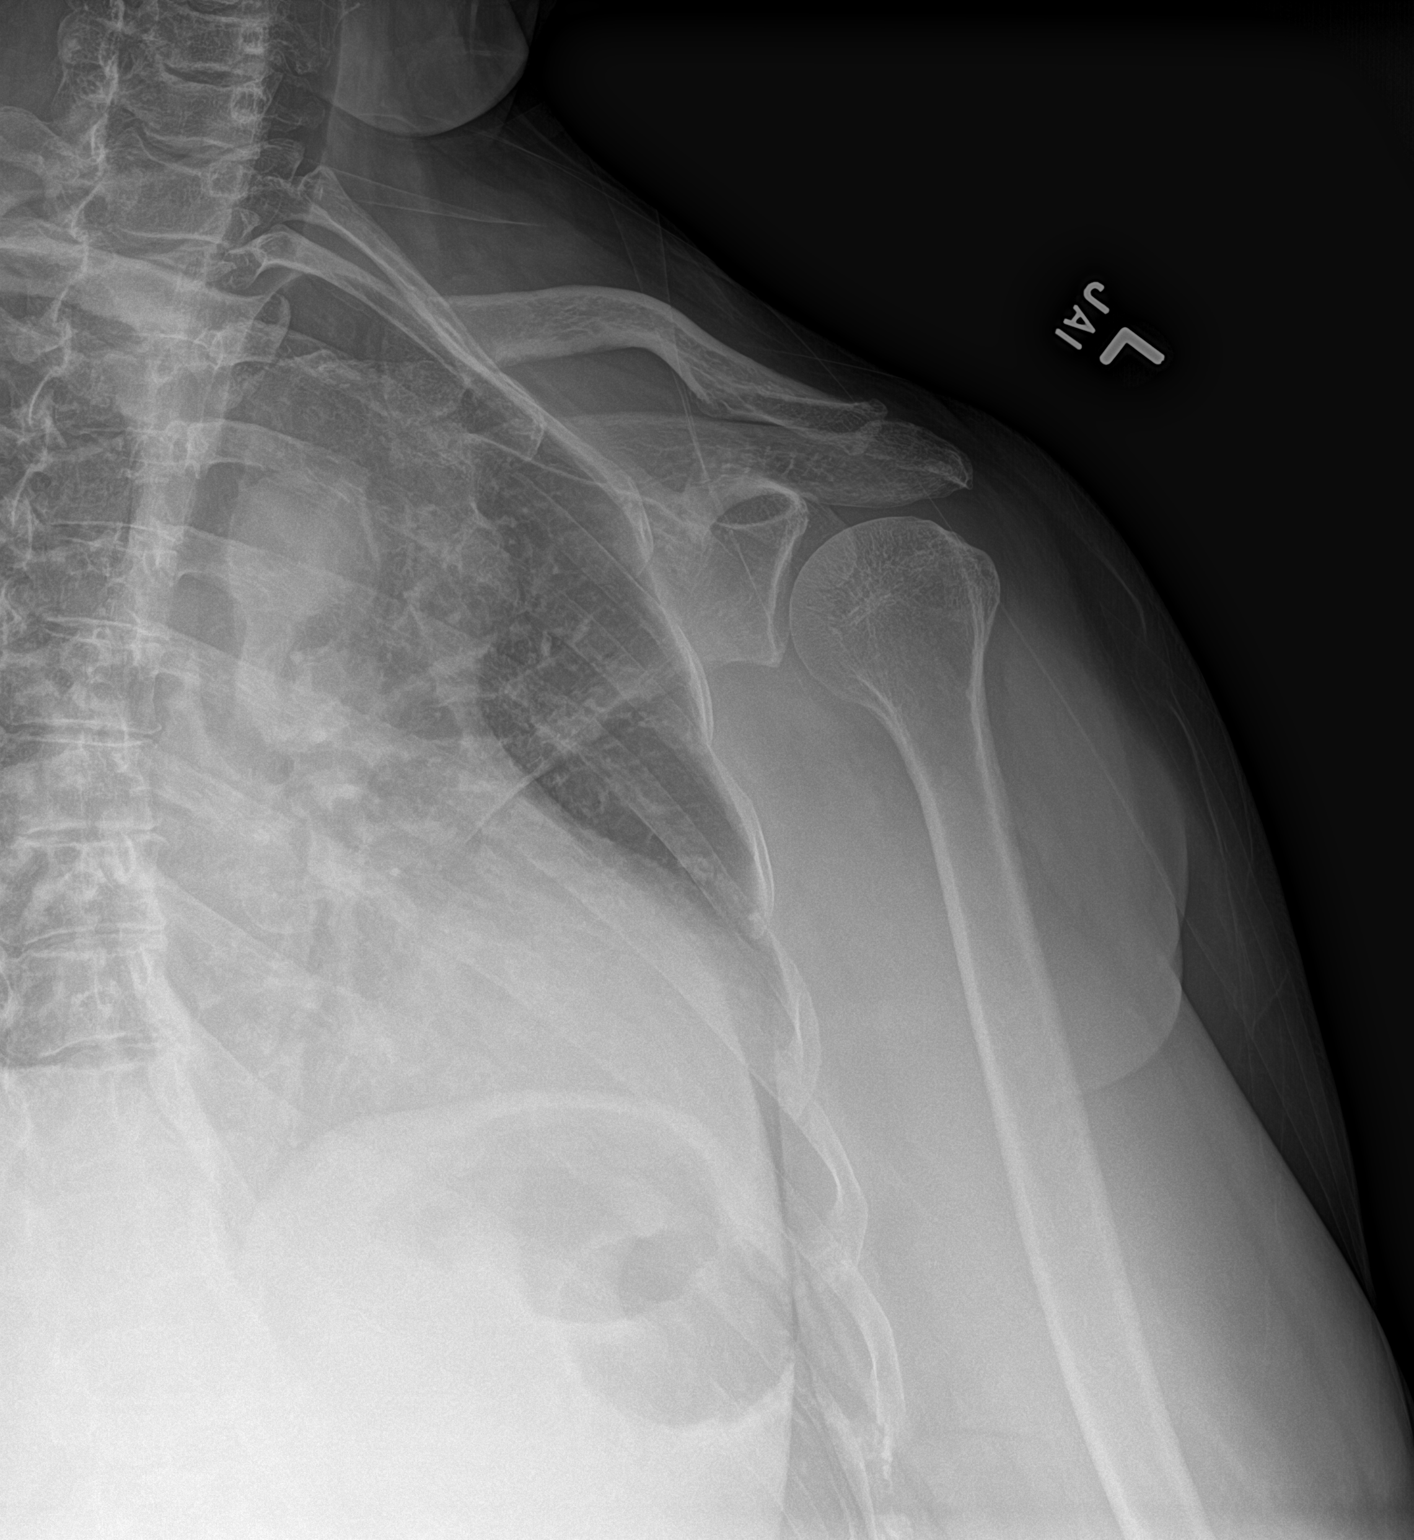

[shoulder ap (2 of 2)]
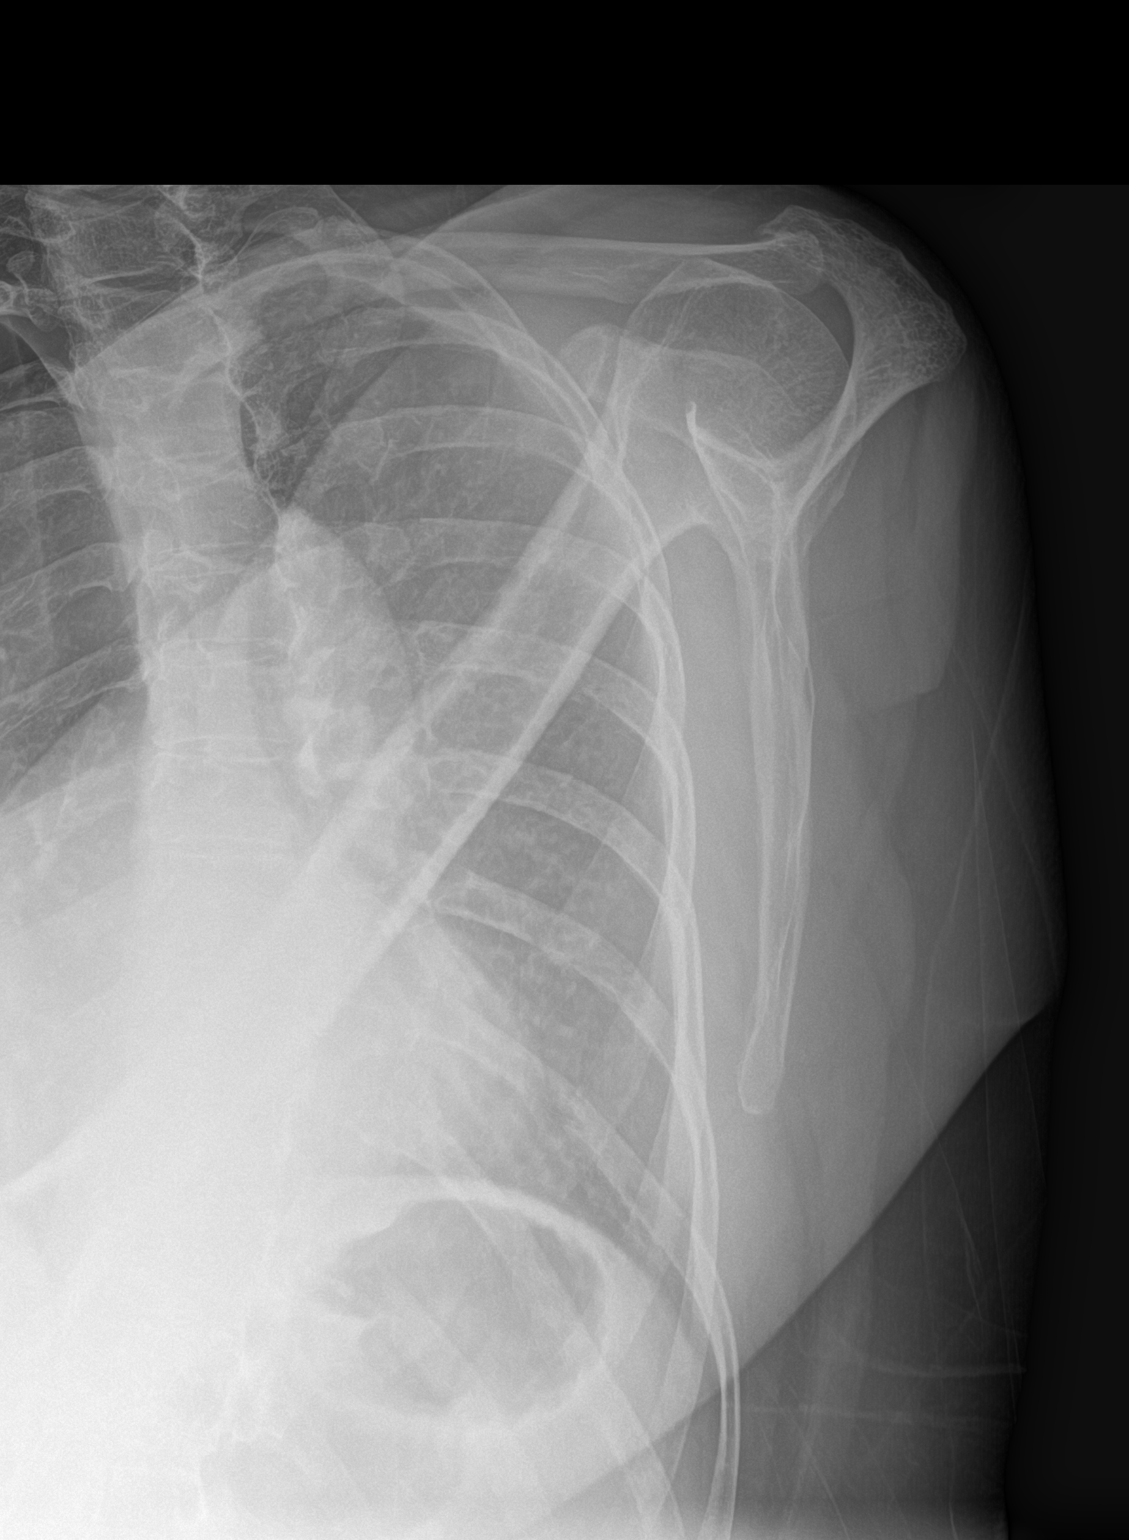

[shoulder axial]
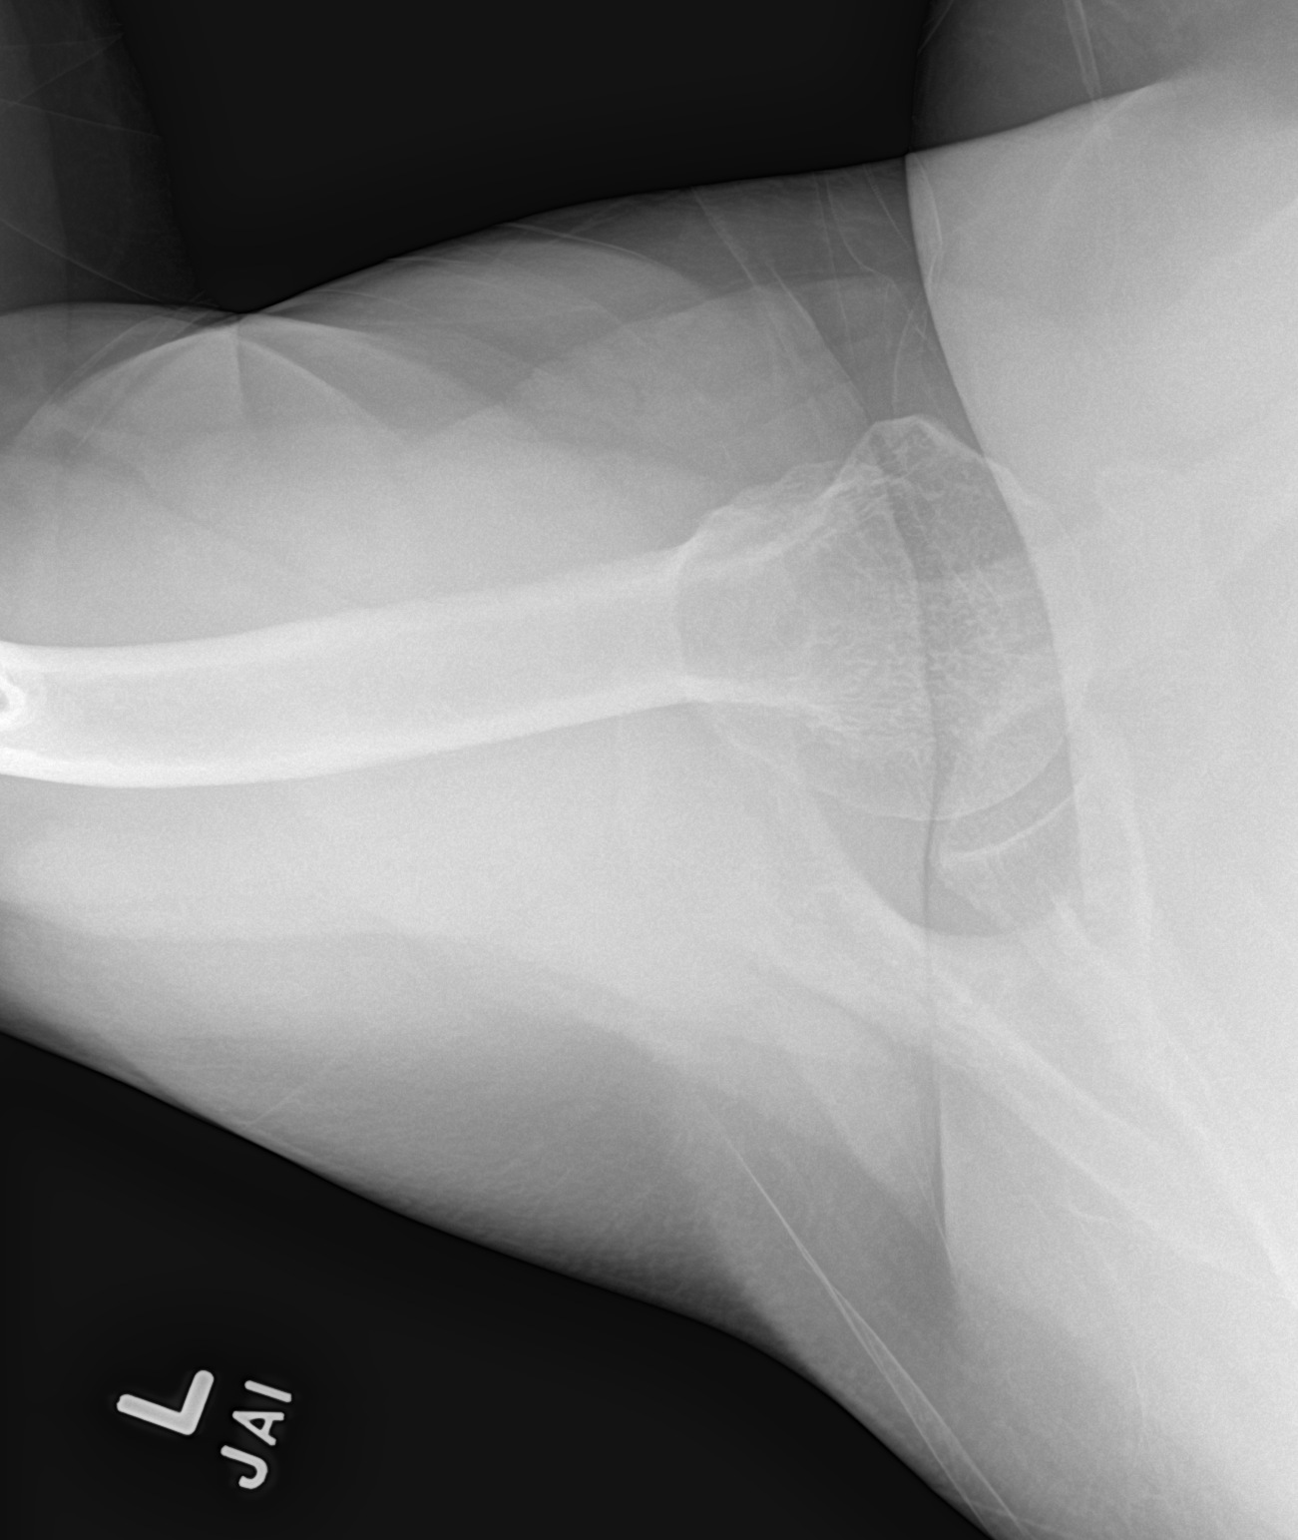

[3 of 3 positions shown; findings below may reference images not displayed]

FINDINGS: There is no evidence of fracture or dislocation. There is no
evidence of arthropathy or other focal bone abnormality. Soft
tissues are unremarkable.
IMPRESSION: Negative.

## 2021-06-13 IMAGING — DX DG SHOULDER 2+V*R*
3 series · 3 of 3 positions shown · non-contrast
Comparison: None.

CLINICAL DATA: Right-sided shoulder pain

EXAM:
RIGHT SHOULDER - 2+ VIEW

[shoulder ap (1 of 2)]
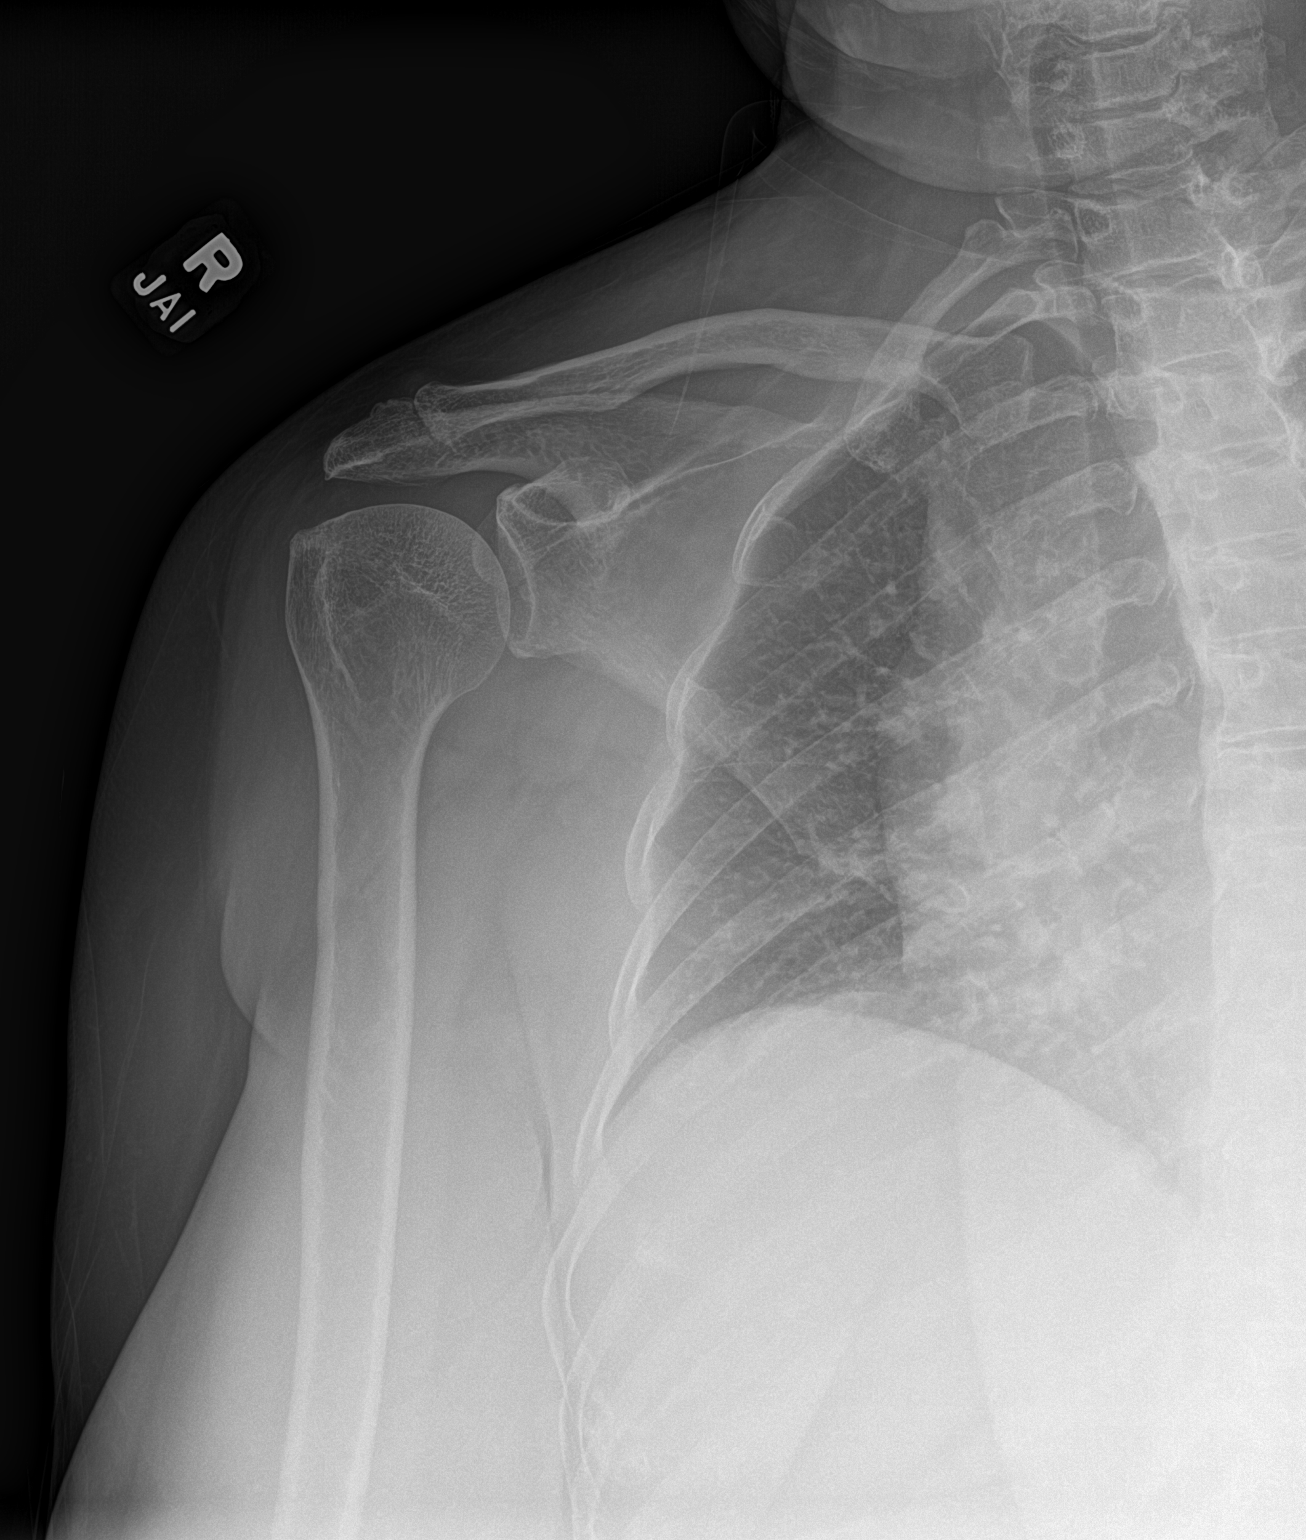

[shoulder ap (2 of 2)]
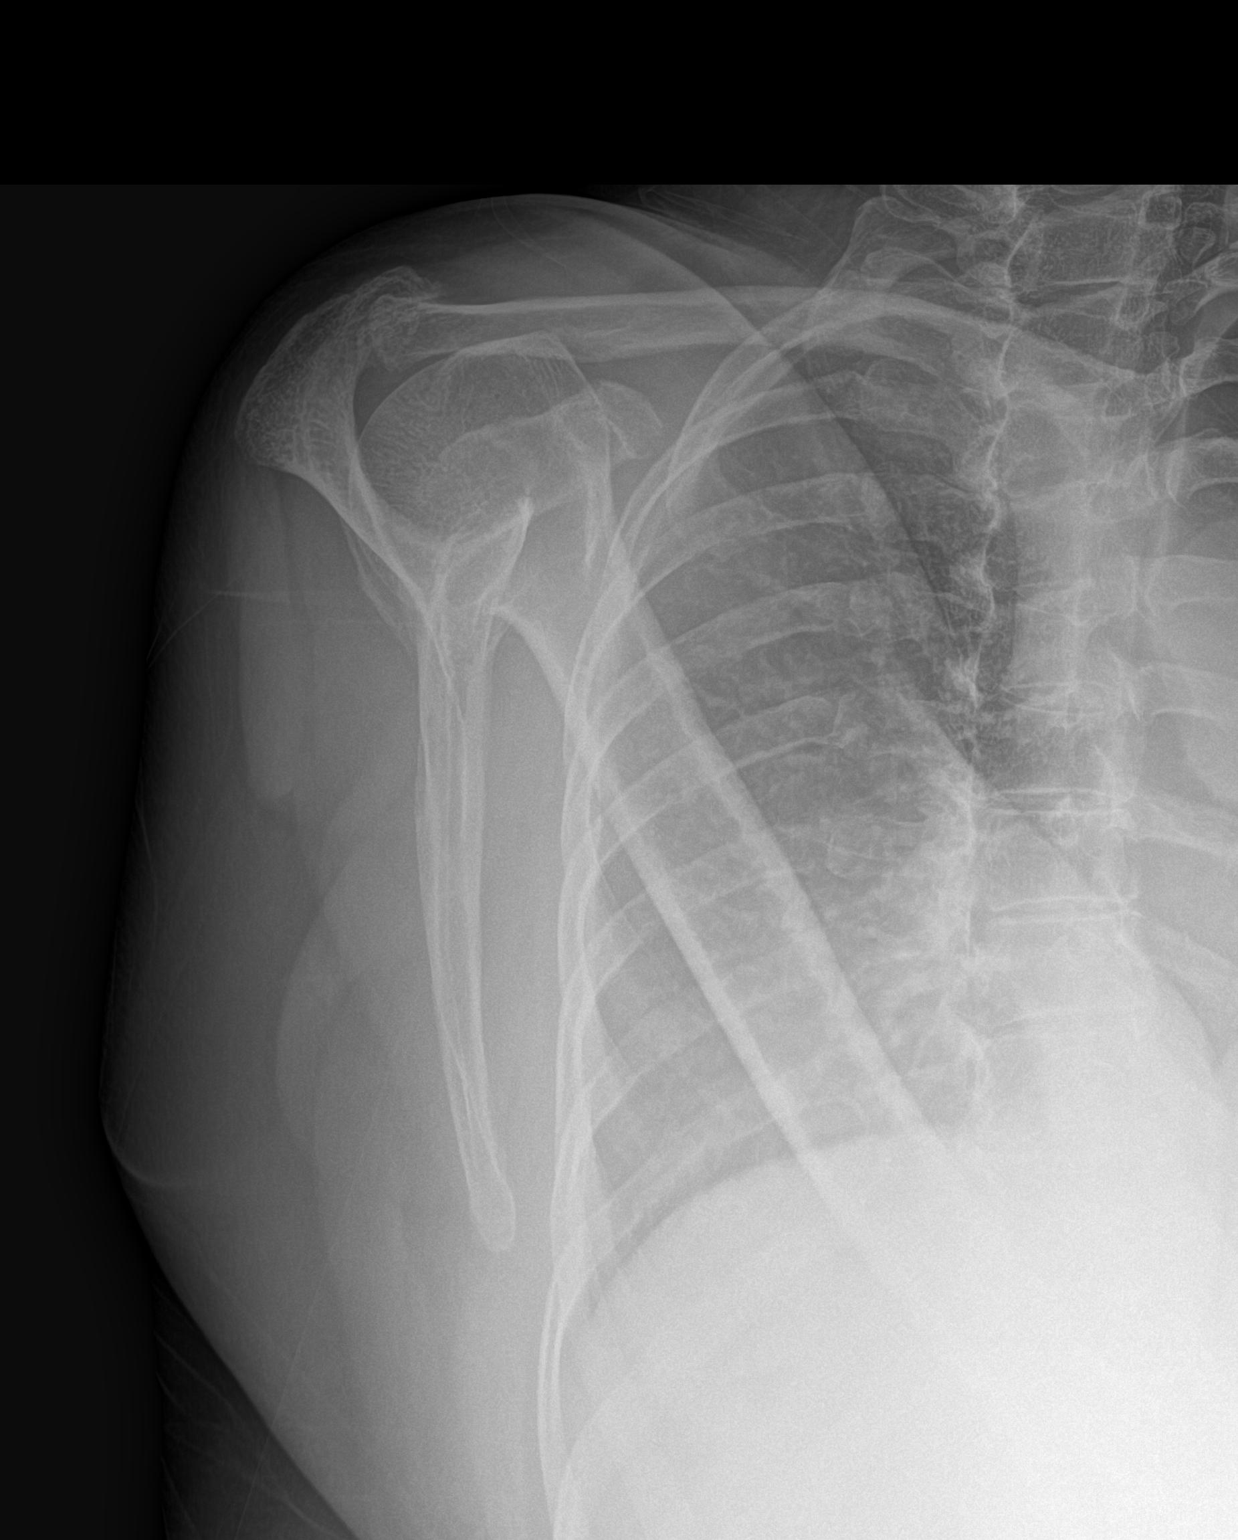

[shoulder axial]
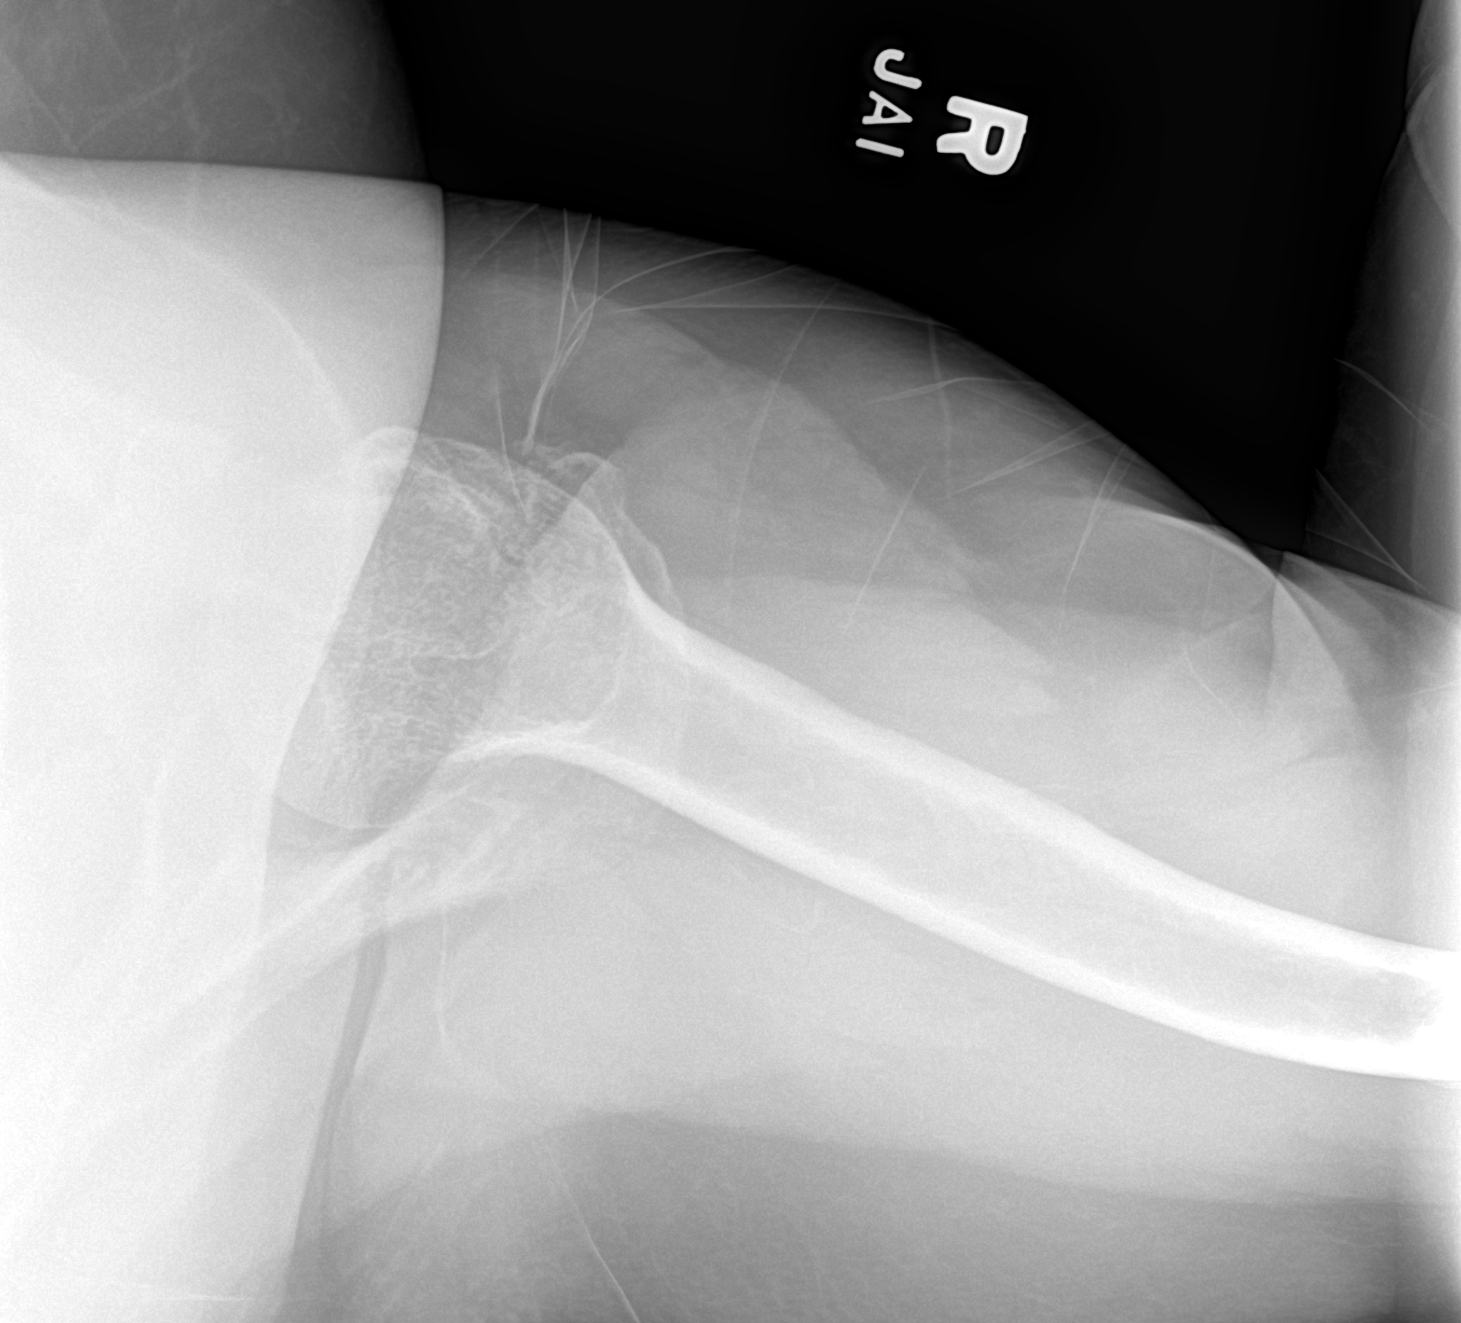

[3 of 3 positions shown; findings below may reference images not displayed]

FINDINGS: Mild AC joint degenerative change. No fracture or malalignment.
Visible right lung is clear.
IMPRESSION: Mild AC joint degenerative change.

## 2021-06-18 ENCOUNTER — Other Ambulatory Visit: Payer: Self-pay | Admitting: Family Medicine

## 2021-06-18 NOTE — Progress Notes (Signed)
?Charlann Boxer D.O. ?[facility] ?[place] ?Phone: [phone] ?Subjective:   ?I, Jacqualin Combes, am serving as a scribe for Dr. Hulan Saas. ? ?This visit occurred during the SARS-CoV-2 public health emergency.  Safety protocols were in place, including screening questions prior to the visit, additional usage of staff PPE, and extensive cleaning of exam room while observing appropriate contact time as indicated for disinfecting solutions.  ? ?I'm seeing this patient by the request  of:  [name], MD ? ?CC: back and neck pain  ? ?[id number]  ?NALANY STEEDLEY is a 61 y.o. female coming in with complaint of back and neck pain. OMT on 05/05/2021. Patient states that she has not had any issues since last visit.  ? ?Medications patient has been prescribed: meloxicam, hydroxyzine, synthroid ? ?Taking: ? ? ?  ? ? ? ? ?Reviewed prior external information including notes and imaging from previsou exam, outside providers and external EMR if available.  ? ?As well as notes that were available from care everywhere and other healthcare systems. ? ?Past medical history, social, surgical and family history all reviewed in electronic medical record.  No pertanent information unless stated regarding to the chief complaint.  ? ?Past Medical History:  ?Diagnosis Date  ? Allergic rhinitis, mild   ? Anemia   ? Depression   ? Hypertension   ? Ovarian cyst   ?  ?Allergies  ?Allergen Reactions  ? Gabapentin Swelling  ? Quinolones Other (See Comments)  ? Ciprofloxacin Other (See Comments)  ?  Hx of asc. Aortic dilitation.   Family hx of Marfans  ? ? ? ?Review of Systems: ? No headache, visual changes, nausea, vomiting, diarrhea, constipation, dizziness, abdominal pain, skin rash, fevers, chills, night sweats, weight loss, swollen lymph nodes, body aches, joint swelling, chest pain, shortness of breath, mood changes. POSITIVE muscle aches ? ?Objective  ?Blood pressure 112/82, pulse 99,  height '5\' 1"'$  (1.549 m), weight 142 lb (64.4 kg), last menstrual period 06/28/2016, SpO2 99 %. ?  ?General: No apparent distress alert and oriented x3 mood and affect normal, dressed appropriately.  ?HEENT: Pupils equal, extraocular movements intact  ?Respiratory: Patient's speak in full sentences and does not appear short of breath  ?Cardiovascular: No lower extremity edema, non tender, no erythema  ?Patient does have some loss of lordosis of the cervical spine.  Some limited sidebending bilaterally.  Mild tightness with FABER test right greater than left.  Negative straight leg test. ? ?Osteopathic findings ? ?C2 flexed rotated and side bent right ?C5 flexed rotated and side bent left ?T3 extended rotated and side bent left inhaled rib ?L2 flexed rotated and side bent right ?L4 flexed rotated and side bent left ?Sacrum right on right ? ? ? ? ?  ?Assessment and Plan: ? ?Stenosis of lateral recess of lumbar spine ?Mild loss of lordosis  ?Discussed home exercises.  ?meloxicam as needed ?Doing well with the probiotic  ?  ? ?Nonallopathic problems ? ?Decision today to treat with OMT was based on Physical Exam ? ?After verbal consent patient was treated with HVLA, ME, FPR techniques in cervical, rib, thoracic, lumbar, and sacral  areas ? ?Patient tolerated the procedure well with improvement in symptoms ? ?Patient given exercises, stretches and lifestyle modifications ? ?See medications in patient instructions if given ? ?Patient will follow up in 4-8 weeks ? ?  ? ? ?The above documentation has been reviewed and is accurate and complete [name],  DO ? ? ? ?  ? ? Note: This dictation was prepared with Dragon dictation along with smaller phrase technology. Any transcriptional errors that result from this process are unintentional.    ?  ?  ? ?

## 2021-06-19 ENCOUNTER — Other Ambulatory Visit: Payer: Self-pay | Admitting: Family Medicine

## 2021-06-22 ENCOUNTER — Encounter: Payer: Self-pay | Admitting: Family Medicine

## 2021-06-22 ENCOUNTER — Ambulatory Visit: Payer: 59 | Admitting: Family Medicine

## 2021-06-22 VITALS — BP 112/82 | HR 99 | Ht 61.0 in | Wt 142.0 lb

## 2021-06-22 DIAGNOSIS — M9904 Segmental and somatic dysfunction of sacral region: Secondary | ICD-10-CM

## 2021-06-22 DIAGNOSIS — M9908 Segmental and somatic dysfunction of rib cage: Secondary | ICD-10-CM | POA: Diagnosis not present

## 2021-06-22 DIAGNOSIS — M48061 Spinal stenosis, lumbar region without neurogenic claudication: Secondary | ICD-10-CM

## 2021-06-22 DIAGNOSIS — M9901 Segmental and somatic dysfunction of cervical region: Secondary | ICD-10-CM | POA: Diagnosis not present

## 2021-06-22 DIAGNOSIS — M9902 Segmental and somatic dysfunction of thoracic region: Secondary | ICD-10-CM | POA: Diagnosis not present

## 2021-06-22 DIAGNOSIS — M9903 Segmental and somatic dysfunction of lumbar region: Secondary | ICD-10-CM

## 2021-06-22 NOTE — Patient Instructions (Signed)
See me again in 6 weeks 

## 2021-06-22 NOTE — Assessment & Plan Note (Addendum)
Mild loss of lordosis  ?Discussed home exercises.  ?meloxicam as needed ?Doing well with the probiotic  ?

## 2021-06-23 ENCOUNTER — Other Ambulatory Visit: Payer: Self-pay | Admitting: Family Medicine

## 2021-07-20 ENCOUNTER — Other Ambulatory Visit: Payer: Self-pay

## 2021-07-20 ENCOUNTER — Encounter: Payer: Self-pay | Admitting: Family Medicine

## 2021-07-20 MED ORDER — VENLAFAXINE HCL ER 75 MG PO CP24: ORAL_CAPSULE | ORAL | 2 refills | Status: DC

## 2021-07-20 MED ORDER — MELOXICAM 15 MG PO TABS: ORAL_TABLET | ORAL | 0 refills | Status: DC

## 2021-07-20 MED ORDER — LEVOTHYROXINE SODIUM 50 MCG PO TABS: ORAL_TABLET | ORAL | 0 refills | Status: DC

## 2021-08-02 NOTE — Progress Notes (Unsigned)
Tamara [name] [address] [name] Phone: [phone] Subjective:   Tamara [name], am serving as a scribe for Dr. Hulan Saas.  I'm seeing this patient by the request  of:  [name], MD  CC: back and neck pain   [id number]  Tamara [name] is a 61 y.o. female coming in with complaint of back and neck pain. OMT on 06/22/2021. Patient states left shoudler has been bothersome. Mid back and lower back has been hurting on the right side. No new complaints.  Medications patient has been prescribed: Mobic, Effexor, hydroxyzine, synthroid  Taking:       Reviewed prior external information including notes and imaging from previsou exam, outside providers and external EMR if available.   As well as notes that were available from care everywhere and other healthcare systems.  Past medical history, social, surgical and family history all reviewed in electronic medical record.  No pertanent information unless stated regarding to the chief complaint.   Past Medical History:  Diagnosis Date   Allergic rhinitis, mild    Anemia    Depression    Hypertension    Ovarian cyst     Allergies  Allergen Reactions   Gabapentin Swelling   Quinolones Other (See Comments)   Ciprofloxacin Other (See Comments)    Hx of asc. Aortic dilitation.   Family hx of Marfans     Review of Systems:  No headache, visual changes, nausea, vomiting, diarrhea, constipation, dizziness, abdominal pain, skin rash, fevers, chills, night sweats, weight loss, swollen lymph nodes, body aches, joint swelling, chest pain, shortness of breath, mood changes. POSITIVE muscle aches  Objective  Blood pressure 118/86, pulse 96, height '5\' 1"'$  (1.549 m), weight 142 lb (64.4 kg), last menstrual period 06/28/2016, SpO2 98 %.   General: No apparent distress alert and oriented x3 mood and affect normal, dressed appropriately.  HEENT: Pupils equal, extraocular movements  intact  Respiratory: Patient's speak in full sentences and does not appear short of breath  Cardiovascular: No lower extremity edema, non tender, no erythema  Gait MSK:  Back patient does have significant tightness noted in the paraspinal musculature of the lumbar spine.  Patient does have some limited sidebending bilaterally.  Osteopathic findings  C2 flexed rotated and side bent right C5 flexed rotated and side bent left T3 extended rotated and side bent right inhaled rib T8 extended rotated and side bent left L2 flexed rotated and side bent right Sacrum right on right    Assessment and Plan:  Stenosis of lateral recess of lumbar spine Patient has been doing relatively well overall.  Still responding well to osteopathic manipulation.  Discussed which activities to do and which ones to avoid.  Discussed posture and ergonomics, which activities to avoid.  Follow-up again in 6 to 8 weeks otherwise. We discussed the meloxicam the patient is taking almost daily as well.  Nonallopathic problems  Decision today to treat with OMT was based on Physical Exam  After verbal consent patient was treated with HVLA, ME, FPR techniques in cervical, rib, thoracic, lumbar, and sacral  areas  Patient tolerated the procedure well with improvement in symptoms  Patient given exercises, stretches and lifestyle modifications  See medications in patient instructions if given  Patient will follow up in 4-8 weeks     The above documentation has been reviewed and is accurate and complete [name], DO         Note: This  dictation was prepared with Dragon dictation along with smaller phrase technology. Any transcriptional errors that result from this process are unintentional.

## 2021-08-03 ENCOUNTER — Encounter: Payer: Self-pay | Admitting: Family Medicine

## 2021-08-03 ENCOUNTER — Ambulatory Visit: Payer: 59 | Admitting: Family Medicine

## 2021-08-03 VITALS — BP 118/86 | HR 96 | Ht 61.0 in | Wt 142.0 lb

## 2021-08-03 DIAGNOSIS — M9903 Segmental and somatic dysfunction of lumbar region: Secondary | ICD-10-CM

## 2021-08-03 DIAGNOSIS — M48061 Spinal stenosis, lumbar region without neurogenic claudication: Secondary | ICD-10-CM

## 2021-08-03 DIAGNOSIS — M9908 Segmental and somatic dysfunction of rib cage: Secondary | ICD-10-CM | POA: Diagnosis not present

## 2021-08-03 DIAGNOSIS — M9902 Segmental and somatic dysfunction of thoracic region: Secondary | ICD-10-CM

## 2021-08-03 DIAGNOSIS — M9901 Segmental and somatic dysfunction of cervical region: Secondary | ICD-10-CM

## 2021-08-03 DIAGNOSIS — M9904 Segmental and somatic dysfunction of sacral region: Secondary | ICD-10-CM

## 2021-08-03 MED ORDER — TIZANIDINE HCL 2 MG PO TABS: 2.0000 mg | ORAL_TABLET | Freq: Every day | ORAL | 0 refills | Status: DC

## 2021-08-03 NOTE — Assessment & Plan Note (Signed)
Patient has been doing relatively well overall.  Still responding well to osteopathic manipulation.  Discussed which activities to do and which ones to avoid.  Discussed posture and ergonomics, which activities to avoid.  Follow-up again in 6 to 8 weeks otherwise.

## 2021-08-03 NOTE — Patient Instructions (Signed)
Zanaflex '2mg'$  next 3 nights See you again in 6-8 weeks

## 2021-08-16 ENCOUNTER — Encounter: Payer: Self-pay | Admitting: Family Medicine

## 2021-08-20 MED ORDER — LEVOTHYROXINE SODIUM 50 MCG PO TABS: ORAL_TABLET | ORAL | 0 refills | Status: DC

## 2021-08-20 MED ORDER — MELOXICAM 15 MG PO TABS
ORAL_TABLET | ORAL | 0 refills | Status: DC
Start: 2021-08-20 — End: 2021-09-15

## 2021-08-20 MED ORDER — LOSARTAN POTASSIUM 100 MG PO TABS: ORAL_TABLET | ORAL | 0 refills | Status: DC

## 2021-09-06 ENCOUNTER — Encounter: Payer: Self-pay | Admitting: Internal Medicine

## 2021-09-06 MED ORDER — ALPRAZOLAM 0.5 MG PO TABS
ORAL_TABLET | ORAL | 2 refills | Status: DC
Start: 2021-09-06 — End: 2022-02-22

## 2021-09-15 ENCOUNTER — Other Ambulatory Visit: Payer: Self-pay

## 2021-09-15 ENCOUNTER — Encounter: Payer: Self-pay | Admitting: Family Medicine

## 2021-09-15 MED ORDER — LEVOTHYROXINE SODIUM 50 MCG PO TABS: ORAL_TABLET | ORAL | 0 refills | Status: DC

## 2021-09-15 MED ORDER — MELOXICAM 15 MG PO TABS: ORAL_TABLET | ORAL | 0 refills | Status: DC

## 2021-09-15 MED ORDER — LOSARTAN POTASSIUM 100 MG PO TABS
ORAL_TABLET | ORAL | 0 refills | Status: DC
Start: 2021-09-15 — End: 2021-12-15

## 2021-09-16 ENCOUNTER — Encounter: Payer: Self-pay | Admitting: Cardiovascular Disease

## 2021-09-17 ENCOUNTER — Encounter: Payer: Self-pay | Admitting: Family Medicine

## 2021-09-21 NOTE — Progress Notes (Unsigned)
Tamara Tamara [name] Phone: [phone] Subjective:   Tamara Tamara [name], am serving as a scribe for Dr. Hulan Saas.  I'm seeing this patient by the request  of:  Tamara Borg, MD  CC: back and neck pain   [id number]  Tamara Tamara [name] is a 61 y.o. female coming in with complaint of back and neck pain. OMT on 08/03/2021. Patient states that her lower back has been bothering her on both sides and a spasm on R side of cervical spine due to sleeping wrong. Denies any radiating symptoms in the lower or upper extremities.   Patient has questions about genetic testing. Genetics recommend [facility] testing as a cost saving alternative to their testing.     Medications patient has been prescribed: Synthroid losartan meloxicam zanaflex effexor hydroxyzine  Taking:       Reviewed prior external information including notes and imaging from previsou exam, outside providers and external EMR if available.   As well as notes that were available from care everywhere and other healthcare systems.  Past medical history, social, surgical and family history all reviewed in electronic medical record.  Tamara [name] pertanent information unless stated regarding to the chief complaint.   Past Medical History:  Diagnosis Date   Allergic rhinitis, mild    Anemia    Depression    Hypertension    Ovarian cyst     Allergies  Allergen Reactions   Gabapentin Swelling   Quinolones Other (See Comments)   Ciprofloxacin Other (See Comments)    Hx of asc. Aortic dilitation.   Family hx of Marfans     Review of Systems:  Tamara [name] headache, visual changes, nausea, vomiting, diarrhea, constipation, dizziness, abdominal pain, skin rash, fevers, chills, night sweats, weight loss, swollen lymph nodes, body aches, joint swelling, chest pain, shortness of breath, mood changes. POSITIVE muscle aches  Objective  Blood pressure 102/74, pulse 86, height '5\' 1"'$   (1.549 m), weight 140 lb (63.5 kg), last menstrual period 06/28/2016, SpO2 97 %.   General: Tamara [name] apparent distress alert and oriented x3 mood and affect normal, dressed appropriately.  HEENT: Pupils equal, extraocular movements intact  Respiratory: Patient's speak in full sentences and does not appear short of breath  Cardiovascular: Tamara [name] lower extremity edema, non tender, Tamara [name] erythema  Gait MSK:  Back continues to have instability of the paraspinal musculature of the lumbar spine.  Patient does have some limited range of motion otherwise.  Osteopathic findings  C2 flexed rotated and side bent right C7 flexed rotated and side bent left T3 extended rotated and side bent right inhaled rib T9 extended rotated and side bent left L2 flexed rotated and side bent right Sacrum right on right     Assessment and Plan:  Osteoarthritis of spine with radiculopathy, cervical region Tamara [name] known arthritic changes. Chronic, with mild exacerbation.  Refilled medications and has Zanaflex for breakthrough.  Spent 41 minutes talking to patient about the genetic testing.  Does have a family history of aortic aneurysm.  Patient does have some signs and symptoms consistent with may be a connective tissue disorder.  We discussed that we could consider the possibility of genetic testing.  Patient will get the information and if he needs an order from [place] I do think it is worthwhile.  Family history of aortic aneurysm Strong family history and is following up with cardiology.    Nonallopathic problems  Decision today to treat with OMT was based on  Physical Exam  After verbal consent patient was treated with HVLA, ME, FPR techniques in cervical, rib, thoracic, lumbar, and sacral  areas  Patient tolerated the procedure well with improvement in symptoms  Patient given exercises, stretches and lifestyle modifications  See medications in patient instructions if given  Patient will follow up in 4-8 weeks    The  above documentation has been reviewed and is accurate and complete Tamara Pulley, DO          Note: This dictation was prepared with Dragon dictation along with smaller phrase technology. Any transcriptional errors that result from this process are unintentional.

## 2021-09-22 ENCOUNTER — Ambulatory Visit (INDEPENDENT_AMBULATORY_CARE_PROVIDER_SITE_OTHER): Payer: 59 | Admitting: Family Medicine

## 2021-09-22 VITALS — BP 102/74 | HR 86 | Ht 61.0 in | Wt 140.0 lb

## 2021-09-22 DIAGNOSIS — M9908 Segmental and somatic dysfunction of rib cage: Secondary | ICD-10-CM

## 2021-09-22 DIAGNOSIS — M9902 Segmental and somatic dysfunction of thoracic region: Secondary | ICD-10-CM

## 2021-09-22 DIAGNOSIS — M9901 Segmental and somatic dysfunction of cervical region: Secondary | ICD-10-CM

## 2021-09-22 DIAGNOSIS — M4722 Other spondylosis with radiculopathy, cervical region: Secondary | ICD-10-CM | POA: Diagnosis not present

## 2021-09-22 DIAGNOSIS — Z8249 Family history of ischemic heart disease and other diseases of the circulatory system: Secondary | ICD-10-CM | POA: Diagnosis not present

## 2021-09-22 DIAGNOSIS — M9904 Segmental and somatic dysfunction of sacral region: Secondary | ICD-10-CM

## 2021-09-22 NOTE — Assessment & Plan Note (Signed)
No known arthritic changes. Chronic, with mild exacerbation.  Refilled medications and has Zanaflex for breakthrough.  Spent 41 minutes talking to patient about the genetic testing.  Does have a family history of aortic aneurysm.  Patient does have some signs and symptoms consistent with may be a connective tissue disorder.  We discussed that we could consider the possibility of genetic testing.  Patient will get the information and if he needs an order from [place] I do think it is worthwhile.

## 2021-09-22 NOTE — Assessment & Plan Note (Signed)
Strong family history and is following up with cardiology.

## 2021-09-22 NOTE — Patient Instructions (Addendum)
Genetic Health Screen See me again in 6-8 weeks

## 2021-10-19 ENCOUNTER — Other Ambulatory Visit: Payer: Self-pay

## 2021-10-19 ENCOUNTER — Encounter: Payer: Self-pay | Admitting: Family Medicine

## 2021-10-19 MED ORDER — MELOXICAM 15 MG PO TABS: ORAL_TABLET | ORAL | 0 refills | Status: DC

## 2021-10-19 NOTE — Telephone Encounter (Signed)
Medication refilled

## 2021-11-02 ENCOUNTER — Encounter: Payer: Self-pay | Admitting: Family Medicine

## 2021-11-02 NOTE — Progress Notes (Deleted)
  [place] Phone: [phone] Subjective:    I'm seeing this patient by the request  of:  [name], MD  CC:   [id number]  Tamara [name] is a 61 y.o. female coming in with complaint of back and neck pain. OMT on 09/22/2021. Patient states   Medications patient has been prescribed: Losartan Synthroid Mobic  Taking:         Reviewed prior external information including notes and imaging from previsou exam, outside providers and external EMR if available.   As well as notes that were available from care everywhere and other healthcare systems.  Past medical history, social, surgical and family history all reviewed in electronic medical record.  No pertanent information unless stated regarding to the chief complaint.   Past Medical History:  Diagnosis Date   Allergic rhinitis, mild    Anemia    Depression    Hypertension    Ovarian cyst     Allergies  Allergen Reactions   Gabapentin Swelling   Quinolones Other (See Comments)   Ciprofloxacin Other (See Comments)    Hx of asc. Aortic dilitation.   Family hx of Marfans     Review of Systems:  No headache, visual changes, nausea, vomiting, diarrhea, constipation, dizziness, abdominal pain, skin rash, fevers, chills, night sweats, weight loss, swollen lymph nodes, body aches, joint swelling, chest pain, shortness of breath, mood changes. POSITIVE muscle aches  Objective  Last menstrual period 06/28/2016.   General: No apparent distress alert and oriented x3 mood and affect normal, dressed appropriately.  HEENT: Pupils equal, extraocular movements intact  Respiratory: Patient's speak in full sentences and does not appear short of breath  Cardiovascular: No lower extremity edema, non tender, no erythema  Gait MSK:  Back   Osteopathic findings  C2 flexed rotated and side bent right C6 flexed rotated and side bent left T3 extended  rotated and side bent right inhaled rib T9 extended rotated and side bent left L2 flexed rotated and side bent right Sacrum right on right       Assessment and Plan:  No problem-specific Assessment & Plan notes found for this encounter.    Nonallopathic problems  Decision today to treat with OMT was based on Physical Exam  After verbal consent patient was treated with HVLA, ME, FPR techniques in cervical, rib, thoracic, lumbar, and sacral  areas  Patient tolerated the procedure well with improvement in symptoms  Patient given exercises, stretches and lifestyle modifications  See medications in patient instructions if given  Patient will follow up in 4-8 weeks             Note: This dictation was prepared with Dragon dictation along with smaller phrase technology. Any transcriptional errors that result from this process are unintentional.

## 2021-11-03 ENCOUNTER — Ambulatory Visit: Payer: 59 | Admitting: Family Medicine

## 2021-11-04 ENCOUNTER — Encounter: Payer: Self-pay | Admitting: Family Medicine

## 2021-11-05 ENCOUNTER — Encounter: Payer: Self-pay | Admitting: Internal Medicine

## 2021-11-05 MED ORDER — NIRMATRELVIR/RITONAVIR (PAXLOVID)TABLET: 3.0000 | ORAL_TABLET | Freq: Two times a day (BID) | ORAL | 0 refills | Status: AC

## 2021-11-08 ENCOUNTER — Telehealth: Payer: 59 | Admitting: Family Medicine

## 2021-11-29 ENCOUNTER — Other Ambulatory Visit: Payer: Self-pay

## 2021-11-29 MED ORDER — INFLUENZA VAC SPLIT QUAD 0.5 ML IM SUSY
0.5000 mL | PREFILLED_SYRINGE | Freq: Once | INTRAMUSCULAR | 0 refills | Status: AC
  Filled 2021-11-29: qty 0.5, 1d supply, fill #0

## 2021-12-07 ENCOUNTER — Encounter: Payer: Self-pay | Admitting: Family Medicine

## 2021-12-13 ENCOUNTER — Other Ambulatory Visit: Payer: Self-pay | Admitting: Family Medicine

## 2021-12-13 NOTE — Telephone Encounter (Signed)
Please refill as per office routine med refill policy (all routine meds to be refilled for 3 mo or monthly (per pt preference) up to one year from last visit, then month to month grace period for 3 mo, then further med refills will have to be denied) ? ?

## 2021-12-21 ENCOUNTER — Encounter: Payer: Self-pay | Admitting: Family Medicine

## 2021-12-21 MED ORDER — HYDROXYZINE HCL 10 MG PO TABS: 10.0000 mg | ORAL_TABLET | Freq: Three times a day (TID) | ORAL | 0 refills | Status: DC | PRN

## 2021-12-23 ENCOUNTER — Other Ambulatory Visit (INDEPENDENT_AMBULATORY_CARE_PROVIDER_SITE_OTHER): Payer: 59

## 2021-12-23 DIAGNOSIS — I10 Essential (primary) hypertension: Secondary | ICD-10-CM

## 2021-12-23 DIAGNOSIS — E559 Vitamin D deficiency, unspecified: Secondary | ICD-10-CM

## 2021-12-23 DIAGNOSIS — R739 Hyperglycemia, unspecified: Secondary | ICD-10-CM | POA: Diagnosis not present

## 2021-12-23 DIAGNOSIS — E538 Deficiency of other specified B group vitamins: Secondary | ICD-10-CM | POA: Diagnosis not present

## 2021-12-23 LAB — HEPATIC FUNCTION PANEL
ALT: 25 U/L (ref 0–35)
AST: 28 U/L (ref 0–37)
Albumin: 4.9 g/dL (ref 3.5–5.2)
Alkaline Phosphatase: 68 U/L (ref 39–117)
Bilirubin, Direct: 0.1 mg/dL (ref 0.0–0.3)
Total Bilirubin: 0.5 mg/dL (ref 0.2–1.2)
Total Protein: 7.3 g/dL (ref 6.0–8.3)

## 2021-12-23 LAB — CBC WITH DIFFERENTIAL/PLATELET
Basophils Absolute: 0 10*3/uL (ref 0.0–0.1)
Basophils Relative: 0.5 % (ref 0.0–3.0)
Eosinophils Absolute: 0.6 10*3/uL (ref 0.0–0.7)
Eosinophils Relative: 8.1 % — ABNORMAL HIGH (ref 0.0–5.0)
HCT: 41.3 % (ref 36.0–46.0)
Hemoglobin: 13.9 g/dL (ref 12.0–15.0)
Lymphocytes Relative: 34.3 % (ref 12.0–46.0)
Lymphs Abs: 2.5 10*3/uL (ref 0.7–4.0)
MCHC: 33.5 g/dL (ref 30.0–36.0)
MCV: 98.4 fl (ref 78.0–100.0)
Monocytes Absolute: 0.7 10*3/uL (ref 0.1–1.0)
Monocytes Relative: 9.9 % (ref 3.0–12.0)
Neutro Abs: 3.4 10*3/uL (ref 1.4–7.7)
Neutrophils Relative %: 47.2 % (ref 43.0–77.0)
Platelets: 350 10*3/uL (ref 150.0–400.0)
RBC: 4.2 Mil/uL (ref 3.87–5.11)
RDW: 12.3 % (ref 11.5–15.5)
WBC: 7.2 10*3/uL (ref 4.0–10.5)

## 2021-12-23 LAB — LIPID PANEL
Cholesterol: 214 mg/dL — ABNORMAL HIGH (ref 0–200)
HDL: 119.2 mg/dL (ref >39.00)
LDL Cholesterol: 85 mg/dL (ref 0–99)
NonHDL: 94.76
Total CHOL/HDL Ratio: 2
Triglycerides: 49 mg/dL (ref 0.0–149.0)
VLDL: 9.8 mg/dL (ref 0.0–40.0)

## 2021-12-23 LAB — URINALYSIS, ROUTINE W REFLEX MICROSCOPIC
Bilirubin Urine: NEGATIVE
Hgb urine dipstick: NEGATIVE
Ketones, ur: NEGATIVE
Nitrite: NEGATIVE
Specific Gravity, Urine: 1.02 (ref 1.000–1.030)
Total Protein, Urine: NEGATIVE
Urine Glucose: NEGATIVE
Urobilinogen, UA: 0.2 (ref 0.0–1.0)
pH: 6 (ref 5.0–8.0)

## 2021-12-23 LAB — BASIC METABOLIC PANEL
BUN: 17 mg/dL (ref 6–23)
CO2: 33 mEq/L — ABNORMAL HIGH (ref 19–32)
Calcium: 9.6 mg/dL (ref 8.4–10.5)
Chloride: 98 mEq/L (ref 96–112)
Creatinine, Ser: 0.84 mg/dL (ref 0.40–1.20)
GFR: 76.29 mL/min (ref >60.00)
Glucose, Bld: 93 mg/dL (ref 70–99)
Potassium: 4.9 mEq/L (ref 3.5–5.1)
Sodium: 137 mEq/L (ref 135–145)

## 2021-12-23 LAB — VITAMIN B12: Vitamin B-12: >1500 pg/mL — ABNORMAL HIGH (ref 211–911)

## 2021-12-23 LAB — TSH: TSH: 1 u[IU]/mL (ref 0.35–5.50)

## 2021-12-23 LAB — VITAMIN D 25 HYDROXY (VIT D DEFICIENCY, FRACTURES): VITD: 81.43 ng/mL (ref 30.00–100.00)

## 2021-12-23 LAB — HEMOGLOBIN A1C: Hgb A1c MFr Bld: 5.1 % (ref 4.6–6.5)

## 2021-12-30 ENCOUNTER — Other Ambulatory Visit: Payer: Self-pay

## 2021-12-31 NOTE — Progress Notes (Unsigned)
[place] Phone: [phone] Subjective:   Tamara [name], am serving as a scribe for Dr. Hulan Saas. I'm seeing this patient by the request  of:  [name], MD  CC: Back and neck pain follow-up  [id number]  Tamara [name] is a 61 y.o. female coming in with complaint of back and neck pain. OMT 09/22/2021. Patient states that she has been doing well since last visit. Starting to feel achiness in her back.   Patient states that she has been having numbness in great toe, R foot and then the other toes started going numb. Also notes a catching in both ankles. Symptoms have been ongoing for a couple months.   Seeing Dr. Jenny Reichmann tomorrow but wants to discuss labs.  Recent labs showed that patient's B12 was doing well with greater than 1500, vitamin D is also elevated at 87.  Medications patient has been prescribed: Hydroxizine  Taking:         Reviewed prior external information including notes and imaging from previsou exam, outside providers and external EMR if available.   As well as notes that were available from care everywhere and other healthcare systems.  Past medical history, social, surgical and family history all reviewed in electronic medical record.  [name] pertanent information unless stated regarding to the chief complaint.   Past Medical History:  Diagnosis Date   Allergic rhinitis, mild    Anemia    Depression    Hypertension    Ovarian cyst     Allergies  Allergen Reactions   Gabapentin Swelling   Quinolones Other (See Comments)   Ciprofloxacin Other (See Comments)    Hx of asc. Aortic dilitation.   Family hx of Marfans     Review of Systems:  [name] headache, visual changes, nausea, vomiting, diarrhea, constipation, dizziness, abdominal pain, skin rash, fevers, chills, night sweats, weight loss, swollen lymph nodes, , joint swelling, chest pain, shortness of breath, mood  changes. POSITIVE muscle aches, body aches  Objective  Blood pressure (!) 120/92, pulse 87, height '5\' 1"'$  (1.549 m), weight 142 lb (64.4 kg), last menstrual period 06/28/2016, SpO2 99 %.   General: [name] apparent distress alert and oriented x3 mood and affect normal, dressed appropriately.  HEENT: Pupils equal, extraocular movements intact  Respiratory: Patient's speak in full sentences and does not appear short of breath  Cardiovascular: [name] lower extremity edema, non tender, [name] erythema  Low back exam does have some loss lordosis.  Some tenderness to palpation in the paraspinal musculature.  Patient does have some loss of extension of the back.  [name] true radiculopathy but tightness of the straight leg test.  Osteopathic findings  C2 flexed rotated and side bent right C6 flexed rotated and side bent left T3 extended rotated and side bent right inhaled rib T8 extended rotated and side bent left L2 flexed rotated and side bent right Sacrum right on right       Assessment and Plan:  Stenosis of lateral recess of lumbar spine Known stenosis overall.  Discussed with patient about icing regimen and home exercises.  Still avoiding any type of surgical intervention and I think with patient and her recent weight loss should do relatively well.  Discussed home exercises and icing regimen.  Increase activity slowly otherwise.  Follow-up again in 6 to 8 weeks medications include meloxicam 15 mg daily as needed.    Nonallopathic problems  Decision today to treat  with OMT was based on Physical Exam  After verbal consent patient was treated with HVLA, ME, FPR techniques in cervical, rib, thoracic, lumbar, and sacral  areas  Patient tolerated the procedure well with improvement in symptoms  Patient given exercises, stretches and lifestyle modifications  See medications in patient instructions if given  Patient will follow up in 4-8 weeks     The above documentation has been reviewed and is  accurate and complete Tamara Pulley, DO         Note: This dictation was prepared with Dragon dictation along with smaller phrase technology. Any transcriptional errors that result from this process are unintentional.

## 2022-01-04 ENCOUNTER — Encounter: Payer: Self-pay | Admitting: Family Medicine

## 2022-01-04 ENCOUNTER — Ambulatory Visit: Payer: 59 | Admitting: Family Medicine

## 2022-01-04 VITALS — BP 120/92 | HR 87 | Ht 61.0 in | Wt 142.0 lb

## 2022-01-04 DIAGNOSIS — M9908 Segmental and somatic dysfunction of rib cage: Secondary | ICD-10-CM

## 2022-01-04 DIAGNOSIS — M9901 Segmental and somatic dysfunction of cervical region: Secondary | ICD-10-CM | POA: Diagnosis not present

## 2022-01-04 DIAGNOSIS — M9904 Segmental and somatic dysfunction of sacral region: Secondary | ICD-10-CM

## 2022-01-04 DIAGNOSIS — M48061 Spinal stenosis, lumbar region without neurogenic claudication: Secondary | ICD-10-CM | POA: Diagnosis not present

## 2022-01-04 DIAGNOSIS — M9903 Segmental and somatic dysfunction of lumbar region: Secondary | ICD-10-CM | POA: Diagnosis not present

## 2022-01-04 DIAGNOSIS — M9902 Segmental and somatic dysfunction of thoracic region: Secondary | ICD-10-CM

## 2022-01-04 NOTE — Assessment & Plan Note (Signed)
Known stenosis overall.  Discussed with patient about icing regimen and home exercises.  Still avoiding any type of surgical intervention and I think with patient and her recent weight loss should do relatively well.  Discussed home exercises and icing regimen.  Increase activity slowly otherwise.  Follow-up again in 6 to 8 weeks medications include meloxicam 15 mg daily as needed.

## 2022-01-04 NOTE — Patient Instructions (Signed)
Great to see you Thank you for the treats Remeron for your dad See me in 6-7 weeks

## 2022-01-05 ENCOUNTER — Encounter: Payer: 59 | Admitting: Internal Medicine

## 2022-01-11 ENCOUNTER — Other Ambulatory Visit: Payer: Self-pay

## 2022-01-11 MED ORDER — COVID-19 MRNA 2023-2024 VACCINE (COMIRNATY) 0.3 ML INJECTION
0.3000 mL | Freq: Once | INTRAMUSCULAR | 0 refills | Status: AC
  Filled 2022-01-11: qty 0.3, 1d supply, fill #0

## 2022-01-19 ENCOUNTER — Encounter: Payer: 59 | Admitting: Internal Medicine

## 2022-01-20 ENCOUNTER — Encounter: Payer: Self-pay | Admitting: Family Medicine

## 2022-01-20 ENCOUNTER — Other Ambulatory Visit: Payer: Self-pay | Admitting: Family Medicine

## 2022-01-24 ENCOUNTER — Other Ambulatory Visit: Payer: Self-pay | Admitting: Family Medicine

## 2022-01-24 ENCOUNTER — Encounter: Payer: Self-pay | Admitting: Family Medicine

## 2022-01-24 MED ORDER — MELOXICAM 15 MG PO TABS: ORAL_TABLET | ORAL | 0 refills | Status: DC

## 2022-01-24 NOTE — Telephone Encounter (Signed)
Pt was called and notified of the meloxicam refill

## 2022-01-25 ENCOUNTER — Encounter: Payer: Self-pay | Admitting: Internal Medicine

## 2022-01-27 ENCOUNTER — Encounter: Payer: 59 | Admitting: Internal Medicine

## 2022-02-16 NOTE — Progress Notes (Deleted)
  [place] Phone: [phone] Subjective:    I'm seeing this patient by the request  of:  [name], MD  CC: back and neck pain follow up   [id number]  Tamara [name] is a 62 y.o. female coming in with complaint of back and neck pain. OMT 01/04/2022. Patient states   Medications patient has been prescribed: Hydroxizine, Zanaflex  Taking:         Reviewed prior external information including notes and imaging from previsou exam, outside providers and external EMR if available.   As well as notes that were available from care everywhere and other healthcare systems.  Past medical history, social, surgical and family history all reviewed in electronic medical record.  No pertanent information unless stated regarding to the chief complaint.   Past Medical History:  Diagnosis Date   Allergic rhinitis, mild    Anemia    Depression    Hypertension    Ovarian cyst     Allergies  Allergen Reactions   Gabapentin Swelling   Quinolones Other (See Comments)   Ciprofloxacin Other (See Comments)    Hx of asc. Aortic dilitation.   Family hx of Marfans     Review of Systems:  No headache, visual changes, nausea, vomiting, diarrhea, constipation, dizziness, abdominal pain, skin rash, fevers, chills, night sweats, weight loss, swollen lymph nodes, body aches, joint swelling, chest pain, shortness of breath, mood changes. POSITIVE muscle aches  Objective  Last menstrual period 06/28/2016.   General: No apparent distress alert and oriented x3 mood and affect normal, dressed appropriately.  HEENT: Pupils equal, extraocular movements intact  Respiratory: Patient's speak in full sentences and does not appear short of breath  Cardiovascular: No lower extremity edema, non tender, no erythema  Gait MSK:  Back   Osteopathic findings  C2 flexed rotated and side bent right C6 flexed rotated and side  bent left T3 extended rotated and side bent right inhaled rib T9 extended rotated and side bent left L2 flexed rotated and side bent right Sacrum right on right       Assessment and Plan:  No problem-specific Assessment & Plan notes found for this encounter.    Nonallopathic problems  Decision today to treat with OMT was based on Physical Exam  After verbal consent patient was treated with HVLA, ME, FPR techniques in cervical, rib, thoracic, lumbar, and sacral  areas  Patient tolerated the procedure well with improvement in symptoms  Patient given exercises, stretches and lifestyle modifications  See medications in patient instructions if given  Patient will follow up in 4-8 weeks     The above documentation has been reviewed and is accurate and complete Tamara Pulley, DO         Note: This dictation was prepared with Dragon dictation along with smaller phrase technology. Any transcriptional errors that result from this process are unintentional.

## 2022-02-22 ENCOUNTER — Ambulatory Visit: Payer: 59 | Admitting: Family Medicine

## 2022-02-22 ENCOUNTER — Ambulatory Visit (INDEPENDENT_AMBULATORY_CARE_PROVIDER_SITE_OTHER): Payer: 59 | Admitting: Internal Medicine

## 2022-02-22 ENCOUNTER — Encounter: Payer: Self-pay | Admitting: Internal Medicine

## 2022-02-22 VITALS — BP 122/70 | HR 83 | Temp 98.4°F | Ht 61.0 in | Wt 139.0 lb

## 2022-02-22 DIAGNOSIS — E538 Deficiency of other specified B group vitamins: Secondary | ICD-10-CM

## 2022-02-22 DIAGNOSIS — Z0001 Encounter for general adult medical examination with abnormal findings: Secondary | ICD-10-CM

## 2022-02-22 DIAGNOSIS — I10 Essential (primary) hypertension: Secondary | ICD-10-CM

## 2022-02-22 DIAGNOSIS — I77819 Aortic ectasia, unspecified site: Secondary | ICD-10-CM

## 2022-02-22 DIAGNOSIS — R739 Hyperglycemia, unspecified: Secondary | ICD-10-CM

## 2022-02-22 DIAGNOSIS — E559 Vitamin D deficiency, unspecified: Secondary | ICD-10-CM

## 2022-02-22 DIAGNOSIS — F418 Other specified anxiety disorders: Secondary | ICD-10-CM

## 2022-02-22 DIAGNOSIS — Z01419 Encounter for gynecological examination (general) (routine) without abnormal findings: Secondary | ICD-10-CM

## 2022-02-22 MED ORDER — ALPRAZOLAM 0.5 MG PO TABS: ORAL_TABLET | ORAL | 2 refills | Status: DC

## 2022-02-22 MED ORDER — LEVOTHYROXINE SODIUM 50 MCG PO TABS: 50.0000 ug | ORAL_TABLET | Freq: Every day | ORAL | 3 refills | Status: DC

## 2022-02-22 MED ORDER — LOSARTAN POTASSIUM 100 MG PO TABS: ORAL_TABLET | ORAL | 3 refills | Status: DC

## 2022-02-22 NOTE — Assessment & Plan Note (Signed)
Stable, decliens need for any change in tx

## 2022-02-22 NOTE — Patient Instructions (Addendum)
Please continue all other medications as before, and refills have been done  Please have the pharmacy call with any other refills you may need.  Please continue your efforts at being more active, low cholesterol diet, and weight control.  You are otherwise up to date with prevention measures today.  Please keep your appointments with your specialists as you may have planned  You will be contacted regarding the referral for: GYN  Please make an Appointment to return for your 1 year visit, or sooner if needed, with Lab testing by Appointment as well, to be done about 3-5 days before at the [facility] (so this is for TWO appointments - please see the scheduling desk as you leave)

## 2022-02-22 NOTE — Assessment & Plan Note (Signed)
With normal size on last CTA 2021 - pt plan to cont f/u Dr Acie Fredrickson every 3 yrs

## 2022-02-22 NOTE — Progress Notes (Signed)
Patient ID: Tamara [name], female   DOB: 21-Oct-1960, 63 y.o.   MRN: [id number]         Chief Complaint:: wellness exam and Physical (No concerns or questions)        HPI:  Tamara [name] is a 62 y.o. female here for wellness exam; o/w up to date but due for GYN referral as last pap just less than 3 yrs.                          Also s/p covid infection sept 2023, up to date with boosters.  Pt denies chest pain, increased sob or doe, wheezing, orthopnea, PND, increased LE swelling, palpitations, dizziness or syncope.   Pt denies polydipsia, polyuria, or new focal neuro s/s.    Pt denies fever, wt loss, night sweats, loss of appetite, or other constitutional symptoms  Denies worsening depressive symptoms, suicidal ideation, or panic   Wt Readings from Last 3 Encounters:  02/22/22 139 lb (63 kg)  01/04/22 142 lb (64.4 kg)  09/22/21 140 lb (63.5 kg)   BP Readings from Last 3 Encounters:  02/22/22 122/70  01/04/22 (!) 120/92  09/22/21 102/74   Immunization History  Administered Date(s) Administered   COVID-19, mRNA, vaccine(Comirnaty)12 years and older 01/11/2022   Influenza Whole 11/11/2008   Influenza,inj,Quad PF,6+ Mos 11/14/2014, 12/12/2016, 11/10/2017, 11/08/2018, 12/17/2019, 12/30/2021   Influenza-Unspecified 11/23/2020   Moderna SARS-COV2 Booster Vaccination 05/20/2020   PFIZER(Purple Top)SARS-COV-2 Vaccination 04/17/2019, 05/18/2019, 10/22/2019, 10/26/2020   Td 09/14/2000, 11/11/2008   Tdap 11/12/2018   Zoster Recombinat (Shingrix) 12/30/2020, 04/07/2021  There are no preventive care reminders to display for this patient.    Past Medical History:  Diagnosis Date   Allergic rhinitis, mild    Anemia    Depression    Diabetes mellitus without complication ([place])    Hypertension    Ovarian cyst    Pancreatic cancer [facility])    Past Surgical History:  Procedure Laterality Date   LAMINECTOMY     LUMBAR EPIDURAL INJECTION  11/03/2014    reports that she has never  smoked. She has never used smokeless tobacco. She reports current alcohol use of about 3.0 standard drinks of alcohol per week. She reports that she does not use drugs. family history includes Alcohol abuse in her father; Depression in her father and mother; Pancreatic cancer in her father. Allergies  Allergen Reactions   Gabapentin Swelling   Quinolones Other (See Comments)   Ciprofloxacin Other (See Comments)    Hx of asc. Aortic dilitation.   Family hx of Marfans   Current Outpatient Medications on File Prior to Visit  Medication Sig Dispense Refill   cetirizine (ZYRTEC) 10 MG tablet Take 1 tablet by mouth daily.     Cholecalciferol (THERA-D 2000) 50 MCG (2000 UT) TABS 1 tab by mouth once daily 90 tablet 3   Cholecalciferol (VITAMIN D) 10 MCG/ML LIQD Vitamin D     fluticasone (FLONASE ALLERGY RELIEF) 50 MCG/ACT nasal spray Flonase     hydrOXYzine (ATARAX) 10 MG tablet TAKE 1 TABLET(10 MG) BY MOUTH THREE TIMES DAILY AS NEEDED 90 tablet 0   meloxicam (MOBIC) 15 MG tablet TAKE 1 TABLET(15 MG) BY MOUTH DAILY 90 tablet 0   Probiotic Product (ALIGN PO) Take 1 tablet by mouth daily.     Probiotic Product (ALIGN PO) Align     venlafaxine XR (EFFEXOR-XR) 75 MG 24 hr capsule TAKE 1 CAPSULE BY MOUTH DAILY WITH BREAKFAST  90 capsule 2   vitamin B-12 (CYANOCOBALAMIN) 100 MCG tablet Vitamin B12     No current facility-administered medications on file prior to visit.        ROS:  All others reviewed and negative.  Objective        PE:  BP 122/70 (BP Location: Right Arm, Patient Position: Sitting, Cuff Size: Large)   Pulse 83   Temp 98.4 F (36.9 C) (Oral)   Ht '5\' 1"'$  (1.549 m)   Wt 139 lb (63 kg)   LMP 06/28/2016 (Approximate)   SpO2 96%   BMI 26.26 kg/m                 Constitutional: Pt appears in NAD               HENT: Head: NCAT.                Right Ear: External ear normal.                 Left Ear: External ear normal.                Eyes: . Pupils are equal, round, and reactive  to light. Conjunctivae and EOM are normal               Nose: without d/c or deformity               Neck: Neck supple. Gross normal ROM               Cardiovascular: Normal rate and regular rhythm.                 Pulmonary/Chest: Effort normal and breath sounds without rales or wheezing.                Abd:  Soft, NT, ND, + BS, no organomegaly               Neurological: Pt is alert. At baseline orientation, motor grossly intact               Skin: Skin is warm. No rashes, no other new lesions, LE edema - none               Psychiatric: Pt behavior is normal without agitation   Micro: none  Cardiac tracings I have personally interpreted today:  none  Pertinent Radiological findings (summarize): none   Lab Results  Component Value Date   WBC 7.2 12/23/2021   HGB 13.9 12/23/2021   HCT 41.3 12/23/2021   PLT 350.0 12/23/2021   GLUCOSE 93 12/23/2021   CHOL 214 (H) 12/23/2021   TRIG 49.0 12/23/2021   HDL 119.20 12/23/2021   LDLDIRECT 89.0 01/13/2020   LDLCALC 85 12/23/2021   ALT 25 12/23/2021   AST 28 12/23/2021   NA 137 12/23/2021   K 4.9 12/23/2021   CL 98 12/23/2021   CREATININE 0.84 12/23/2021   BUN 17 12/23/2021   CO2 33 (H) 12/23/2021   TSH 1.00 12/23/2021   HGBA1C 5.1 12/23/2021   Assessment/Plan:  Tamara [name] is a 62 y.o. White or Caucasian [1] female with  has a past medical history of Allergic rhinitis, mild, Anemia, Depression, Diabetes mellitus without complication ([place]), Hypertension, Ovarian cyst, and Pancreatic cancer ([place]).  Vitamin D deficiency Last vitamin D Lab Results  Component Value Date   VD25OH 81.43 12/23/2021   Stable, cont oral replacement   B12 deficiency Lab Results  Component Value Date  VITAMINB12 >1500 (H) 12/23/2021   Stable, cont oral replacement - b12 1000 mcg qd   Aortic dilatation ([facility]) With normal size on last CTA 2021 - pt plan to cont f/u Dr Acie Fredrickson every 3 yrs  Encounter for well adult exam with abnormal  findings Age and sex appropriate education and counseling updated with regular exercise and diet Referrals for preventative services - none needed Immunizations addressed - none needed Smoking counseling  - none needed Evidence for depression or other mood disorder - none significant Most recent labs reviewed. I have personally reviewed and have noted: 1) the patient's medical and social history 2) The patient's current medications and supplements 3) The patient's height, weight, and BMI have been recorded in the chart   Depression with anxiety Stable, decliens need for any change in tx  Essential hypertension BP Readings from Last 3 Encounters:  02/22/22 122/70  01/04/22 (!) 120/92  09/22/21 102/74   Stable, pt to continue medical treatment losartan 100 mg qd  Followup: Return in about 1 year (around 02/23/2023).  Cathlean Cower, MD 02/22/2022 9:33 PM [facility]

## 2022-02-22 NOTE — Assessment & Plan Note (Signed)

## 2022-02-22 NOTE — Assessment & Plan Note (Signed)
Lab Results  Component Value Date   VITAMINB12 >1500 (H) 12/23/2021   Stable, cont oral replacement - b12 1000 mcg qd

## 2022-02-22 NOTE — Assessment & Plan Note (Signed)
BP Readings from Last 3 Encounters:  02/22/22 122/70  01/04/22 (!) 120/92  09/22/21 102/74   Stable, pt to continue medical treatment losartan 100 mg qd

## 2022-02-22 NOTE — Assessment & Plan Note (Signed)
Last vitamin D Lab Results  Component Value Date   VD25OH 81.43 12/23/2021   Stable, cont oral replacement

## 2022-02-25 ENCOUNTER — Ambulatory Visit: Payer: 59 | Admitting: Family Medicine

## 2022-02-25 VITALS — BP 102/78 | HR 100 | Ht 61.0 in | Wt 140.0 lb

## 2022-02-25 DIAGNOSIS — M5416 Radiculopathy, lumbar region: Secondary | ICD-10-CM

## 2022-02-25 DIAGNOSIS — M9908 Segmental and somatic dysfunction of rib cage: Secondary | ICD-10-CM

## 2022-02-25 DIAGNOSIS — M9901 Segmental and somatic dysfunction of cervical region: Secondary | ICD-10-CM

## 2022-02-25 DIAGNOSIS — M9903 Segmental and somatic dysfunction of lumbar region: Secondary | ICD-10-CM | POA: Diagnosis not present

## 2022-02-25 DIAGNOSIS — M9902 Segmental and somatic dysfunction of thoracic region: Secondary | ICD-10-CM

## 2022-02-25 DIAGNOSIS — M9904 Segmental and somatic dysfunction of sacral region: Secondary | ICD-10-CM

## 2022-02-25 NOTE — Assessment & Plan Note (Addendum)
Increasing changes noted today.  Does have some pain when seems to be going on the legs.  Encourage patient to be careful with lifting mechanics.  We discussed icing regimen and home exercises, which activities to do and which ones to avoid, increase activity slowly.  Follow-up with me again in 6 to 8 weeks.  Patient will be traveling because we will have to go to help her father's estate.  Total time not including procedures 31 minutes

## 2022-02-25 NOTE — Progress Notes (Signed)
[place] Phone: [phone] Subjective:   Tamara [name], am serving as a scribe for Dr. Hulan Saas.  I'm seeing this patient by the request  of:  [name], MD  CC: Back and neck pain follow-up  [id number]  Tamara [name] is a 62 y.o. female coming in with complaint of back and neck pain. OMT 01/04/2022. Patient states that she is doing ok. Stiff since she was last seen in November.   Medications patient has been prescribed: Meloxicam, Hydroxizine  Taking:         Reviewed prior external information including notes and imaging from previsou exam, outside providers and external EMR if available.   As well as notes that were available from care everywhere and other healthcare systems.  Past medical history, social, surgical and family history all reviewed in electronic medical record.  [name] pertanent information unless stated regarding to the chief complaint.   Past Medical History:  Diagnosis Date   Allergic rhinitis, mild    Anemia    Depression    Diabetes mellitus without complication (HCC)    Hypertension    Ovarian cyst    Pancreatic cancer (HCC)     Allergies  Allergen Reactions   Gabapentin Swelling   Quinolones Other (See Comments)   Ciprofloxacin Other (See Comments)    Hx of asc. Aortic dilitation.   Family hx of Marfans     Review of Systems:  [name] headache, visual changes, nausea, vomiting, diarrhea, constipation, dizziness, abdominal pain, skin rash, fevers, chills, night sweats, weight loss, swollen lymph nodes, body aches, joint swelling, chest pain, shortness of breath, mood changes. POSITIVE muscle aches  Objective  Blood pressure 102/78, pulse 100, height '5\' 1"'$  (1.549 m), weight 140 lb (63.5 kg), last menstrual period 06/28/2016, SpO2 99 %.   General: [name] apparent distress alert and oriented x3 mood and affect normal, dressed appropriately.  HEENT: Pupils  equal, extraocular movements intact  Respiratory: Patient's speak in full sentences and does not appear short of breath  Cardiovascular: [name] lower extremity edema, non tender, [name] erythema  Back exam does have significant loss of lordosis.  Tightness noted.  Negative straight leg test noted.  Tenderness to palpation in the paraspinal musculature.  Osteopathic findings  C2 flexed rotated and side bent right C4 flexed rotated and side bent left T3 extended rotated and side bent right inhaled rib T4 extended rotated and side bent left inhaled   L2 flexed rotated and side bent right Sacrum right on right       Assessment and Plan:  Lumbar radiculopathy Increasing changes noted today.  Does have some pain when seems to be going on the legs.  Encourage patient to be careful with lifting mechanics.  We discussed icing regimen and home exercises, which activities to do and which ones to avoid, increase activity slowly.  Follow-up with me again in 6 to 8 weeks.  Patient will be traveling because we will have to go to help her father's estate.    Nonallopathic problems  Decision today to treat with OMT was based on Physical Exam  After verbal consent patient was treated with HVLA, ME, FPR techniques in cervical, rib, thoracic, lumbar, and sacral  areas  Patient tolerated the procedure well with improvement in symptoms  Patient given exercises, stretches and lifestyle modifications  See medications in patient instructions if given  Patient will follow up in 4-8 weeks  The above documentation has been reviewed and is accurate and complete [name], DO          Note: This dictation was prepared with Dragon dictation along with smaller phrase technology. Any transcriptional errors that result from this process are unintentional.

## 2022-02-25 NOTE — Patient Instructions (Addendum)
Sorry about your loss See you when you get back

## 2022-03-08 ENCOUNTER — Other Ambulatory Visit: Payer: Self-pay

## 2022-03-08 ENCOUNTER — Encounter: Payer: Self-pay | Admitting: Family Medicine

## 2022-03-08 DIAGNOSIS — G8929 Other chronic pain: Secondary | ICD-10-CM

## 2022-03-09 ENCOUNTER — Ambulatory Visit (INDEPENDENT_AMBULATORY_CARE_PROVIDER_SITE_OTHER): Payer: 59

## 2022-03-09 DIAGNOSIS — G8929 Other chronic pain: Secondary | ICD-10-CM

## 2022-03-09 DIAGNOSIS — M79644 Pain in right finger(s): Secondary | ICD-10-CM

## 2022-03-15 NOTE — Progress Notes (Unsigned)
[place] Phone: [phone] Subjective:   [name], am serving as a scribe for Dr. Hulan Saas.  I'm seeing this patient by the request  of:  [name], MD  CC: right thumb pain   [id number]  02/25/22 OMT  Updated 03/16/2022 Tamara [name] is a 62 y.o. female coming in with complaint of right thumb pain. Pain seems to be increasing in [facility]. Pain is constant. Using brace day and night which has helped.   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     Past Medical History:  Diagnosis Date   Allergic rhinitis, mild    Anemia    Depression    Diabetes mellitus without complication ([place])    Hypertension    Ovarian cyst    Pancreatic cancer [facility])    Past Surgical History:  Procedure Laterality Date   LAMINECTOMY     LUMBAR EPIDURAL INJECTION  11/03/2014   Social History   Socioeconomic History   Marital status: Married    Spouse name: Not on file   Number of children: 0   Years of education: Not on file   Highest education level: Not on file  Occupational History   Not on file  Tobacco Use   Smoking status: Never   Smokeless tobacco: Never  Vaping Use   Vaping Use: Never used  Substance and Sexual Activity   Alcohol use: Yes    Alcohol/week: 3.0 standard drinks of alcohol    Types: 3 Standard drinks or equivalent per week   Drug use: No   Sexual activity: Yes    Partners: Male    Birth control/protection: I.U.D.  Other Topics Concern   Not on file  Social History Narrative   Not on file   Social Determinants of Health   Financial Resource Strain: Not on file  Food Insecurity: Not on file  Transportation Needs: Not on file  Physical Activity: Not on file  Stress: Not on file  Social Connections: Not on file   Allergies  Allergen Reactions   Gabapentin Swelling   Quinolones Other (See Comments)   Ciprofloxacin Other  (See Comments)    Hx of asc. Aortic dilitation.   Family hx of Marfans   Family History  Problem Relation Age of Onset   Depression Mother    Depression Father    Alcohol abuse Father    Pancreatic cancer Father     Current Outpatient Medications (Endocrine & Metabolic):    levothyroxine (SYNTHROID) 50 MCG tablet, Take 1 tablet (50 mcg total) by mouth daily.  Current Outpatient Medications (Cardiovascular):    losartan (COZAAR) 100 MG tablet, TAKE 1 TABLET(100 MG) BY MOUTH DAILY  Current Outpatient Medications (Respiratory):    cetirizine (ZYRTEC) 10 MG tablet, Take 1 tablet by mouth daily.   fluticasone (FLONASE ALLERGY RELIEF) 50 MCG/ACT nasal spray, Flonase  Current Outpatient Medications (Analgesics):    meloxicam (MOBIC) 15 MG tablet, TAKE 1 TABLET(15 MG) BY MOUTH DAILY  Current Outpatient Medications (Hematological):    vitamin B-12 (CYANOCOBALAMIN) 100 MCG tablet, Vitamin B12  Current Outpatient Medications (Other):    ALPRAZolam (XANAX) 0.5 MG tablet, TAKE 1 TABLET(0.5 MG) BY MOUTH THREE TIMES DAILY AS NEEDED FOR ANXIETY   Cholecalciferol (THERA-D 2000) 50 MCG (2000 UT) TABS, 1 tab by mouth once daily   Cholecalciferol (VITAMIN D) 10 MCG/ML LIQD, Vitamin D   hydrOXYzine (ATARAX) 10 MG tablet, TAKE 1 TABLET(10  MG) BY MOUTH THREE TIMES DAILY AS NEEDED   Probiotic Product (ALIGN PO), Take 1 tablet by mouth daily.   Probiotic Product (ALIGN PO), Align   venlafaxine XR (EFFEXOR-XR) 75 MG 24 hr capsule, TAKE 1 CAPSULE BY MOUTH DAILY WITH BREAKFAST     Objective  Blood pressure 122/88, pulse 94, height '5\' 1"'$  (1.549 m), weight 147 lb (66.7 kg), last menstrual period 06/28/2016, SpO2 97 %.   General: No apparent distress alert and oriented x3 mood and affect normal, dressed appropriately.  HEENT: Pupils equal, extraocular movements intact  Respiratory: Patient's speak in full sentences and does not appear short of breath  Cardiovascular: No lower extremity edema, non  tender, no erythema  Right thumb does have some mild swelling noted.  Positive grind test noted.   Procedure: Real-time Ultrasound Guided Injection of right CMC joint Device: GE Logiq Q7 Ultrasound guided injection is preferred based studies that show increased duration, increased effect, greater accuracy, decreased procedural pain, increased response rate, and decreased cost with ultrasound guided versus blind injection.  Verbal informed consent obtained.  Time-out conducted.  Noted no overlying erythema, induration, or other signs of local infection.  Skin prepped in a sterile fashion.  Local anesthesia: Topical Ethyl chloride.  With sterile technique and under real time ultrasound guidance: With a 25-gauge half inch needle injected with 0.5 cc of 0.5% Marcaine and 0.5 cc of Kenalog 40 mg/mL Completed without difficulty  Pain immediately resolved suggesting accurate placement of the medication.  Advised to call if fevers/chills, erythema, induration, drainage, or persistent bleeding.  Impression: Technically successful ultrasound guided injection. Was unable to fill it to save the picture so will not bill for the ultrasound.   Impression and Recommendations:     The above documentation has been reviewed and is accurate and complete [name], DO

## 2022-03-16 ENCOUNTER — Encounter: Payer: Self-pay | Admitting: Family Medicine

## 2022-03-16 ENCOUNTER — Ambulatory Visit (INDEPENDENT_AMBULATORY_CARE_PROVIDER_SITE_OTHER): Payer: Self-pay | Admitting: Family Medicine

## 2022-03-16 ENCOUNTER — Ambulatory Visit: Payer: Self-pay

## 2022-03-16 VITALS — BP 122/88 | HR 94 | Ht 61.0 in | Wt 147.0 lb

## 2022-03-16 DIAGNOSIS — M79644 Pain in right finger(s): Secondary | ICD-10-CM

## 2022-03-16 NOTE — Patient Instructions (Addendum)
Injected hand today

## 2022-03-27 ENCOUNTER — Encounter: Payer: Self-pay | Admitting: Family Medicine

## 2022-03-29 ENCOUNTER — Ambulatory Visit: Payer: 59 | Admitting: Family Medicine

## 2022-03-31 ENCOUNTER — Ambulatory Visit: Payer: 59 | Admitting: Family Medicine

## 2022-04-12 ENCOUNTER — Other Ambulatory Visit: Payer: Self-pay | Admitting: Family Medicine

## 2022-04-25 ENCOUNTER — Other Ambulatory Visit: Payer: Self-pay | Admitting: Internal Medicine

## 2022-04-25 DIAGNOSIS — Z1231 Encounter for screening mammogram for malignant neoplasm of breast: Secondary | ICD-10-CM

## 2022-04-27 ENCOUNTER — Encounter: Payer: Self-pay | Admitting: Family Medicine

## 2022-04-27 ENCOUNTER — Other Ambulatory Visit: Payer: Self-pay

## 2022-04-27 MED ORDER — MELOXICAM 15 MG PO TABS: ORAL_TABLET | ORAL | 0 refills | Status: DC

## 2022-04-27 MED ORDER — VENLAFAXINE HCL ER 75 MG PO CP24: 75.0000 mg | ORAL_CAPSULE | Freq: Every day | ORAL | 2 refills | Status: DC

## 2022-04-27 NOTE — Telephone Encounter (Signed)
Thank you.  I have her on the wait list.

## 2022-05-02 ENCOUNTER — Encounter: Payer: Self-pay | Admitting: Internal Medicine

## 2022-05-16 ENCOUNTER — Encounter: Payer: Self-pay | Admitting: Internal Medicine

## 2022-05-16 MED ORDER — ALPRAZOLAM 0.5 MG PO TABS: ORAL_TABLET | ORAL | 2 refills | Status: DC

## 2022-05-24 ENCOUNTER — Encounter: Payer: Self-pay | Admitting: Family Medicine

## 2022-05-24 ENCOUNTER — Ambulatory Visit: Payer: Commercial Managed Care - HMO | Admitting: Family Medicine

## 2022-05-24 VITALS — BP 100/68 | HR 88 | Ht 61.0 in | Wt 143.0 lb

## 2022-05-24 DIAGNOSIS — M999 Biomechanical lesion, unspecified: Secondary | ICD-10-CM

## 2022-05-24 DIAGNOSIS — M48061 Spinal stenosis, lumbar region without neurogenic claudication: Secondary | ICD-10-CM

## 2022-05-24 DIAGNOSIS — M9903 Segmental and somatic dysfunction of lumbar region: Secondary | ICD-10-CM

## 2022-05-24 DIAGNOSIS — M9902 Segmental and somatic dysfunction of thoracic region: Secondary | ICD-10-CM

## 2022-05-24 DIAGNOSIS — M9904 Segmental and somatic dysfunction of sacral region: Secondary | ICD-10-CM

## 2022-05-24 DIAGNOSIS — M9908 Segmental and somatic dysfunction of rib cage: Secondary | ICD-10-CM

## 2022-05-24 DIAGNOSIS — M1811 Unilateral primary osteoarthritis of first carpometacarpal joint, right hand: Secondary | ICD-10-CM | POA: Diagnosis not present

## 2022-05-24 DIAGNOSIS — M9901 Segmental and somatic dysfunction of cervical region: Secondary | ICD-10-CM

## 2022-05-24 NOTE — Patient Instructions (Addendum)
Good to see you  Splint given today  Follow up in 6-8 weeks

## 2022-05-24 NOTE — Assessment & Plan Note (Signed)
Patient on the left side replaced.  Did not respond as well to the injection previously.  We discussed with patient and given a brace that I think will be beneficial.  Discussed icing regimen and home exercises otherwise.  Follow-up again in 6 to 8 weeks

## 2022-05-24 NOTE — Progress Notes (Signed)
[name] [address] [place] [address] Phone: [phone] Subjective:   Tamara [name], am serving as a scribe for Dr. Antoine Primas.  I'm seeing this patient by the request  of:  Corwin Levins, MD  CC: Low back pain, right thumb pain  [id number]  Tamara [name] is a 62 y.o. female coming in with complaint of neck and back pain. Today patient states that here for routine OMT but the injection in her hand only last about a week to a week and a half.  Patient states that the thumb is continuing to give her discomfort and pain.  Starting to affect daily activities.  Still not as bad as her contralateral side was before she needed replacement.       Past Medical History:  Diagnosis Date   Allergic rhinitis, mild    Anemia    Depression    Diabetes mellitus without complication (HCC)    Hypertension    Ovarian cyst    Pancreatic cancer [facility])    Past Surgical History:  Procedure Laterality Date   LAMINECTOMY     LUMBAR EPIDURAL INJECTION  11/03/2014   Social History   Socioeconomic History   Marital status: Married    Spouse name: Not on file   Number of children: 0   Years of education: Not on file   Highest education level: Not on file  Occupational History   Not on file  Tobacco Use   Smoking status: Never   Smokeless tobacco: Never  Vaping Use   Vaping Use: Never used  Substance and Sexual Activity   Alcohol use: Yes    Alcohol/week: 3.0 standard drinks of alcohol    Types: 3 Standard drinks or equivalent per week   Drug use: No   Sexual activity: Yes    Partners: Male    Birth control/protection: I.U.D.  Other Topics Concern   Not on file  Social History Narrative   Not on file   Social Determinants of Health   Financial Resource Strain: Not on file  Food Insecurity: Not on file  Transportation Needs: Not on file  Physical Activity: Not on file  Stress: Not on file  Social Connections: Not on  file   Allergies  Allergen Reactions   Gabapentin Swelling   Quinolones Other (See Comments)   Ciprofloxacin Other (See Comments)    Hx of asc. Aortic dilitation.   Family hx of Marfans   Family History  Problem Relation Age of Onset   Depression Mother    Depression Father    Alcohol abuse Father    Pancreatic cancer Father     Current Outpatient Medications (Endocrine & Metabolic):    levothyroxine (SYNTHROID) 50 MCG tablet, Take 1 tablet (50 mcg total) by mouth daily.  Current Outpatient Medications (Cardiovascular):    losartan (COZAAR) 100 MG tablet, TAKE 1 TABLET(100 MG) BY MOUTH DAILY  Current Outpatient Medications (Respiratory):    cetirizine (ZYRTEC) 10 MG tablet, Take 1 tablet by mouth daily.   fluticasone (FLONASE ALLERGY RELIEF) 50 MCG/ACT nasal spray, Flonase  Current Outpatient Medications (Analgesics):    meloxicam (MOBIC) 15 MG tablet, TAKE 1 TABLET(15 MG) BY MOUTH DAILY  Current Outpatient Medications (Hematological):    vitamin B-12 (CYANOCOBALAMIN) 100 MCG tablet, Vitamin B12  Current Outpatient Medications (Other):    ALPRAZolam (XANAX) 0.5 MG tablet, TAKE 1 TABLET(0.5 MG) BY MOUTH THREE TIMES DAILY AS NEEDED FOR ANXIETY   Cholecalciferol (THERA-D 2000) 50  MCG (2000 UT) TABS, 1 tab by mouth once daily   Cholecalciferol (VITAMIN D) 10 MCG/ML LIQD, Vitamin D   hydrOXYzine (ATARAX) 10 MG tablet, TAKE 1 TABLET(10 MG) BY MOUTH THREE TIMES DAILY AS NEEDED   Probiotic Product (ALIGN PO), Take 1 tablet by mouth daily.   Probiotic Product (ALIGN PO), Align   venlafaxine XR (EFFEXOR-XR) 75 MG 24 hr capsule, Take 1 capsule (75 mg total) by mouth daily with breakfast.   Reviewed prior external information including notes and imaging from  primary care provider As well as notes that were available from care everywhere and other healthcare systems.  Past medical history, social, surgical and family history all reviewed in electronic medical record.  No pertanent  information unless stated regarding to the chief complaint.   Review of Systems:  No headache, visual changes, nausea, vomiting, diarrhea, constipation, dizziness, abdominal pain, skin rash, fevers, chills, night sweats, weight loss, swollen lymph nodes, body aches, joint swelling, chest pain, shortness of breath, mood changes. POSITIVE muscle aches  Objective  Height 5\' 1"  (1.549 m), last menstrual period 06/28/2016.   General: No apparent distress alert and oriented x3 mood and affect normal, dressed appropriately.  HEENT: Pupils equal, extraocular movements intact  Respiratory: Patient's speak in full sentences and does not appear short of breath  Cardiovascular: No lower extremity edema, non tender, no erythema  Back exam does have some loss lordosis.  Patient is tender to palpation mostly in the lumbosacral area.  Does have only 5 degrees of extension of the back.  Tightness still of the hip flexors bilaterally.  Patient does have some atrophy of the [facility] joint.  Positive crepitus noted with range of motion.  Osteopathic findings  C2 flexed rotated and side bent right C4 flexed rotated and side bent left C6 flexed rotated and side bent left T3 extended rotated and side bent right inhaled third rib T9 extended rotated and side bent left L2 flexed rotated and side bent right L5 flexed rotated and side bent left Sacrum right on right      Impression and Recommendations:     The above documentation has been reviewed and is accurate and complete Tamara Saa, DO

## 2022-05-24 NOTE — Assessment & Plan Note (Signed)

## 2022-05-24 NOTE — Assessment & Plan Note (Signed)
Known stenosis.  Patient has responded relatively well though to osteopathic manipulation previously.  Discussed posture and ergonomics, discussed which activities to do and which ones to avoid, increase activity slowly otherwise.  Follow-up again in 6 to 8 weeks

## 2022-05-30 ENCOUNTER — Ambulatory Visit (INDEPENDENT_AMBULATORY_CARE_PROVIDER_SITE_OTHER): Payer: Self-pay

## 2022-05-30 DIAGNOSIS — Z719 Counseling, unspecified: Secondary | ICD-10-CM

## 2022-06-02 ENCOUNTER — Ambulatory Visit: Payer: Self-pay | Admitting: Family Medicine

## 2022-06-08 ENCOUNTER — Ambulatory Visit
Admission: RE | Admit: 2022-06-08 | Discharge: 2022-06-08 | Disposition: A | Discharge status: Home / Self Care | Payer: Commercial Managed Care - HMO | Source: Ambulatory Visit | Attending: Internal Medicine | Admitting: Internal Medicine

## 2022-06-08 DIAGNOSIS — Z1231 Encounter for screening mammogram for malignant neoplasm of breast: Secondary | ICD-10-CM

## 2022-06-23 ENCOUNTER — Encounter (HOSPITAL_COMMUNITY): Payer: Self-pay | Admitting: Emergency Medicine

## 2022-06-23 ENCOUNTER — Ambulatory Visit (HOSPITAL_COMMUNITY)
Admission: EM | Admit: 2022-06-23 | Discharge: 2022-06-23 | Disposition: A | Discharge status: Home / Self Care | Payer: Commercial Managed Care - HMO | Attending: Emergency Medicine | Admitting: Emergency Medicine

## 2022-06-23 DIAGNOSIS — L237 Allergic contact dermatitis due to plants, except food: Secondary | ICD-10-CM

## 2022-06-23 MED ORDER — PREDNISONE 10 MG (21) PO TBPK: ORAL_TABLET | Freq: Every day | ORAL | 0 refills | Status: DC

## 2022-06-23 NOTE — ED Triage Notes (Signed)
Pt was working in yard over the weekend. Reports rash since Sunday. Tried OTC medications without relief.
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
## 2022-06-23 NOTE — Discharge Instructions (Signed)
Take the prednisone taper as prescribed 6 pills on day 1 and 2 5 pills on day 3 and 4 4 pills on day 5 and 6 3 pills on day 7 and 8 2 pills on day 9 and 10 1 pill on day 11 and 12  Please return if symptoms persist or worsen

## 2022-06-24 ENCOUNTER — Encounter: Payer: Self-pay | Admitting: Family Medicine

## 2022-06-28 ENCOUNTER — Other Ambulatory Visit: Payer: Self-pay

## 2022-06-28 MED ORDER — HYDROXYZINE HCL 10 MG PO TABS: ORAL_TABLET | ORAL | 0 refills | Status: DC

## 2022-07-04 ENCOUNTER — Encounter: Payer: Self-pay | Admitting: Family Medicine

## 2022-07-05 ENCOUNTER — Ambulatory Visit (INDEPENDENT_AMBULATORY_CARE_PROVIDER_SITE_OTHER): Payer: Self-pay | Admitting: Plastic Surgery

## 2022-07-05 ENCOUNTER — Ambulatory Visit: Payer: Commercial Managed Care - HMO | Admitting: Family Medicine

## 2022-07-05 ENCOUNTER — Encounter: Payer: Self-pay | Admitting: Family Medicine

## 2022-07-05 ENCOUNTER — Encounter: Payer: Self-pay | Admitting: Plastic Surgery

## 2022-07-05 VITALS — BP 120/82 | HR 97 | Ht 61.0 in | Wt 143.0 lb

## 2022-07-05 DIAGNOSIS — M549 Dorsalgia, unspecified: Secondary | ICD-10-CM | POA: Diagnosis not present

## 2022-07-05 DIAGNOSIS — M9904 Segmental and somatic dysfunction of sacral region: Secondary | ICD-10-CM | POA: Diagnosis not present

## 2022-07-05 DIAGNOSIS — M9902 Segmental and somatic dysfunction of thoracic region: Secondary | ICD-10-CM

## 2022-07-05 DIAGNOSIS — M9903 Segmental and somatic dysfunction of lumbar region: Secondary | ICD-10-CM | POA: Diagnosis not present

## 2022-07-05 DIAGNOSIS — Z719 Counseling, unspecified: Secondary | ICD-10-CM

## 2022-07-05 DIAGNOSIS — M9908 Segmental and somatic dysfunction of rib cage: Secondary | ICD-10-CM | POA: Diagnosis not present

## 2022-07-05 DIAGNOSIS — M9901 Segmental and somatic dysfunction of cervical region: Secondary | ICD-10-CM

## 2022-07-05 NOTE — Progress Notes (Signed)
The patient is a very sweet lady who is interested in facial rejuvenation.  She has a main concern about her neck area.  It runs in the family.  She has a slight bit of fat but mostly it is loose skin.  She would benefit from [facility] and laser but probably and then the neck lift is good to give her the biggest improvement.  However the laser would have to help with stimulating collagen and that would help with the overall health of her skin.  She would like to move ahead with that and we can give her some pricing for that and I will need to call in the numbing medicine ahead of time.  Pictures were obtained of the patient and placed in the chart with the patient's or guardian's permission.

## 2022-07-05 NOTE — Progress Notes (Unsigned)
[name] [address] [place] [address] Phone: [phone] Subjective:   Bruce Donath, am serving as a scribe for Dr. Antoine Primas.  I'm seeing this patient by the request  of:  Corwin Levins, MD  CC: Back and neck pain follow-up  [id number]  Tamara [name] is a 62 y.o. female coming in with complaint of back and neck pain. OMT 05/24/2022. Also f/u for R thumb pain. Patient states that the splint is helping but her pain is not improving.  Patient knows that she has arthritic changes but would like to hold off on the replacement like she had on the contralateral side for another year if possible.  Medications patient has been prescribed:   Taking:         Reviewed prior external information including notes and imaging from previsou exam, outside providers and external EMR if available.   As well as notes that were available from care everywhere and other healthcare systems.  Past medical history, social, surgical and family history all reviewed in electronic medical record.  No pertanent information unless stated regarding to the chief complaint.   Past Medical History:  Diagnosis Date   Allergic rhinitis, mild    Anemia    Depression    Hypertension    Ovarian cyst     Allergies  Allergen Reactions   Gabapentin Swelling   Quinolones Other (See Comments)   Ciprofloxacin Other (See Comments)    Hx of asc. Aortic dilitation.   Family hx of Marfans     Review of Systems:  No headache, visual changes, nausea, vomiting, diarrhea, constipation, dizziness, abdominal pain, skin rash, fevers, chills, night sweats, weight loss, swollen lymph nodes, body aches, joint swelling, chest pain, shortness of breath, mood changes. POSITIVE muscle aches  Objective  Blood pressure 120/82, pulse 97, height 5\' 1"  (1.549 m), weight 143 lb (64.9 kg), last menstrual period 06/28/2016, SpO2 98 %.   General: No apparent distress alert and  oriented x3 mood and affect normal, dressed appropriately.  HEENT: Pupils equal, extraocular movements intact  Respiratory: Patient's speak in full sentences and does not appear short of breath  Cardiovascular: No lower extremity edema, non tender, no erythema  Low back exam does have some loss of lordosis.  Tightness noted in the parascapular area right greater than left.  Osteopathic findings  C3 flexed rotated and side bent right C7 flexed rotated and side bent left T3 extended rotated and side bent right inhaled rib T8 extended rotated and side bent left L2 flexed rotated and side bent right Sacrum right on right       Assessment and Plan:  Upper back pain Seem to have more upper back tightness than previous.  Discussed with patient about icing regimen and home exercises, discussed which activities to do and which ones to avoid.  Increase activity slowly over the course of next several weeks.  Follow-up again in 6 to 8 weeks.    Nonallopathic problems  Decision today to treat with OMT was based on Physical Exam  After verbal consent patient was treated with HVLA, ME, FPR techniques in cervical, rib, thoracic, lumbar, and sacral  areas  Patient tolerated the procedure well with improvement in symptoms  Patient given exercises, stretches and lifestyle modifications  See medications in patient instructions if given  Patient will follow up in 4-8 weeks     The above documentation has been reviewed and is accurate and complete Clifton Custard  Katrinka Blazing, DO         Note: This dictation was prepared with Dragon dictation along with smaller phrase technology. Any transcriptional errors that result from this process are unintentional.

## 2022-07-06 ENCOUNTER — Encounter: Payer: Self-pay | Admitting: Family Medicine

## 2022-07-06 NOTE — Assessment & Plan Note (Signed)
Seem to have more upper back tightness than previous.  Discussed with patient about icing regimen and home exercises, discussed which activities to do and which ones to avoid.  Increase activity slowly over the course of next several weeks.  Follow-up again in 6 to 8 weeks.

## 2022-07-18 ENCOUNTER — Telehealth: Payer: Self-pay | Admitting: Cardiovascular Disease

## 2022-07-18 ENCOUNTER — Encounter: Payer: Self-pay | Admitting: Family Medicine

## 2022-07-18 DIAGNOSIS — I10 Essential (primary) hypertension: Secondary | ICD-10-CM

## 2022-07-18 DIAGNOSIS — I77819 Aortic ectasia, unspecified site: Secondary | ICD-10-CM

## 2022-07-18 NOTE — Telephone Encounter (Signed)
Per OV note on 12/14/20: We discussed follow up - will likely see in 2-3 years  She wants follow up of her throacic aorta - very strong family hx of aortic dilatation .

## 2022-07-18 NOTE — Telephone Encounter (Signed)
New Message:    Patient would  like to have a Coronary Calcium Scoring Test and a Carotid Vascular screening test. She said because of her family history, her brother's Cardiologist recommended these tests. Does Dr Elease Hashimoto feels like she need these tests also?

## 2022-07-22 NOTE — Telephone Encounter (Signed)
Spoke to Dr Elease Hashimoto via secure chat who states he is fine with ordering calcium score ct. Prior to ordering a carotid doppler, he would like to see patient in office. Has appt currently scheduled on 12/19/22. Returned call to patient, left detailed message per DPR. Order placed for CT and she will be called to schedule.

## 2022-07-26 ENCOUNTER — Other Ambulatory Visit: Payer: Self-pay | Admitting: Family Medicine

## 2022-07-27 ENCOUNTER — Encounter: Payer: Self-pay | Admitting: Family Medicine

## 2022-07-27 ENCOUNTER — Other Ambulatory Visit: Payer: Self-pay

## 2022-07-27 ENCOUNTER — Encounter: Payer: Self-pay | Admitting: Internal Medicine

## 2022-07-27 MED ORDER — MELOXICAM 15 MG PO TABS: ORAL_TABLET | ORAL | 0 refills | Status: DC

## 2022-08-14 ENCOUNTER — Other Ambulatory Visit: Payer: Self-pay | Admitting: Family Medicine

## 2022-08-29 NOTE — Progress Notes (Signed)
[name] [address] [place] [address] Phone: [phone] Subjective:   Tamara [name], am serving as a scribe for Dr. Antoine Primas.  I'm seeing this patient by the request  of:  Corwin Levins, MD  CC: Low back and neck pain follow-up  [id number]  Tamara [name] is a 62 y.o. female coming in with complaint of back and neck pain. OMT on 07/05/2022. Patient states same per usual. No new concerns.  Medications patient has been prescribed: hydroxyzine mobic  Taking: Yes         Reviewed prior external information including notes and imaging from previsou exam, outside providers and external EMR if available.   As well as notes that were available from care everywhere and other healthcare systems.  Past medical history, social, surgical and family history all reviewed in electronic medical record.  No pertanent information unless stated regarding to the chief complaint.   Past Medical History:  Diagnosis Date   Allergic rhinitis, mild    Anemia    Depression    Hypertension    Ovarian cyst     Allergies  Allergen Reactions   Gabapentin Swelling   Quinolones Other (See Comments)   Ciprofloxacin Other (See Comments)    Hx of asc. Aortic dilitation.   Family hx of Marfans     Review of Systems:  No headache, visual changes, nausea, vomiting, diarrhea, constipation, dizziness, abdominal pain, skin rash, fevers, chills, night sweats, weight loss, swollen lymph nodes, body aches, joint swelling, chest pain, shortness of breath, mood changes. POSITIVE muscle aches  Objective  Blood pressure 118/80, pulse (!) 108, height 5' (1.524 m), weight 141 lb (64 kg), last menstrual period 06/28/2016, SpO2 96%.   General: No apparent distress alert and oriented x3 mood and affect normal, dressed appropriately.  HEENT: Pupils equal, extraocular movements intact  Respiratory: Patient's speak in full sentences and does not appear short  of breath  Cardiovascular: No lower extremity edema, non tender, no erythema  Pain more in the upper back than usual.  Seems to be on the right side of the parascapular area.  Seems to go to the neck near the occipital area.  Does have some limited sidebending noted.  Osteopathic findings  C3 flexed rotated and side bent right C5 flexed rotated and side bent right T3 extended rotated and side bent right inhaled rib L2 flexed rotated and side bent right L5 flexed rotated and side bent left Sacrum right on right     Assessment and Plan:  Upper back pain Mild increase in the upper back pain noted again today.  Seems to be is some associated with the neck as well.  Discussed with patient about posture and ergonomics, discussed with patient that we could do potential trigger point injections if necessary.  Will continue to monitor the radicular symptoms.  Follow-up with me again in 6 to 8 weeks otherwise.    Nonallopathic problems  Decision today to treat with OMT was based on Physical Exam  After verbal consent patient was treated with HVLA, ME, FPR techniques in cervical, rib, thoracic, lumbar, and sacral  areas  Patient tolerated the procedure well with improvement in symptoms  Patient given exercises, stretches and lifestyle modifications  See medications in patient instructions if given  Patient will follow up in 4-8 weeks     The above documentation has been reviewed and is accurate and complete Judi Saa, DO  Note: This dictation was prepared with Dragon dictation along with smaller phrase technology. Any transcriptional errors that result from this process are unintentional.

## 2022-09-06 ENCOUNTER — Ambulatory Visit: Payer: Commercial Managed Care - HMO | Admitting: Family Medicine

## 2022-09-06 ENCOUNTER — Encounter: Payer: Self-pay | Admitting: Family Medicine

## 2022-09-06 ENCOUNTER — Encounter: Payer: Self-pay | Admitting: Radiology

## 2022-09-06 ENCOUNTER — Ambulatory Visit (INDEPENDENT_AMBULATORY_CARE_PROVIDER_SITE_OTHER): Payer: Commercial Managed Care - HMO | Admitting: Radiology

## 2022-09-06 VITALS — BP 118/80 | HR 108 | Ht 60.0 in | Wt 141.0 lb

## 2022-09-06 VITALS — BP 114/78 | Ht 60.5 in | Wt 141.0 lb

## 2022-09-06 DIAGNOSIS — Z01419 Encounter for gynecological examination (general) (routine) without abnormal findings: Secondary | ICD-10-CM | POA: Diagnosis not present

## 2022-09-06 DIAGNOSIS — M549 Dorsalgia, unspecified: Secondary | ICD-10-CM

## 2022-09-06 DIAGNOSIS — M9908 Segmental and somatic dysfunction of rib cage: Secondary | ICD-10-CM

## 2022-09-06 DIAGNOSIS — M9902 Segmental and somatic dysfunction of thoracic region: Secondary | ICD-10-CM | POA: Diagnosis not present

## 2022-09-06 DIAGNOSIS — M9901 Segmental and somatic dysfunction of cervical region: Secondary | ICD-10-CM | POA: Diagnosis not present

## 2022-09-06 DIAGNOSIS — M9903 Segmental and somatic dysfunction of lumbar region: Secondary | ICD-10-CM

## 2022-09-06 DIAGNOSIS — M9904 Segmental and somatic dysfunction of sacral region: Secondary | ICD-10-CM

## 2022-09-06 NOTE — Assessment & Plan Note (Signed)
Mild increase in the upper back pain noted again today.  Seems to be is some associated with the neck as well.  Discussed with patient about posture and ergonomics, discussed with patient that we could do potential trigger point injections if necessary.  Will continue to monitor the radicular symptoms.  Follow-up with me again in 6 to 8 weeks otherwise.

## 2022-09-06 NOTE — Patient Instructions (Signed)
Watch the ergonomics with bottle feeding See you again in 10-12 weeks

## 2022-09-06 NOTE — Progress Notes (Signed)
   Tamara [name] [birth year] [id number]   History: Postmenopausal 62 y.o. presents for annual exam. No gyn concerns.    Gynecologic History Postmenopausal Last Pap: 2021. Results were: normal Last mammogram: 4/24. Results were: normal Last colonoscopy: 2017   Obstetric History OB History  Gravida Para Term Preterm AB Living  1       1 0  SAB IAB Ectopic Multiple Live Births  1            # Outcome Date GA Lbr Len/2nd Weight Sex Type Anes PTL Lv  1 SAB      SAB   FD     The following portions of the patient's history were reviewed and updated as appropriate: allergies, current medications, past family history, past medical history, past social history, past surgical history, and problem list.  Review of Systems Pertinent items noted in HPI and remainder of comprehensive ROS otherwise negative.  Past medical history, past surgical history, family history and social history were all reviewed and documented in the EPIC chart.  Exam:  Vitals:   09/06/22 1129  BP: 114/78  Weight: 141 lb (64 kg)  Height: 5' 0.5" (1.537 m)   Body mass index is 27.08 kg/m.  General appearance:  Normal Thyroid:  Symmetrical, normal in size, without palpable masses or nodularity. Respiratory  Auscultation:  Clear without wheezing or rhonchi Cardiovascular  Auscultation:  Regular rate, without rubs, murmurs or gallops  Edema/varicosities:  Not grossly evident Abdominal  Soft,nontender, without masses, guarding or rebound.  Liver/spleen:  No organomegaly noted  Hernia:  None appreciated  Skin  Inspection:  Grossly normal Breasts: Examined lying and sitting.   Right: Without masses, retractions, nipple discharge or axillary adenopathy.   Left: Without masses, retractions, nipple discharge or axillary adenopathy. Genitourinary   Inguinal/mons:  Normal without inguinal adenopathy  External genitalia:  Normal appearing vulva with no masses, tenderness, or lesions  BUS/Urethra/Skene's  glands:  Normal  Vagina:  Normal appearing with normal color and discharge, no lesions. Atrophy: moderate   Cervix:  Normal appearing without discharge or lesions  Uterus:  Normal in size, shape and contour.  Midline and mobile, nontender  Adnexa/parametria:     Rt: Normal in size, without masses or tenderness.   Lt: Normal in size, without masses or tenderness.  Anus and perineum: Normal    Tamara [name], CMA present for exam  Assessment/Plan:   1. Well woman exam with routine gynecological exam Pap 2025 Mammo yearly Colonoscopy up to date Labs with PCP    Discussed SBE, colonoscopy and DEXA screening as directed. Recommend of exercise weekly, including weight bearing exercise. Encouraged the use of seatbelts and sunscreen.  Return in 1 year for annual or sooner prn.  Arlie Solomons B WHNP-BC, 11:45 AM 09/06/2022

## 2022-09-14 ENCOUNTER — Encounter: Payer: Self-pay | Admitting: Plastic Surgery

## 2022-09-15 ENCOUNTER — Other Ambulatory Visit (HOSPITAL_COMMUNITY): Payer: Self-pay

## 2022-09-15 ENCOUNTER — Telehealth: Payer: Self-pay

## 2022-09-15 ENCOUNTER — Telehealth: Payer: Self-pay | Admitting: Plastic Surgery

## 2022-09-15 MED ORDER — LIDOCAINE 23% - TETRACAINE 7% TOPICAL OINTMENT (PLASTICIZED)
1.0000 | TOPICAL_OINTMENT | Freq: Once | CUTANEOUS | 0 refills | Status: AC
  Filled 2022-09-15: qty 60, 30d supply, fill #0

## 2022-09-15 NOTE — Telephone Encounter (Signed)
Pt called and has procedure tomorrow. Pharmacy ([facility] on [address]) does not have Rx she needs. Pt also has question about instructions for prep.  Please call her at [phone]

## 2022-09-15 NOTE — Telephone Encounter (Signed)
Pt called with questions about her laser appt tomorrow @ 2 pm with Dr. Ulice Bold. Will someone please call pt back? Thanks!   Call back # [phone].  Thanks!

## 2022-09-15 NOTE — Telephone Encounter (Signed)
I sent pt MyChart message response and also sent rx for her numbing medication

## 2022-09-16 ENCOUNTER — Ambulatory Visit (INDEPENDENT_AMBULATORY_CARE_PROVIDER_SITE_OTHER): Payer: Self-pay | Admitting: Plastic Surgery

## 2022-09-16 ENCOUNTER — Other Ambulatory Visit (HOSPITAL_COMMUNITY): Payer: Self-pay

## 2022-09-16 ENCOUNTER — Other Ambulatory Visit: Payer: Self-pay

## 2022-09-16 ENCOUNTER — Encounter: Payer: Self-pay | Admitting: Plastic Surgery

## 2022-09-16 DIAGNOSIS — Z719 Counseling, unspecified: Secondary | ICD-10-CM

## 2022-09-16 NOTE — Progress Notes (Signed)
HALO Treatment   Treatment  Settings:      in Media      Topical and/or Block: 32 % lidocaine/tetracine  Post Care: instructions given   Notes: Sente and fan given

## 2022-09-19 ENCOUNTER — Other Ambulatory Visit (HOSPITAL_COMMUNITY): Payer: Self-pay

## 2022-09-30 ENCOUNTER — Other Ambulatory Visit (HOSPITAL_COMMUNITY): Payer: Self-pay

## 2022-10-18 ENCOUNTER — Encounter: Payer: Self-pay | Admitting: Plastic Surgery

## 2022-10-18 ENCOUNTER — Ambulatory Visit (INDEPENDENT_AMBULATORY_CARE_PROVIDER_SITE_OTHER): Payer: Self-pay | Admitting: Plastic Surgery

## 2022-10-18 DIAGNOSIS — Z719 Counseling, unspecified: Secondary | ICD-10-CM

## 2022-10-18 NOTE — Progress Notes (Signed)
Patient is here for follow-up after undergoing a halo 1 month ago.  She looks great and is doing really well.  She is still thinking about maybe Kybella and may be neck lift.  I can send her the information for that.  Pictures were obtained of the patient and placed in the chart with the patient's or guardian's permission.

## 2022-10-31 ENCOUNTER — Other Ambulatory Visit: Payer: Self-pay

## 2022-10-31 MED ORDER — INFLUENZA VIRUS VACC SPLIT PF (FLUZONE) 0.5 ML IM SUSY
0.5000 mL | PREFILLED_SYRINGE | Freq: Once | INTRAMUSCULAR | 0 refills | Status: AC
  Filled 2022-10-31: qty 0.5, 1d supply, fill #0

## 2022-10-31 MED ORDER — COVID-19 MRNA VAC-TRIS(PFIZER) 30 MCG/0.3ML IM SUSY
0.3000 mL | PREFILLED_SYRINGE | Freq: Once | INTRAMUSCULAR | 0 refills | Status: AC
  Filled 2022-10-31: qty 0.3, 1d supply, fill #0

## 2022-11-11 ENCOUNTER — Ambulatory Visit (INDEPENDENT_AMBULATORY_CARE_PROVIDER_SITE_OTHER): Payer: Self-pay | Admitting: Plastic Surgery

## 2022-11-11 DIAGNOSIS — Z719 Counseling, unspecified: Secondary | ICD-10-CM

## 2022-11-11 NOTE — Progress Notes (Signed)
   Subjective:    Patient ID: Tamara [name], female    DOB: 1961/01/14, 62 y.o.   MRN: [id number]  The patient is a 62 year old female joining me by phone.  We discussed Tamara Bella with the risks and benefits as compared to liposuction and surgery.  The patient had halo about a month ago and is quite pleased with the results to this point.  I think she will likely get more improvement in the next 2 months as well.      Review of Systems  Constitutional: Negative.   HENT: Negative.    Eyes: Negative.   Respiratory: Negative.    Cardiovascular: Negative.   Gastrointestinal: Negative.   Endocrine: Negative.   Genitourinary: Negative.   Musculoskeletal: Negative.        Objective:   Physical Exam      Assessment & Plan:     ICD-10-CM   1. Encounter for counseling  Z71.9       I connected with  Tamara [name] on 11/11/22 by phone and verified that I am speaking with the correct person using two identifiers.  The patient was at home and I was at the office.  We spent 5 minutes in discussion.  The patient would like to move ahead with Tamara [name] for her neck.   I discussed the limitations of evaluation and management by telemedicine. The patient expressed understanding and agreed to proceed.

## 2022-11-21 NOTE — Progress Notes (Unsigned)
[name] [address] [place] [address] Phone: [phone] Subjective:    I'm seeing this patient by the request  of:  Corwin Levins, MD  CC: Back and neck pain follow-up  [id number]  Tamara [name] is a 62 y.o. female coming in with complaint of back and neck pain. OMT on 09/06/2022. Patient states routine OMT.  Has been doing relatively well.  Is going to likely have surgery on her thumb in the near future.  Medications patient has been prescribed: mobic  Taking:         Reviewed prior external information including notes and imaging from previsou exam, outside providers and external EMR if available.   As well as notes that were available from care everywhere and other healthcare systems.  Past medical history, social, surgical and family history all reviewed in electronic medical record.  No pertanent information unless stated regarding to the chief complaint.   Past Medical History:  Diagnosis Date   Allergic rhinitis, mild    Anemia    Depression    Hypertension    Ovarian cyst     Allergies  Allergen Reactions   Gabapentin Swelling   Quinolones Other (See Comments)   Ciprofloxacin Other (See Comments)    Hx of asc. Aortic dilitation.   Family hx of Marfans     Review of Systems:  No headache, visual changes, nausea, vomiting, diarrhea, constipation, dizziness, abdominal pain, skin rash, fevers, chills, night sweats, weight loss, swollen lymph nodes, body aches, joint swelling, chest pain, shortness of breath, mood changes. POSITIVE muscle aches  Objective  Blood pressure 130/80, pulse (!) 110, height 5' (1.524 m), weight 143 lb (64.9 kg), last menstrual period 06/28/2016, SpO2 95%.   General: No apparent distress alert and oriented x3 mood and affect normal, dressed appropriately.  HEENT: Pupils equal, extraocular movements intact  Respiratory: Patient's speak in full sentences and does not appear short of  breath  Cardiovascular: No lower extremity edema, non tender, no erythema  Patient does have significant loss of lordosis of the lumbar spine.  Noted.  Patient does have some tenderness to palpation of the paraspinal musculature.  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  Has had epidurals previously.  Will continue to work on core strength.  Patient has done well though with her weight and encouraged her to continue to do so.  Discussed with patient to continue to stay active.  No change in medication.  Follow-up again in 6 to 8 weeks    Nonallopathic problems  Decision today to treat with OMT was based on Physical Exam  After verbal consent patient was treated with HVLA, ME, FPR techniques in cervical, rib, thoracic, lumbar, and sacral  areas  Patient tolerated the procedure well with improvement in symptoms  Patient given exercises, stretches and lifestyle modifications  See medications in patient instructions if given  Patient will follow up in 4-8 weeks             Note: This dictation was prepared with Dragon dictation along with smaller phrase technology. Any transcriptional errors that result from this process are unintentional.

## 2022-11-22 ENCOUNTER — Ambulatory Visit: Payer: Managed Care, Other (non HMO) | Admitting: Family Medicine

## 2022-11-22 ENCOUNTER — Encounter: Payer: Self-pay | Admitting: Family Medicine

## 2022-11-22 VITALS — BP 130/80 | HR 110 | Ht 60.0 in | Wt 143.0 lb

## 2022-11-22 DIAGNOSIS — M9903 Segmental and somatic dysfunction of lumbar region: Secondary | ICD-10-CM | POA: Diagnosis not present

## 2022-11-22 DIAGNOSIS — M48061 Spinal stenosis, lumbar region without neurogenic claudication: Secondary | ICD-10-CM | POA: Diagnosis not present

## 2022-11-22 DIAGNOSIS — M9902 Segmental and somatic dysfunction of thoracic region: Secondary | ICD-10-CM

## 2022-11-22 DIAGNOSIS — M9901 Segmental and somatic dysfunction of cervical region: Secondary | ICD-10-CM | POA: Diagnosis not present

## 2022-11-22 DIAGNOSIS — M9904 Segmental and somatic dysfunction of sacral region: Secondary | ICD-10-CM

## 2022-11-22 DIAGNOSIS — M9908 Segmental and somatic dysfunction of rib cage: Secondary | ICD-10-CM | POA: Diagnosis not present

## 2022-11-22 NOTE — Patient Instructions (Signed)
Good to see you  [facility] was so wonderful Follow up in 4-5 weeks

## 2022-11-22 NOTE — Assessment & Plan Note (Signed)
Stenosis noted.  Has had epidurals previously.  Will continue to work on core strength.  Patient has done well though with her weight and encouraged her to continue to do so.  Discussed with patient to continue to stay active.  No change in medication.  Follow-up again in 6 to 8 weeks

## 2022-12-18 NOTE — Progress Notes (Unsigned)
Cardiology Office Note:    Date:  12/19/2022   ID:  Tamara [name], DOB 1960-07-01, MRN [id number]  PCP:  Corwin Levins, MD  [facility] Cardiologist:  Francesca Strome   [facility] Electrophysiologist:  None   Referring MD: Corwin Levins, MD   Chief Complaint  Patient presents with   Hypertension             Tamara [name] is a 62 y.o. 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  She has grade 1 diastolic dysfunction. Has very minimal aortic dilitation Mother died at age 39 of a thoracic aortic aneurims Thought to be due  Marfans syndrome  Mother had multple signs of marfans - myopia, inverted nipple  Tamara [name] also has similar characteristics  multioke family members have Marfans   Pt also has signs of marfarns , Myopia, inverted nipple,  Now finally found to have mildly dilated ascending aorta  No cp,  No dyspnea  Hx of  HTN .    Oct. 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 Was seen byDr. Sidney Ace on Sept. 9, 2021 She has remained asymptomatic.  Her aorta is normal sized. There is no indication to do broad genetic testing in [place] at this point but Dr. Jomarie Longs has recommended testing for the family variant once its identified in her younger brother .  The mild aortic dilatation seen on echo was further evaluated by CT angio Her asc. Aorta measures 36 mm by CT scan   Oct. 31, 2022; Tamara [name] is seen today for follow upf of her HTN and family hx of Marfans syndrome She has been seen by our genetic counsilor- Dr. Sidney Ace who did not recommend any additional genetic testing  Had left wrist surgery   We discussed follow up - will likely see in 2-3  years  She wants follow up of her throacic aorta - very strong family hx of aortic dilatation .    Nov. 4, 2024 Tamara [name] is seen today for follow up of her HTN , family hx of aortic aneurisms, Has been seen by Dr. Sidney Ace for genetic testing  Both brothers and her mother have aortic aneurism.      She has borderline aortic dilatation . Will get a follow up CTA of her entire aorta  Mother died at age 68 of   Needs to have right wrist surgery   Just got back from [place] NP .   Is very active       Past Medical History:  Diagnosis Date   Allergic rhinitis, mild    Anemia    Depression    Hypertension    Ovarian cyst     Past Surgical History:  Procedure Laterality Date   LAMINECTOMY     LUMBAR EPIDURAL INJECTION  11/03/2014    Current Medications: Current Meds  Medication Sig   ALPRAZolam (XANAX) 0.5 MG tablet TAKE 1 TABLET(0.5 MG) BY MOUTH THREE TIMES DAILY AS NEEDED FOR ANXIETY   Cholecalciferol (THERA-D 2000) 50 MCG (2000 UT) TABS 1 tab by mouth once daily   hydrOXYzine (ATARAX) 10 MG tablet TAKE 1 TABLET(10 MG) BY MOUTH THREE TIMES  DAILY AS NEEDED   levothyroxine (SYNTHROID) 50 MCG tablet Take 1 tablet (50 mcg total) by mouth daily.   Loratadine (CLARITIN PO) Take by mouth.   losartan (COZAAR) 100 MG tablet TAKE 1 TABLET(100 MG) BY MOUTH DAILY   Probiotic Product (ALIGN PO) Align   venlafaxine XR (EFFEXOR-XR) 75 MG 24 hr capsule Take 1 capsule (75 mg total) by mouth daily with breakfast.   vitamin B-12 (CYANOCOBALAMIN) 100 MCG tablet Vitamin B12     Allergies:   Gabapentin, Quinolones, and Ciprofloxacin   Social History   Socioeconomic History   Marital status: Married    Spouse name: Not on file   Number of children: 0   Years of education: Not on file   Highest education level: Not on file  Occupational History   Not on file  Tobacco Use   Smoking status: Never    Passive exposure: Never   Smokeless tobacco: Never  Vaping Use   Vaping  status: Never Used  Substance and Sexual Activity   Alcohol use: Yes    Alcohol/week: 3.0 standard drinks of alcohol    Types: 3 Standard drinks or equivalent per week   Drug use: No   Sexual activity: Not Currently    Partners: Male    Birth control/protection: Post-menopausal    Comment: menarche 62yo, sexual debut 62yo  Other Topics Concern   Not on file  Social History Narrative   Not on file   Social Determinants of Health   Financial Resource Strain: Not on file  Food Insecurity: Not on file  Transportation Needs: Not on file  Physical Activity: Not on file  Stress: Not on file  Social Connections: Not on file     Family History: The patient's family history includes Alcohol abuse in her father; Depression in her father and mother; Pancreatic cancer in her father.  ROS:   Please see the history of present illness.     All other systems reviewed and are negative.  EKGs/Labs/Other Studies Reviewed:    The following studies were reviewed today:    Recent Labs: 12/23/2021: ALT 25; BUN 17; Creatinine, Ser 0.84; Hemoglobin 13.9; Platelets 350.0; Potassium 4.9; Sodium 137; TSH 1.00  Recent Lipid Panel    Component Value Date/Time   CHOL 214 (H) 12/23/2021 1435   TRIG 49.0 12/23/2021 1435   HDL 119.20 12/23/2021 1435   CHOLHDL 2 12/23/2021 1435   VLDL 9.8 12/23/2021 1435   LDLCALC 85 12/23/2021 1435   LDLDIRECT 89.0 01/13/2020 1406    Physical Exam:     Physical Exam: Blood pressure 134/82, pulse 93, height 5\' 1"  (1.549 m), weight 143 lb (64.9 kg), last menstrual period 06/28/2016, SpO2 98%.       GEN:  Well nourished, well developed in no acute distress HEENT: Normal NECK: No JVD; No carotid bruits LYMPHATICS: No lymphadenopathy CARDIAC: RRR , very soft systolic murmur  RESPIRATORY:  Clear to auscultation without rales, wheezing or rhonchi  ABDOMEN: Soft, non-tender, non-distended MUSCULOSKELETAL:  No edema; No deformity  SKIN: Warm and  dry NEUROLOGIC:  Alert and oriented x 3    ECG:     EKG Interpretation Date/Time:  Monday December 19 2022 10:56:11 EST Ventricular Rate:  94 PR Interval:  164 QRS Duration:  58 QT Interval:  352 QTC Calculation: 440 R Axis:   -10  Text Interpretation: Normal sinus rhythm Low voltage QRS Cannot rule out Anterior infarct , age undetermined No previous ECGs available Confirmed by Kristeen Miss ([id number]) on  12/19/2022 11:21:57 AM      ASSESSMENT:    1. Aortic dilatation (HCC)    PLAN:     1.  Hypertension:  BP is well controlled.   2.  Family history of dilated aorta: She has multiple family members with a history of a dilated aorta.  Originally they were told it might be Marfan's.  She has met with Dr. Jomarie Longs who informed her that there is no evidence of Marfan syndrome but it could be a different aortopathy.  She has had echos suggesting borderline aortic dilatation .   Her 2 brothers have aortic aneurysms and her mother died of an aortic aneurysm rupture at age 20.   Will get a CTA of her entire aorta.  Will follow-up with her in 2 years.  Will also get a coronary calcium score    Possible Marfan syndrome:   She has been seen by Dr. Sidney Ace.    Tamara [name] has numerous family members that have Marfan's or similar conditions.  Several have had aortic ruptures and hollow organ rupture.  Tamara [name] does not have any aortic dilatation but did have mild aortic dilatation by echo.  Her aorta measures 36 mm which is at the upper limits of normal.  I think it makes sense to continue to follow her.  We will get an echocardiogram next year.  If we see any growth in the size of her ascending aorta then we can confirm/verify with a CT angiogram.  She will continue with diet, exercise, weight loss efforts.  She will continue with the current blood pressure medications.  Her brother has received some genetic testing information.  She will be sending that information along in her MyChart  email.  Medication Adjustments/Labs and Tests Ordered: Current medicines are reviewed at length with the patient today.  Concerns regarding medicines are outlined above.  Orders Placed This Encounter  Procedures   CT ANGIO CHEST AORTA W/ & OR WO/CM & GATING ([place] ONLY)   EKG 12-Lead    No orders of the defined types were placed in this encounter.     Patient Instructions  Testing/Procedures: Calcium Score CT (drawbridge) Your physician has requested that you have cardiac CT. Cardiac computed tomography (CT) is a painless test that uses an x-ray machine to take clear, detailed pictures of your heart. For further information please visit [email or web address] Please follow instruction sheet as given.  CTA of Aorta Non-Cardiac CT Angiography (CTA), is a special type of CT scan that uses a computer to produce multi-dimensional views of major blood vessels throughout the body. In CT angiography, a contrast material is injected through an IV to help visualize the blood vessels  Follow-Up: At [facility], you and your health needs are our priority.  As part of our continuing mission to provide you with exceptional heart care, we have created designated Provider Care Teams.  These Care Teams include your primary Cardiologist (physician) and Advanced Practice Providers (APPs -  Physician Assistants and Nurse Practitioners) who all work together to provide you with the care you need, when you need it.  Your next appointment:   2 year(s)  Provider:   Kristeen Miss, MD     Signed, Kristeen Miss, MD  12/19/2022 1:47 PM    [facility]

## 2022-12-19 ENCOUNTER — Ambulatory Visit: Payer: Managed Care, Other (non HMO) | Attending: Cardiovascular Disease | Admitting: Cardiovascular Disease

## 2022-12-19 ENCOUNTER — Encounter: Payer: Self-pay | Admitting: Cardiovascular Disease

## 2022-12-19 VITALS — BP 134/82 | HR 93 | Ht 61.0 in | Wt 143.0 lb

## 2022-12-19 DIAGNOSIS — I77819 Aortic ectasia, unspecified site: Secondary | ICD-10-CM | POA: Diagnosis not present

## 2022-12-19 NOTE — Patient Instructions (Signed)
Testing/Procedures: Calcium Score CT (drawbridge) Your physician has requested that you have cardiac CT. Cardiac computed tomography (CT) is a painless test that uses an x-ray machine to take clear, detailed pictures of your heart. For further information please visit [email or web address] Please follow instruction sheet as given.  CTA of Aorta Non-Cardiac CT Angiography (CTA), is a special type of CT scan that uses a computer to produce multi-dimensional views of major blood vessels throughout the body. In CT angiography, a contrast material is injected through an IV to help visualize the blood vessels  Follow-Up: At [facility], you and your health needs are our priority.  As part of our continuing mission to provide you with exceptional heart care, we have created designated Provider Care Teams.  These Care Teams include your primary Cardiologist (physician) and Advanced Practice Providers (APPs -  Physician Assistants and Nurse Practitioners) who all work together to provide you with the care you need, when you need it.  Your next appointment:   2 year(s)  Provider:   Kristeen Miss, MD

## 2022-12-21 ENCOUNTER — Other Ambulatory Visit: Payer: Self-pay

## 2022-12-21 ENCOUNTER — Encounter: Payer: Self-pay | Admitting: Family Medicine

## 2022-12-21 ENCOUNTER — Other Ambulatory Visit: Payer: Self-pay | Admitting: Family Medicine

## 2022-12-21 ENCOUNTER — Encounter: Payer: Self-pay | Admitting: Cardiovascular Disease

## 2022-12-21 MED ORDER — HYDROXYZINE HCL 10 MG PO TABS: ORAL_TABLET | ORAL | 0 refills | Status: DC

## 2022-12-27 ENCOUNTER — Other Ambulatory Visit: Payer: Self-pay | Admitting: Internal Medicine

## 2022-12-28 ENCOUNTER — Other Ambulatory Visit: Payer: Self-pay | Admitting: Family Medicine

## 2023-01-03 ENCOUNTER — Ambulatory Visit: Payer: Managed Care, Other (non HMO) | Admitting: Family Medicine

## 2023-01-08 ENCOUNTER — Encounter: Payer: Self-pay | Admitting: Internal Medicine

## 2023-01-10 ENCOUNTER — Other Ambulatory Visit: Payer: Self-pay

## 2023-01-10 MED ORDER — LEVOTHYROXINE SODIUM 50 MCG PO TABS: 50.0000 ug | ORAL_TABLET | Freq: Every day | ORAL | 3 refills | Status: DC

## 2023-01-11 ENCOUNTER — Ambulatory Visit (HOSPITAL_BASED_OUTPATIENT_CLINIC_OR_DEPARTMENT_OTHER)
Admission: RE | Admit: 2023-01-11 | Discharge: 2023-01-11 | Disposition: A | Discharge status: Home / Self Care | Payer: Managed Care, Other (non HMO) | Source: Ambulatory Visit | Attending: Cardiovascular Disease | Admitting: Cardiovascular Disease

## 2023-01-11 DIAGNOSIS — I10 Essential (primary) hypertension: Secondary | ICD-10-CM | POA: Insufficient documentation

## 2023-01-11 DIAGNOSIS — I77819 Aortic ectasia, unspecified site: Secondary | ICD-10-CM | POA: Insufficient documentation

## 2023-01-18 ENCOUNTER — Encounter: Payer: Self-pay | Admitting: Internal Medicine

## 2023-01-18 ENCOUNTER — Other Ambulatory Visit (HOSPITAL_COMMUNITY): Payer: Managed Care, Other (non HMO)

## 2023-01-18 MED ORDER — ALPRAZOLAM 0.5 MG PO TABS: ORAL_TABLET | ORAL | 2 refills | Status: DC

## 2023-01-20 ENCOUNTER — Encounter: Payer: Self-pay | Admitting: Plastic Surgery

## 2023-01-20 ENCOUNTER — Telehealth: Payer: Self-pay | Admitting: Plastic Surgery

## 2023-01-20 ENCOUNTER — Ambulatory Visit (INDEPENDENT_AMBULATORY_CARE_PROVIDER_SITE_OTHER): Payer: Self-pay | Admitting: Plastic Surgery

## 2023-01-20 DIAGNOSIS — Z719 Counseling, unspecified: Secondary | ICD-10-CM

## 2023-01-20 NOTE — Telephone Encounter (Signed)
Not 4 session, meant if patient wants to add a 4th syringe will be $800.00 for adding 4th syringe

## 2023-01-20 NOTE — Progress Notes (Signed)
Kybella Procedure Note  Procedure: Cosmetic deoxycholic acid injection (2 vials were used)  Pre-operative Diagnosis: Submental fullness  Post-operative Diagnosis: Same  Complications:  None  Brief history: The patient desires improvement in the appearance of moderate to severe convexity or fullness associated with submental fat. I discussed with the patient this proposed procedure of Kybella, which is customized depending on the particular needs of the patient. It is performed on the submental area for reduction in the fat.  The alternatives were discussed with the patient. The risks were addressed including bleeding, scarring, infection, damage to deeper structures, asymmetry, numbness, formation of areas of hardness, swelling, nodules, skin ulceration, headache, alopecia, difficulty swallowing, and muscle weakness. Additionally, marginal mandibular nerve injury could occur and is manifested as an asymmetric smile or facial muscle weakness.  The individual's choice to undergo a surgical procedure is based on the comparison of risks to potential benefits. Injections do not arrest the aging process or produce permanent tightening of the skin.  Operative intervention maybe necessary to maintain the results. The patient understands and wishes to proceed. An informed consent was signed and informational brochures given to her prior to the procedure.  Procedure: The area was prepped with alcohol and dried with a clean gauze. Using a clean technique, the submental area was palpated and pinched.  The skin at the area was pulled and there was appropriate elasticity noted without excessive skin laxity. The patient was asked to tense the platysma to define the subcutaneous fat between the dermis and platysma.  A skin marker was used to mark the anterior, posterior and lateral borders of the submental fat compartment. The "no treatment Zone" was marked. The treatment zone was injected in the subcutaneous layer with  lidocaine 1% with epinepherine. The grid was placed on the skin with the water over it for activation of the ink.  The clear protective sheet was removed leaving the markings.  The dots outside the previously marked treatment zone were removed with an alcohol wipe.  The pre-platysmal fat was pinched with 2 fingers.  Each dot was injected perpendicular to the skin with .2 cc of Kybella using a 1 ml syring and a 30 gauge needle. The ink was wiped off the skin with an alcohol wipe and a cold pack was applied to the treatment area. No complications were noted. Light pressure with an ice pack was held for 5 minutes. She was instructed explicitly in post-operative care including no massage, heavy activity, work out or facial for 24 hours.  Deoxycholic Acid 2 LOT:  43000F

## 2023-01-20 NOTE — Telephone Encounter (Signed)
Original price for Kybella package has 3 syringes in package for $2400.00  Each syringe is $800.00  Dr Ulice Bold did a 15% discount for the December Holiday Special per Dr Ulice Bold bringing the [facility] to $2040.00 for the package, each syringe to $680.00 each  Pt used 2 syringes this visit and has 1 syringe left for next session.  Dr Ulice Bold took an extra $80.00 off bringing the total package to $1960.00  Pt paid today $1960.00  Pt will not owe anything for last syringe/last session.  If pt wants to add a 4th session, pt will be charged $800.00 per syringe  I called pt today 01-20-23 to explain how Salley Hews is priced. Pt mentioned she was not numbed and wants to know if she was supposed to be numbed. Sending to clinal staff to verfiy if she was numbed. Thank you

## 2023-01-27 ENCOUNTER — Encounter: Payer: Self-pay | Admitting: Cardiovascular Disease

## 2023-01-30 ENCOUNTER — Encounter (HOSPITAL_COMMUNITY): Payer: Self-pay

## 2023-01-30 ENCOUNTER — Ambulatory Visit (HOSPITAL_COMMUNITY): Payer: Commercial Managed Care - HMO

## 2023-02-01 ENCOUNTER — Other Ambulatory Visit: Payer: Self-pay | Admitting: Internal Medicine

## 2023-02-01 NOTE — Telephone Encounter (Signed)
Copied from CRM [id number]. Topic: Clinical - Medication Refill >> Feb 01, 2023  1:46 PM Elizebeth Brooking wrote: Most Recent Primary Care Visit:  Provider: Corwin Levins  Department: [facility]  Visit Type: PHYSICAL  Date: 02/22/2022  Medication: losartan (COZAAR) 100 MG  Has the patient contacted their pharmacy? Yes, the patient stated that the pharmacy stated they have tried to contact the office (Agent: If no, request that the patient contact the pharmacy for the refill. If patient does not wish to contact the pharmacy document the reason why and proceed with request.) (Agent: If yes, when and what did the pharmacy advise?)  Is this the correct pharmacy for this prescription? Yes If no, delete pharmacy and type the correct one.  This is the patient's preferred pharmacy:  [id number] - [place], [place] - [address] AT [place] [facility] OF [address] [place] [address] Phone: [phone] Fax: [phone]  [place] [facility] [address] [place] [address] Phone: [phone] Fax: [phone]   Has the prescription been filled recently?   Is the patient out of the medication?   Has the patient been seen for an appointment in the last year OR does the patient have an upcoming appointment?   Can we respond through MyChart?   Agent: Please be advised that Rx refills may take up to 3 business days. We ask that you follow-up with your pharmacy.

## 2023-02-03 ENCOUNTER — Other Ambulatory Visit: Payer: Self-pay

## 2023-02-03 MED ORDER — LOSARTAN POTASSIUM 100 MG PO TABS: ORAL_TABLET | ORAL | 3 refills | Status: DC

## 2023-02-15 ENCOUNTER — Encounter: Payer: Self-pay | Admitting: Family Medicine

## 2023-02-21 ENCOUNTER — Encounter: Payer: Self-pay | Admitting: Plastic Surgery

## 2023-02-28 ENCOUNTER — Encounter: Payer: Commercial Managed Care - HMO | Admitting: Internal Medicine

## 2023-02-28 ENCOUNTER — Ambulatory Visit: Payer: Self-pay | Admitting: Plastic Surgery

## 2023-02-28 DIAGNOSIS — Z719 Counseling, unspecified: Secondary | ICD-10-CM

## 2023-02-28 NOTE — Progress Notes (Signed)
 The patient is a 63 year old female here for follow-up after undergoing Kybella to her chin area.  He noticed some difference on the lateral aspect but not on the medial aspect.  I think that this is subtle and good improvement.  We are going to give it another month or so before we go for trying any more Kybella.  Pictures were obtained of the patient and placed in the chart with the patient's or guardian's permission.

## 2023-03-03 NOTE — Progress Notes (Unsigned)
[name] [address] [place] [address] Phone: [phone] Subjective:   Bruce Donath, am serving as a scribe for Dr. Antoine Primas.  I'm seeing this patient by the request  of:  Corwin Levins, MD  CC: Back and neck pain follow-up  [id number]  CANDID CERNAK is a 63 y.o. female coming in with complaint of back and neck pain. OMT on 11/22/2022. Patient states that she is very stiff mostly in the lower back but also notes stiffness in shoulders. Pain seems to have become work in past 2 weeks.   Hand surgery December 6th, 2024.   Medications patient has been prescribed: hydroxyzine mobic  Taking: Yes         Reviewed prior external information including notes and imaging from previsou exam, outside providers and external EMR if available.   As well as notes that were available from care everywhere and other healthcare systems.  Past medical history, social, surgical and family history all reviewed in electronic medical record.  No pertanent information unless stated regarding to the chief complaint.   Past Medical History:  Diagnosis Date   Allergic rhinitis, mild    Anemia    Depression    Hypertension    Ovarian cyst     Allergies  Allergen Reactions   Gabapentin Swelling   Quinolones Other (See Comments)   Ciprofloxacin Other (See Comments)    Hx of asc. Aortic dilitation.   Family hx of Marfans     Review of Systems:  No headache, visual changes, nausea, vomiting, diarrhea, constipation, dizziness, abdominal pain, skin rash, fevers, chills, night sweats, weight loss, swollen lymph nodes, body aches, joint swelling, chest pain, shortness of breath, mood changes. POSITIVE muscle aches  Objective  Blood pressure 106/62, pulse 93, height 5\' 1"  (1.549 m), weight 149 lb (67.6 kg), last menstrual period 06/28/2016, SpO2 98%.   General: No apparent distress alert and oriented x3 mood and affect normal, dressed  appropriately.  HEENT: Pupils equal, extraocular movements intact  Respiratory: Patient's speak in full sentences and does not appear short of breath  Cardiovascular: No lower extremity edema, non tender, no erythema  Low back does have some loss lordosis noted.  Some tenderness to palpation in the paraspinal musculature.  Tightness with Pearlean Brownie right greater than left.  Osteopathic findings  C2 flexed rotated and side bent right C7 flexed rotated and side bent left T3 extended rotated and side bent right inhaled rib T9 extended rotated and side bent left L1 flexed rotated and side bent right Sacrum right on right       Assessment and Plan:  Upper back pain Seem to have increasing tightness in the upper back again.  Discussed which activities to do and which ones to avoid.  With patient having surgical intervention we will consider the possibility of laboratory workup again including the B12 and vitamin D which patient has had difficulty with previously.  Does respond well to osteopathic manipulation.  Follow-up again in 6 to 8 weeks.    Nonallopathic problems  Decision today to treat with OMT was based on Physical Exam  After verbal consent patient was treated with HVLA, ME, FPR techniques in cervical, rib, thoracic, lumbar, and sacral  areas  Patient tolerated the procedure well with improvement in symptoms  Patient given exercises, stretches and lifestyle modifications  See medications in patient instructions if given  Patient will follow up in 4-8 weeks     The above  documentation has been reviewed and is accurate and complete Judi Saa, DO         Note: This dictation was prepared with Dragon dictation along with smaller phrase technology. Any transcriptional errors that result from this process are unintentional.

## 2023-03-06 ENCOUNTER — Encounter: Payer: Commercial Managed Care - HMO | Admitting: Internal Medicine

## 2023-03-08 ENCOUNTER — Ambulatory Visit: Payer: Commercial Managed Care - HMO | Admitting: Family Medicine

## 2023-03-08 ENCOUNTER — Encounter: Payer: Self-pay | Admitting: Family Medicine

## 2023-03-08 VITALS — BP 106/62 | HR 93 | Ht 61.0 in | Wt 149.0 lb

## 2023-03-08 DIAGNOSIS — M9902 Segmental and somatic dysfunction of thoracic region: Secondary | ICD-10-CM

## 2023-03-08 DIAGNOSIS — M549 Dorsalgia, unspecified: Secondary | ICD-10-CM | POA: Diagnosis not present

## 2023-03-08 DIAGNOSIS — M48061 Spinal stenosis, lumbar region without neurogenic claudication: Secondary | ICD-10-CM | POA: Diagnosis not present

## 2023-03-08 DIAGNOSIS — M9903 Segmental and somatic dysfunction of lumbar region: Secondary | ICD-10-CM

## 2023-03-08 DIAGNOSIS — M9908 Segmental and somatic dysfunction of rib cage: Secondary | ICD-10-CM | POA: Diagnosis not present

## 2023-03-08 DIAGNOSIS — M9901 Segmental and somatic dysfunction of cervical region: Secondary | ICD-10-CM | POA: Diagnosis not present

## 2023-03-08 DIAGNOSIS — M9904 Segmental and somatic dysfunction of sacral region: Secondary | ICD-10-CM

## 2023-03-08 NOTE — Assessment & Plan Note (Signed)
Seem to have increasing tightness in the upper back again.  Discussed which activities to do and which ones to avoid.  With patient having surgical intervention we will consider the possibility of laboratory workup again including the B12 and vitamin D which patient has had difficulty with previously.  Does respond well to osteopathic manipulation.  Follow-up again in 6 to 8 weeks.

## 2023-03-09 ENCOUNTER — Encounter: Payer: Commercial Managed Care - HMO | Admitting: Internal Medicine

## 2023-03-22 ENCOUNTER — Ambulatory Visit (INDEPENDENT_AMBULATORY_CARE_PROVIDER_SITE_OTHER): Payer: Commercial Managed Care - HMO | Admitting: Internal Medicine

## 2023-03-22 ENCOUNTER — Encounter: Payer: Self-pay | Admitting: Internal Medicine

## 2023-03-22 VITALS — BP 124/78 | HR 76 | Temp 98.6°F | Ht 61.0 in | Wt 150.0 lb

## 2023-03-22 DIAGNOSIS — E039 Hypothyroidism, unspecified: Secondary | ICD-10-CM | POA: Diagnosis not present

## 2023-03-22 DIAGNOSIS — D485 Neoplasm of uncertain behavior of skin: Secondary | ICD-10-CM | POA: Insufficient documentation

## 2023-03-22 DIAGNOSIS — Z0000 Encounter for general adult medical examination without abnormal findings: Secondary | ICD-10-CM | POA: Insufficient documentation

## 2023-03-22 DIAGNOSIS — F418 Other specified anxiety disorders: Secondary | ICD-10-CM | POA: Diagnosis not present

## 2023-03-22 DIAGNOSIS — L0291 Cutaneous abscess, unspecified: Secondary | ICD-10-CM | POA: Insufficient documentation

## 2023-03-22 DIAGNOSIS — R739 Hyperglycemia, unspecified: Secondary | ICD-10-CM

## 2023-03-22 DIAGNOSIS — E538 Deficiency of other specified B group vitamins: Secondary | ICD-10-CM

## 2023-03-22 DIAGNOSIS — Z0001 Encounter for general adult medical examination with abnormal findings: Secondary | ICD-10-CM | POA: Insufficient documentation

## 2023-03-22 DIAGNOSIS — I10 Essential (primary) hypertension: Secondary | ICD-10-CM

## 2023-03-22 DIAGNOSIS — E559 Vitamin D deficiency, unspecified: Secondary | ICD-10-CM | POA: Diagnosis not present

## 2023-03-22 LAB — BASIC METABOLIC PANEL
BUN: 11 mg/dL (ref 6–23)
CO2: 28 meq/L (ref 19–32)
Calcium: 9.5 mg/dL (ref 8.4–10.5)
Chloride: 100 meq/L (ref 96–112)
Creatinine, Ser: 0.71 mg/dL (ref 0.40–1.20)
GFR: 92.53 mL/min (ref >60.00)
Glucose, Bld: 87 mg/dL (ref 70–99)
Potassium: 4.5 meq/L (ref 3.5–5.1)
Sodium: 137 meq/L (ref 135–145)

## 2023-03-22 LAB — VITAMIN D 25 HYDROXY (VIT D DEFICIENCY, FRACTURES): VITD: 67.89 ng/mL (ref 30.00–100.00)

## 2023-03-22 LAB — URINALYSIS, ROUTINE W REFLEX MICROSCOPIC
Bilirubin Urine: NEGATIVE
Hgb urine dipstick: NEGATIVE
Ketones, ur: NEGATIVE
Leukocytes,Ua: NEGATIVE
Nitrite: NEGATIVE
Specific Gravity, Urine: 1.015 (ref 1.000–1.030)
Total Protein, Urine: NEGATIVE
Urine Glucose: NEGATIVE
Urobilinogen, UA: 0.2 (ref 0.0–1.0)
pH: 7.5 (ref 5.0–8.0)

## 2023-03-22 LAB — CBC WITH DIFFERENTIAL/PLATELET
Basophils Absolute: 0 10*3/uL (ref 0.0–0.1)
Basophils Relative: 0.9 % (ref 0.0–3.0)
Eosinophils Absolute: 0.4 10*3/uL (ref 0.0–0.7)
Eosinophils Relative: 9 % — ABNORMAL HIGH (ref 0.0–5.0)
HCT: 42.7 % (ref 36.0–46.0)
Hemoglobin: 14.3 g/dL (ref 12.0–15.0)
Lymphocytes Relative: 29.1 % (ref 12.0–46.0)
Lymphs Abs: 1.4 10*3/uL (ref 0.7–4.0)
MCHC: 33.5 g/dL (ref 30.0–36.0)
MCV: 99.3 fL (ref 78.0–100.0)
Monocytes Absolute: 0.4 10*3/uL (ref 0.1–1.0)
Monocytes Relative: 8 % (ref 3.0–12.0)
Neutro Abs: 2.5 10*3/uL (ref 1.4–7.7)
Neutrophils Relative %: 53 % (ref 43.0–77.0)
Platelets: 336 10*3/uL (ref 150.0–400.0)
RBC: 4.3 Mil/uL (ref 3.87–5.11)
RDW: 12.1 % (ref 11.5–15.5)
WBC: 4.8 10*3/uL (ref 4.0–10.5)

## 2023-03-22 LAB — HEPATIC FUNCTION PANEL
ALT: 21 U/L (ref 0–35)
AST: 26 U/L (ref 0–37)
Albumin: 4.8 g/dL (ref 3.5–5.2)
Alkaline Phosphatase: 72 U/L (ref 39–117)
Bilirubin, Direct: 0.2 mg/dL (ref 0.0–0.3)
Total Bilirubin: 0.9 mg/dL (ref 0.2–1.2)
Total Protein: 7.1 g/dL (ref 6.0–8.3)

## 2023-03-22 LAB — HEMOGLOBIN A1C: Hgb A1c MFr Bld: 5.1 % (ref 4.6–6.5)

## 2023-03-22 LAB — LIPID PANEL
Cholesterol: 236 mg/dL — ABNORMAL HIGH (ref 0–200)
HDL: 122.7 mg/dL (ref >39.00)
LDL Cholesterol: 106 mg/dL — ABNORMAL HIGH (ref 0–99)
NonHDL: 113.62
Total CHOL/HDL Ratio: 2
Triglycerides: 39 mg/dL (ref 0.0–149.0)
VLDL: 7.8 mg/dL (ref 0.0–40.0)

## 2023-03-22 LAB — VITAMIN B12: Vitamin B-12: 908 pg/mL (ref 211–911)

## 2023-03-22 LAB — TSH: TSH: 1.54 u[IU]/mL (ref 0.35–5.50)

## 2023-03-22 MED ORDER — LOSARTAN POTASSIUM 100 MG PO TABS: ORAL_TABLET | ORAL | 3 refills | Status: AC

## 2023-03-22 MED ORDER — LEVOTHYROXINE SODIUM 50 MCG PO TABS: 50.0000 ug | ORAL_TABLET | Freq: Every day | ORAL | 3 refills | Status: AC

## 2023-03-22 MED ORDER — VENLAFAXINE HCL ER 75 MG PO CP24: 75.0000 mg | ORAL_CAPSULE | Freq: Every day | ORAL | 3 refills | Status: DC

## 2023-03-22 NOTE — Patient Instructions (Signed)
Please continue all other medications as before, and refills have been done if requested.  Please have the pharmacy call with any other refills you may need.  Please continue your efforts at being more active, low cholesterol diet, and weight control.  You are otherwise up to date with prevention measures today.  Please keep your appointments with your specialists as you may have planned  Please go to the LAB at the blood drawing area for the tests to be done  You will be contacted by phone if any changes need to be made immediately.  Otherwise, you will receive a letter about your results with an explanation, but please check with MyChart first.  Please make an Appointment to return for your 1 year visit, or sooner if needed, with Lab testing by Appointment as well, to be done about 3-5 days before at the FIRST FLOOR Lab (so this is for TWO appointments - please see the scheduling desk as you leave)

## 2023-03-22 NOTE — Progress Notes (Signed)
 Patient ID: Tamara [name], female   DOB: February 09, 1961, 62 y.o.   MRN: [id number]         Chief Complaint:: wellness exam and htn, depression anxiety, low thyroid , low b12 and vit D       HPI:  Tamara [name] is a 63 y.o. female here for wellness exam; declines covid booster, o/w up to date                        Also did have a trip and fall with filling the bird feeder but no injury.  Pt denies chest pain, increased sob or doe, wheezing, orthopnea, PND, increased LE swelling, palpitations, dizziness or syncope.   Pt denies polydipsia, polyuria, or new focal neuro s/s.    Pt denies fever, wt loss, night sweats, loss of appetite, or other constitutional symptoms  Denies worsening depressive symptoms, suicidal ideation, or panic; has ongoing anxiety.  Denies hyper or hypo thyroid  symptoms such as voice, skin or hair change.    Wt Readings from Last 3 Encounters:  03/22/23 150 lb (68 kg)  03/08/23 149 lb (67.6 kg)  12/19/22 143 lb (64.9 kg)   BP Readings from Last 3 Encounters:  03/22/23 124/78  03/08/23 106/62  12/19/22 134/82   Immunization History  Administered Date(s) Administered   Influenza Whole 11/11/2008   Influenza, Seasonal, Injecte, Preservative Fre 11/01/2022   Influenza,inj,Quad PF,6+ Mos 11/14/2014, 12/12/2016, 11/10/2017, 11/08/2018, 12/17/2019, 12/30/2021   Influenza-Unspecified 11/23/2020   Moderna SARS-COV2 Booster Vaccination 05/20/2020   PFIZER(Purple Top)SARS-COV-2 Vaccination 04/17/2019, 05/18/2019, 10/22/2019, 10/26/2020   Pfizer(Comirnaty )Fall Seasonal Vaccine 12 years and older 01/11/2022, 11/01/2022   Td 09/14/2000, 11/11/2008   Tdap 11/12/2018   Zoster Recombinant(Shingrix) 12/30/2020, 04/07/2021   Health Maintenance Due  Topic Date Due   COVID-19 Vaccine (7 - 2024-25 season) 12/27/2022      Past Medical History:  Diagnosis Date   Allergic rhinitis, mild    Anemia    Depression    Hypertension    Ovarian cyst    Past Surgical History:   Procedure Laterality Date   LAMINECTOMY     LUMBAR EPIDURAL INJECTION  11/03/2014    reports that she has never smoked. She has never been exposed to tobacco smoke. She has never used smokeless tobacco. She reports current alcohol use of about 3.0 standard drinks of alcohol per week. She reports that she does not use drugs. family history includes Alcohol abuse in her father; Depression in her father and mother; Pancreatic cancer in her father. Allergies  Allergen Reactions   Gabapentin  Swelling   Quinolones Other (See Comments)   Ciprofloxacin Other (See Comments)    Hx of asc. Aortic dilitation.   Family hx of Marfans   Current Outpatient Medications on File Prior to Visit  Medication Sig Dispense Refill   ALPRAZolam  (XANAX ) 0.5 MG tablet TAKE 1 TABLET(0.5 MG) BY MOUTH THREE TIMES DAILY AS NEEDED FOR ANXIETY 90 tablet 2   Cholecalciferol (THERA-D 2000) 50 MCG (2000 UT) TABS 1 tab by mouth once daily 90 tablet 3   hydrOXYzine  (ATARAX ) 10 MG tablet TAKE 1 TABLET(10 MG) BY MOUTH THREE TIMES DAILY AS NEEDED 90 tablet 0   Loratadine (CLARITIN PO) Take by mouth.     meloxicam  (MOBIC ) 15 MG tablet TAKE 1 TABLET(15 MG) BY MOUTH DAILY 90 tablet 0   Probiotic Product (ALIGN PO) Align     vitamin B-12 (CYANOCOBALAMIN ) 100 MCG tablet Vitamin B12     No  current facility-administered medications on file prior to visit.        ROS:  All others reviewed and negative.  Objective        PE:  BP 124/78 (BP Location: Right Arm, Patient Position: Sitting, Cuff Size: Normal)   Pulse 76   Temp 98.6 F (37 C) (Oral)   Ht 5' 1 (1.549 m)   Wt 150 lb (68 kg)   LMP 06/28/2016 (Approximate)   SpO2 98%   BMI 28.34 kg/m                 Constitutional: Pt appears in NAD               HENT: Head: NCAT.                Right Ear: External ear normal.                 Left Ear: External ear normal.                Eyes: . Pupils are equal, round, and reactive to light. Conjunctivae and EOM are normal                Nose: without d/c or deformity               Neck: Neck supple. Gross normal ROM               Cardiovascular: Normal rate and regular rhythm.                 Pulmonary/Chest: Effort normal and breath sounds without rales or wheezing.                Abd:  Soft, NT, ND, + BS, no organomegaly               Neurological: Pt is alert. At baseline orientation, motor grossly intact               Skin: Skin is warm. No rashes, no other new lesions, LE edema - none               Psychiatric: Pt behavior is normal without agitation   Micro: none  Cardiac tracings I have personally interpreted today:  none  Pertinent Radiological findings (summarize): none   Lab Results  Component Value Date   WBC 4.8 03/22/2023   HGB 14.3 03/22/2023   HCT 42.7 03/22/2023   PLT 336.0 03/22/2023   GLUCOSE 87 03/22/2023   CHOL 236 (H) 03/22/2023   TRIG 39.0 03/22/2023   HDL 122.70 03/22/2023   LDLDIRECT 89.0 01/13/2020   LDLCALC 106 (H) 03/22/2023   ALT 21 03/22/2023   AST 26 03/22/2023   NA 137 03/22/2023   K 4.5 03/22/2023   CL 100 03/22/2023   CREATININE 0.71 03/22/2023   BUN 11 03/22/2023   CO2 28 03/22/2023   TSH 1.54 03/22/2023   HGBA1C 5.1 03/22/2023   Assessment/Plan:  Tamara [name] is a 63 y.o. White or Caucasian [1] female with  has a past medical history of Allergic rhinitis, mild, Anemia, Depression, Hypertension, and Ovarian cyst.  Preventative health care Age and sex appropriate education and counseling updated with regular exercise and diet Referrals for preventative services - none needed Immunizations addressed - declines covid booster Smoking counseling  - none needed Evidence for depression or other mood disorder - none significant Most recent labs reviewed. I have personally reviewed and have noted: 1) the patient's  medical and social history 2) The patient's current medications and supplements 3) The patient's height, weight, and BMI have been recorded in  the chart   Depression with anxiety Stable, continue current med tx  Essential hypertension BP Readings from Last 3 Encounters:  03/22/23 124/78  03/08/23 106/62  12/19/22 134/82   Stable, pt to continue medical treatment losartan  100 qd   Hypothyroidism Lab Results  Component Value Date   TSH 1.54 03/22/2023   Stable, pt to continue levothyroxine  50 mcg qd   Vitamin D  deficiency Last vitamin D  Lab Results  Component Value Date   VD25OH 67.89 03/22/2023   Stable, cont oral replacement   B12 deficiency Lab Results  Component Value Date   VITAMINB12 908 03/22/2023   Stable, cont oral replacement - b12 1000 mcg qd  Followup: Return in about 1 year (around 03/21/2024).  Lynwood Rush, MD 03/26/2023 2:48 PM [facility]

## 2023-03-24 ENCOUNTER — Encounter: Payer: Commercial Managed Care - HMO | Admitting: Plastic Surgery

## 2023-03-26 ENCOUNTER — Encounter: Payer: Self-pay | Admitting: Internal Medicine

## 2023-03-26 DIAGNOSIS — E559 Vitamin D deficiency, unspecified: Secondary | ICD-10-CM | POA: Insufficient documentation

## 2023-03-26 DIAGNOSIS — E538 Deficiency of other specified B group vitamins: Secondary | ICD-10-CM | POA: Insufficient documentation

## 2023-03-26 DIAGNOSIS — E039 Hypothyroidism, unspecified: Secondary | ICD-10-CM | POA: Insufficient documentation

## 2023-03-26 NOTE — Assessment & Plan Note (Signed)
Stable, continue current med tx ?

## 2023-03-26 NOTE — Assessment & Plan Note (Signed)
 Last vitamin D  Lab Results  Component Value Date   VD25OH 67.89 03/22/2023   Stable, cont oral replacement

## 2023-03-26 NOTE — Assessment & Plan Note (Signed)

## 2023-03-26 NOTE — Assessment & Plan Note (Signed)
 BP Readings from Last 3 Encounters:  03/22/23 124/78  03/08/23 106/62  12/19/22 134/82   Stable, pt to continue medical treatment losartan  100 qd

## 2023-03-26 NOTE — Assessment & Plan Note (Signed)
 Lab Results  Component Value Date   VITAMINB12 908 03/22/2023   Stable, cont oral replacement - b12 1000 mcg qd

## 2023-03-26 NOTE — Assessment & Plan Note (Signed)
 Lab Results  Component Value Date   TSH 1.54 03/22/2023   Stable, pt to continue levothyroxine  50 mcg qd

## 2023-04-02 ENCOUNTER — Other Ambulatory Visit: Payer: Self-pay | Admitting: Family Medicine

## 2023-04-03 ENCOUNTER — Other Ambulatory Visit: Payer: Self-pay | Admitting: Family Medicine

## 2023-04-04 ENCOUNTER — Encounter: Payer: Self-pay | Admitting: Family Medicine

## 2023-04-04 MED ORDER — HYDROXYZINE HCL 10 MG PO TABS: ORAL_TABLET | ORAL | 0 refills | Status: DC

## 2023-04-10 NOTE — Progress Notes (Unsigned)
  [name] [address] [place] [address] Phone: [phone] Subjective:   Tamara [name], am serving as a scribe for Dr. Antoine Primas.  I'm seeing this patient by the request  of:  Corwin Levins, MD  CC: Back and neck pain follow-up  [id number]  Tamara [name] is a 63 y.o. female coming in with complaint of back and neck pain. OMT on 03/08/2023. Patient states that after last visit she fell on the medial aspect of R knee. Pain still present with squatting, walking or palpation.   Also c/o pain and swelling in L 2nd metacarpal. Using voltaren gel. Feels that there is a loss of ROM and strength.   Medications patient has been prescribed: Atarax  Taking:  Patient did have a question about the MMR vaccine.       Reviewed prior external information including notes and imaging from previsou exam, outside providers and external EMR if available.   As well as notes that were available from care everywhere and other healthcare systems.  Past medical history, social, surgical and family history all reviewed in electronic medical record.  No pertanent information unless stated regarding to the chief complaint.   Past Medical History:  Diagnosis Date   Allergic rhinitis, mild    Anemia    Depression    Hypertension    Ovarian cyst     Allergies  Allergen Reactions   Gabapentin Swelling   Quinolones Other (See Comments)   Ciprofloxacin Other (See Comments)    Hx of asc. Aortic dilitation.   Family hx of Marfans     Review of Systems:  No headache, visual changes, nausea, vomiting, diarrhea, constipation, dizziness, abdominal pain, skin rash, fevers, chills, night sweats, weight loss, swollen lymph nodes, body aches, joint swelling, chest pain, shortness of breath, mood changes. POSITIVE muscle aches  Objective  Last menstrual period 06/28/2016.   General: No apparent distress alert and oriented x3 mood and affect normal,  dressed appropriately.  HEENT: Pupils equal, extraocular movements intact  Respiratory: Patient's speak in full sentences and does not appear short of breath  Cardiovascular: No lower extremity edema, non tender, no erythema  Gait MSK:  Back   Osteopathic findings  C2 flexed rotated and side bent right C6 flexed rotated and side bent left T3 extended rotated and side bent right inhaled rib T9 extended rotated and side bent left L2 flexed rotated and side bent right Sacrum right on right       Assessment and Plan:  No problem-specific Assessment & Plan notes found for this encounter.    Nonallopathic problems  Decision today to treat with OMT was based on Physical Exam  After verbal consent patient was treated with HVLA, ME, FPR techniques in cervical, rib, thoracic, lumbar, and sacral  areas  Patient tolerated the procedure well with improvement in symptoms  Patient given exercises, stretches and lifestyle modifications  See medications in patient instructions if given  Patient will follow up in 4-8 weeks      The above documentation has been reviewed and is accurate and complete Judi Saa, DO        Note: This dictation was prepared with Dragon dictation along with smaller phrase technology. Any transcriptional errors that result from this process are unintentional.

## 2023-04-12 ENCOUNTER — Ambulatory Visit: Payer: Commercial Managed Care - HMO | Admitting: Family Medicine

## 2023-04-12 VITALS — BP 128/92 | HR 110 | Ht 61.0 in | Wt 150.0 lb

## 2023-04-12 DIAGNOSIS — M9903 Segmental and somatic dysfunction of lumbar region: Secondary | ICD-10-CM

## 2023-04-12 DIAGNOSIS — M9901 Segmental and somatic dysfunction of cervical region: Secondary | ICD-10-CM | POA: Diagnosis not present

## 2023-04-12 DIAGNOSIS — M48061 Spinal stenosis, lumbar region without neurogenic claudication: Secondary | ICD-10-CM

## 2023-04-12 DIAGNOSIS — M9902 Segmental and somatic dysfunction of thoracic region: Secondary | ICD-10-CM | POA: Diagnosis not present

## 2023-04-12 DIAGNOSIS — M19049 Primary osteoarthritis, unspecified hand: Secondary | ICD-10-CM | POA: Diagnosis not present

## 2023-04-12 DIAGNOSIS — M9908 Segmental and somatic dysfunction of rib cage: Secondary | ICD-10-CM | POA: Diagnosis not present

## 2023-04-12 DIAGNOSIS — M9904 Segmental and somatic dysfunction of sacral region: Secondary | ICD-10-CM

## 2023-04-12 NOTE — Patient Instructions (Signed)
 Frog splint at night  Coban during the day See me again in 6-8 weeks

## 2023-04-13 ENCOUNTER — Encounter: Payer: Self-pay | Admitting: Family Medicine

## 2023-04-13 DIAGNOSIS — M19049 Primary osteoarthritis, unspecified hand: Secondary | ICD-10-CM | POA: Insufficient documentation

## 2023-04-13 NOTE — Assessment & Plan Note (Signed)
 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  Given brace to try to wear at night and buddy taping during the day.  Worsening pain will need to consider injections.

## 2023-04-13 NOTE — Assessment & Plan Note (Signed)
 Continue to monitor.  Discussed icing regimen at home exercises, discussed which activities to do and which ones to avoid.  Increase activity slowly over the course of next several weeks.  Discussed icing regimen.  Does respond well to osteopathic manipulation.  Follow-up again in 6 to 8 weeks otherwise.

## 2023-04-18 ENCOUNTER — Ambulatory Visit (INDEPENDENT_AMBULATORY_CARE_PROVIDER_SITE_OTHER): Payer: Self-pay | Admitting: Plastic Surgery

## 2023-04-18 ENCOUNTER — Encounter: Payer: Self-pay | Admitting: Plastic Surgery

## 2023-04-18 DIAGNOSIS — Z719 Counseling, unspecified: Secondary | ICD-10-CM

## 2023-04-18 NOTE — Progress Notes (Signed)
 Kybella Procedure Note  Procedure: Cosmetic deoxycholic acid injection (1 vials were used)  Pre-operative Diagnosis: Submental fullness  Post-operative Diagnosis: Same  Complications:  None  Brief history: The patient desires improvement in the appearance of moderate to severe convexity or fullness associated with submental fat. I discussed with the patient this proposed procedure of Kybella, which is customized depending on the particular needs of the patient. It is performed on the submental area for reduction in the fat.  The alternatives were discussed with the patient. The risks were addressed including bleeding, scarring, infection, damage to deeper structures, asymmetry, numbness, formation of areas of hardness, swelling, nodules, skin ulceration, headache, alopecia, difficulty swallowing, and muscle weakness. Additionally, marginal mandibular nerve injury could occur and is manifested as an asymmetric smile or facial muscle weakness.  The individual's choice to undergo a surgical procedure is based on the comparison of risks to potential benefits. Injections do not arrest the aging process or produce permanent tightening of the skin.  Operative intervention maybe necessary to maintain the results. The patient understands and wishes to proceed. An informed consent was signed and informational brochures given to her prior to the procedure.  Procedure: The area was prepped with alcohol and dried with a clean gauze. Using a clean technique, the submental area was palpated and pinched.  The skin at the area was pulled and there was appropriate elasticity noted without excessive skin laxity. The patient was asked to tense the platysma to define the subcutaneous fat between the dermis and platysma.  A skin marker was used to mark the anterior, posterior and lateral borders of the submental fat compartment. The "no treatment Zone" was marked. The treatment zone was injected in the subcutaneous layer with  lidocaine 1% with epinepherine. The grid was placed on the skin with the water over it for activation of the ink.  The clear protective sheet was removed leaving the markings.  The dots outside the previously marked treatment zone were removed with an alcohol wipe.  The pre-platysmal fat was pinched with 2 fingers.  Each dot was injected perpendicular to the skin with .2 cc of Kybella using a 1 ml syring and a 30 gauge needle. The ink was wiped off the skin with an alcohol wipe and a cold pack was applied to the treatment area. No complications were noted. Light pressure with an ice pack was held for 5 minutes. She was instructed explicitly in post-operative care including no massage, heavy activity, work out or facial for 24 hours.  Deoxycholic Acid LOT: [id number]

## 2023-05-03 ENCOUNTER — Other Ambulatory Visit: Payer: Self-pay | Admitting: Family Medicine

## 2023-05-03 ENCOUNTER — Other Ambulatory Visit: Payer: Self-pay

## 2023-05-03 DIAGNOSIS — M2550 Pain in unspecified joint: Secondary | ICD-10-CM

## 2023-05-03 MED ORDER — MELOXICAM 15 MG PO TABS: ORAL_TABLET | ORAL | 0 refills | Status: DC

## 2023-05-04 ENCOUNTER — Other Ambulatory Visit

## 2023-05-04 DIAGNOSIS — M2550 Pain in unspecified joint: Secondary | ICD-10-CM

## 2023-05-05 LAB — MEASLES/MUMPS/RUBELLA IMMUNITY
Mumps IgG: 279 [AU]/ml
Rubella: 2.31 {index}
Rubeola IgG: 16.3 [AU]/ml — ABNORMAL LOW

## 2023-05-06 ENCOUNTER — Encounter: Payer: Self-pay | Admitting: Family Medicine

## 2023-05-15 NOTE — Progress Notes (Unsigned)
  [name] [address] [place] [address] Phone: [phone] Subjective:   Tamara [name], am serving as a scribe for Dr. Antoine Primas.  I'm seeing this patient by the request  of:  Corwin Levins, MD  CC: Back and neck pain follow-up  [id number]  [name] is a 63 y.o. female coming in with complaint of back and neck pain. OMT On 04/12/2023. Patient states   Medications patient has been prescribed: hydroxyzine mobic  Taking:         Reviewed prior external information including notes and imaging from previsou exam, outside providers and external EMR if available.   As well as notes that were available from care everywhere and other healthcare systems.  Past medical history, social, surgical and family history all reviewed in electronic medical record.  No pertanent information unless stated regarding to the chief complaint.   Past Medical History:  Diagnosis Date   Allergic rhinitis, mild    Anemia    Depression    Hypertension    Ovarian cyst     Allergies  Allergen Reactions   Gabapentin Swelling   Quinolones Other (See Comments)   Ciprofloxacin Other (See Comments)    Hx of asc. Aortic dilitation.   Family hx of Marfans     Review of Systems:  No headache, visual changes, nausea, vomiting, diarrhea, constipation, dizziness, abdominal pain, skin rash, fevers, chills, night sweats, weight loss, swollen lymph nodes, body aches, joint swelling, chest pain, shortness of breath, mood changes. POSITIVE muscle aches  Objective  Blood pressure 110/78, pulse (!) 112, height 5\' 1"  (1.549 m), weight 158 lb (71.7 kg), last menstrual period 06/28/2016, SpO2 98%.   General: No apparent distress alert and oriented x3 mood and affect normal, dressed appropriately.  HEENT: Pupils equal, extraocular movements intact  Respiratory: Patient's speak in full sentences and does not appear short of breath  Cardiovascular: No  lower extremity edema, non tender, no erythema  Gait relatively normal MSK:  Back does have some loss lordosis noted.  Some tenderness to palpation in the paraspinal musculature.  Tightness noted in the back especially with sidebending.  Osteopathic findings  C2 flexed rotated and side bent right C6 flexed rotated and side bent left T3 extended rotated and side bent right inhaled rib T9 extended rotated and side bent left L2 flexed rotated and side bent right L4 flexed rotated and side bent left Sacrum right on right       Assessment and Plan:  No problem-specific Assessment & Plan notes found for this encounter.    Nonallopathic problems  Decision today to treat with OMT was based on Physical Exam  After verbal consent patient was treated with HVLA, ME, FPR techniques in cervical, rib, thoracic, lumbar, and sacral  areas  Patient tolerated the procedure well with improvement in symptoms  Patient given exercises, stretches and lifestyle modifications  See medications in patient instructions if given  Patient will follow up in 4-8 weeks    The above documentation has been reviewed and is accurate and complete Judi Saa, DO          Note: This dictation was prepared with Dragon dictation along with smaller phrase technology. Any transcriptional errors that result from this process are unintentional.

## 2023-05-22 ENCOUNTER — Ambulatory Visit: Payer: Commercial Managed Care - HMO | Admitting: Family Medicine

## 2023-05-22 VITALS — BP 110/78 | HR 112 | Ht 61.0 in | Wt 158.0 lb

## 2023-05-22 DIAGNOSIS — M9903 Segmental and somatic dysfunction of lumbar region: Secondary | ICD-10-CM

## 2023-05-22 DIAGNOSIS — M9901 Segmental and somatic dysfunction of cervical region: Secondary | ICD-10-CM | POA: Diagnosis not present

## 2023-05-22 DIAGNOSIS — M9908 Segmental and somatic dysfunction of rib cage: Secondary | ICD-10-CM | POA: Diagnosis not present

## 2023-05-22 DIAGNOSIS — M9904 Segmental and somatic dysfunction of sacral region: Secondary | ICD-10-CM

## 2023-05-22 DIAGNOSIS — M549 Dorsalgia, unspecified: Secondary | ICD-10-CM

## 2023-05-22 DIAGNOSIS — M9902 Segmental and somatic dysfunction of thoracic region: Secondary | ICD-10-CM | POA: Diagnosis not present

## 2023-05-22 NOTE — Patient Instructions (Signed)
 Flonase daily for next 2 weeks Dont squirt to the middle  Good luck with kittens See me in 2-8 weeks

## 2023-05-23 ENCOUNTER — Encounter: Payer: Self-pay | Admitting: Family Medicine

## 2023-05-23 NOTE — Assessment & Plan Note (Signed)
 Known arthritic changes noted multiple several areas in patient's neck and back.  Has been doing relatively well with conservative therapy.  Discussed icing regimen and home exercises, discussed which activities to do and which ones to avoid.  Increasing activity slowly over the course the next several days.  Follow-up again in 6 to 8 weeks

## 2023-06-18 ENCOUNTER — Encounter: Payer: Self-pay | Admitting: Family Medicine

## 2023-06-30 ENCOUNTER — Encounter: Payer: Self-pay | Admitting: Family Medicine

## 2023-06-30 ENCOUNTER — Other Ambulatory Visit: Payer: Self-pay

## 2023-06-30 MED ORDER — HYDROXYZINE HCL 10 MG PO TABS: ORAL_TABLET | ORAL | 0 refills | Status: DC

## 2023-07-06 NOTE — Progress Notes (Unsigned)
 [name] [address] [place] [address] Phone: [phone] Subjective:   Tamara [name], am serving as a scribe for Dr. Ronnell [name].  I'm seeing this patient by the request  of:  Tamara Coombe, MD  CC: Multiple complaints  Tamara [name]  Tamara [name] is a 63 y.o. female coming in with complaint of back and neck pain. OMT on 05/22/2023. Patient states doing well. Getting a new mattress soon, so hoping that helps with back and neck pain.  Medications patient has been prescribed: hydroxyzine   Taking:         Reviewed prior external information including notes and imaging from previsou exam, outside providers and external EMR if available.   As well as notes that were available from care everywhere and other healthcare systems.  Past medical history, social, surgical and family history all reviewed in electronic medical record.  No pertanent information unless stated regarding to the chief complaint.   Past Medical History:  Diagnosis Date   Allergic rhinitis, mild    Anemia    Depression    Hypertension    Ovarian cyst     Allergies  Allergen Reactions   Gabapentin  Swelling   Quinolones Other (See Comments)   Ciprofloxacin Other (See Comments)    Hx of asc. Aortic dilitation.   Family hx of Marfans     Review of Systems:  No headache, visual changes, nausea, vomiting, diarrhea, constipation, dizziness, abdominal pain, skin rash, fevers, chills, night sweats, weight loss, swollen lymph nodes, body aches, joint swelling, chest pain, shortness of breath, mood changes. POSITIVE muscle aches  Objective  Blood pressure 126/86, pulse 96, height 5\' 1"  (1.549 m), weight 148 lb (67.1 kg), last menstrual period 06/28/2016, SpO2 97%.   General: No apparent distress alert and oriented x3 mood and affect normal, dressed appropriately.  HEENT: Pupils equal, extraocular movements intact  Respiratory: Patient's speak in full  sentences and does not appear short of breath  Cardiovascular: No lower extremity edema, non tender, no erythema  Gait MSK:  Back significant loss of lordosis noted.  Tightness noted in the paraspinal musculature.  Tightness noted in the lumbosacral area.  Tightness with Tamara [name] right greater than left.  Osteopathic findings  C2 flexed rotated and side bent right C6 flexed rotated and side bent left T3 extended rotated and side bent right inhaled rib T9 extended rotated and side bent left L2 flexed rotated and side bent right L5 flexed rotated and side bent left Sacrum right on right     Assessment and Plan:  Stenosis of lateral recess of lumbar spine Significant arthritic changes noted again and patient has some tightness noted.  Has been sleeping on a very poor bed that could be potentially contributing to some of the discomfort and pain.  Discussed icing regimen of home exercises, which activities to do and which ones to avoid.  Increase activity slowly.  Follow-up again in 6 to 8 weeks.  Toradol and Depo-Medrol  injections given today.  Secondary to this worsening discomfort and pain.    Nonallopathic problems  Decision today to treat with OMT was based on Physical Exam  After verbal consent patient was treated with HVLA, ME, FPR techniques in cervical, rib, thoracic, lumbar, and sacral  areas avoided HVLA on the cervical  Patient tolerated the procedure well with improvement in symptoms  Patient given exercises, stretches and lifestyle modifications  See medications in patient instructions if given  Patient will follow up in  4-8 weeks    The above documentation has been reviewed and is accurate and complete Tamara Margo, DO          Note: This dictation was prepared with Dragon dictation along with smaller phrase technology. Any transcriptional errors that result from this process are unintentional.

## 2023-07-07 ENCOUNTER — Encounter: Payer: Self-pay | Admitting: Plastic Surgery

## 2023-07-10 ENCOUNTER — Encounter: Payer: Self-pay | Admitting: Internal Medicine

## 2023-07-11 ENCOUNTER — Ambulatory Visit: Admitting: Family Medicine

## 2023-07-11 ENCOUNTER — Encounter: Payer: Self-pay | Admitting: Family Medicine

## 2023-07-11 VITALS — BP 126/86 | HR 96 | Ht 61.0 in | Wt 148.0 lb

## 2023-07-11 DIAGNOSIS — M549 Dorsalgia, unspecified: Secondary | ICD-10-CM

## 2023-07-11 DIAGNOSIS — M9903 Segmental and somatic dysfunction of lumbar region: Secondary | ICD-10-CM

## 2023-07-11 DIAGNOSIS — M9904 Segmental and somatic dysfunction of sacral region: Secondary | ICD-10-CM | POA: Diagnosis not present

## 2023-07-11 DIAGNOSIS — M48061 Spinal stenosis, lumbar region without neurogenic claudication: Secondary | ICD-10-CM

## 2023-07-11 DIAGNOSIS — M9901 Segmental and somatic dysfunction of cervical region: Secondary | ICD-10-CM

## 2023-07-11 DIAGNOSIS — M9908 Segmental and somatic dysfunction of rib cage: Secondary | ICD-10-CM | POA: Diagnosis not present

## 2023-07-11 DIAGNOSIS — M9902 Segmental and somatic dysfunction of thoracic region: Secondary | ICD-10-CM

## 2023-07-11 MED ORDER — METHYLPREDNISOLONE ACETATE 80 MG/ML IJ SUSP
80.0000 mg | Freq: Once | INTRAMUSCULAR | Status: AC
  Administered 2023-07-11: 80 mg via INTRAMUSCULAR

## 2023-07-11 MED ORDER — KETOROLAC TROMETHAMINE 30 MG/ML IJ SOLN
60.0000 mg | Freq: Once | INTRAMUSCULAR | Status: AC
  Administered 2023-07-11: 60 mg via INTRAMUSCULAR

## 2023-07-11 MED ORDER — ALPRAZOLAM 0.5 MG PO TABS: ORAL_TABLET | ORAL | 2 refills | Status: DC

## 2023-07-11 MED ORDER — MELOXICAM 15 MG PO TABS: 15.0000 mg | ORAL_TABLET | Freq: Every day | ORAL | 0 refills | Status: DC

## 2023-07-11 NOTE — Patient Instructions (Addendum)
 See me again in 6-8 weeks Meloxicam  take in 5 day bursts Injections in backside

## 2023-07-11 NOTE — Assessment & Plan Note (Signed)
 Significant arthritic changes noted again and patient has some tightness noted.  Has been sleeping on a very poor bed that could be potentially contributing to some of the discomfort and pain.  Discussed icing regimen of home exercises, which activities to do and which ones to avoid.  Increase activity slowly.  Follow-up again in 6 to 8 weeks.  Toradol and Depo-Medrol  injections given today.  Secondary to this worsening discomfort and pain.

## 2023-07-12 ENCOUNTER — Encounter: Payer: Self-pay | Admitting: Family Medicine

## 2023-07-28 ENCOUNTER — Other Ambulatory Visit: Payer: Self-pay | Admitting: Family Medicine

## 2023-07-31 ENCOUNTER — Other Ambulatory Visit: Payer: Self-pay

## 2023-07-31 ENCOUNTER — Encounter: Payer: Self-pay | Admitting: Internal Medicine

## 2023-07-31 ENCOUNTER — Encounter: Payer: Self-pay | Admitting: Family Medicine

## 2023-07-31 MED ORDER — MELOXICAM 15 MG PO TABS: 15.0000 mg | ORAL_TABLET | Freq: Every day | ORAL | 0 refills | Status: DC

## 2023-08-01 ENCOUNTER — Other Ambulatory Visit: Payer: Self-pay | Admitting: Family Medicine

## 2023-08-04 NOTE — Telephone Encounter (Signed)
 Refill already sent in by another provider.

## 2023-08-17 ENCOUNTER — Encounter: Payer: Self-pay | Admitting: Family Medicine

## 2023-08-17 ENCOUNTER — Ambulatory Visit: Admitting: Family Medicine

## 2023-08-17 VITALS — BP 104/82 | HR 102 | Ht 61.0 in | Wt 148.0 lb

## 2023-08-17 DIAGNOSIS — E559 Vitamin D deficiency, unspecified: Secondary | ICD-10-CM

## 2023-08-17 DIAGNOSIS — G8929 Other chronic pain: Secondary | ICD-10-CM | POA: Diagnosis not present

## 2023-08-17 DIAGNOSIS — M545 Low back pain, unspecified: Secondary | ICD-10-CM

## 2023-08-17 MED ORDER — METHYLPREDNISOLONE ACETATE 80 MG/ML IJ SUSP
80.0000 mg | Freq: Once | INTRAMUSCULAR | Status: AC
  Administered 2023-08-17: 80 mg via INTRAMUSCULAR

## 2023-08-17 MED ORDER — KETOROLAC TROMETHAMINE 60 MG/2ML IM SOLN
60.0000 mg | Freq: Once | INTRAMUSCULAR | Status: AC
  Administered 2023-08-17: 60 mg via INTRAMUSCULAR

## 2023-08-17 NOTE — Progress Notes (Signed)
   Tamara Ileana Collet, [name], LAT, [name] acting as a scribe for Artist Lloyd, MD.  Tamara [name] is a 63 y.o. female who presents to [facility] today for exacerbation of her back pain. Pt was last seen by Dr. Claudene on 07/11/23 for OMT and was also given a Toradol /methylprednisolone  IM injection  Today, pt reports low back pain worsened this morning. Just woke up w/ intense pain. She does note she did some gardening yesterday and dragged 1 heavy item, but her back wasn't bothering her last night. Pt locates pain to across both sides of her low back.   She notes hx of lumbar laminectomy  Radiating pain: no LE numbness/tingling: no LE weakness: no   Aggravates: too acute Treatments tried: meloxicam , heat  Dx imaging: 03/19/20 L-spine XR  Additionally she has questions about how much vitamin D  she should take.  Vitamin D  was checked back in February it was great in the 60s.  She cannot quite member how much she was taking then.  Pertinent review of systems: No fevers or chills  Relevant historical information: Hypertension.  History of lumbar laminectomy.  Thumb surgery  Social determinants of health: Patient notes that she has not very good health insurance and physical therapy is unaffordable.  Exam:  BP 104/82   Pulse (!) 102   Ht 5' 1 (1.549 m)   Wt 148 lb (67.1 kg)   LMP 06/28/2016 (Approximate)   SpO2 98%   BMI 27.96 kg/m  General: Well Developed, well nourished, and in no acute distress.   MSK: L-spine: Normal appearing Nontender to palpation spinal midline.  Tender palpation paraspinal musculature. Decreased lumbar motion.      Assessment and Plan: 63 y.o. female with acute exacerbation of chronic low back pain.  Pain today due to muscle spasm and dysfunction.  We did talk about treatment options.  Will provide a Toradol  and Depo-Medrol  IM injection prior to discharge.  She has had this before and it helped quite a bit.  Recommended home  exercise and core strengthening.  Ideally I would like to proceed to physical therapy.  Physical therapy is very expensive for her so we can use that in the future if needed even with a phone call.  Also we talked about an x-ray.  Her last lumbar spine x-ray was from 2022 showing arthritis changes.  Reasonable to consider an x-ray especially in the future if not improving.  As for vitamin D  recommend taking 1000 to 5000 units daily.  If she can recall what she was taking in February we know that amount of vitamin D  produced excellent vitamin D  labs and would be reasonable to resume.   PDMP not reviewed this encounter. No orders of the defined types were placed in this encounter.  Meds ordered this encounter  Medications   ketorolac  (TORADOL ) injection 60 mg   methylPREDNISolone  acetate (DEPO-MEDROL ) injection 80 mg     Discussed warning signs or symptoms. Please see discharge instructions. Patient expresses understanding.   The above documentation has been reviewed and is accurate and complete Artist Lloyd, M.D.

## 2023-08-17 NOTE — Patient Instructions (Addendum)
 Thank you for coming in today.   You received an injection today. Seek immediate medical attention if the area becomes red, extremely painful, or is oozing fluid.   Work on core strengthening home exercises  Let me know if you would like physical therapy or a back xray  Depo-Medrol  and Ketorolac , intra-muscular, injections done today.

## 2023-08-27 ENCOUNTER — Other Ambulatory Visit: Payer: Self-pay | Admitting: Family Medicine

## 2023-08-29 NOTE — Progress Notes (Signed)
 [name] [address] [place] [address] Phone: [phone] Subjective:   Tamara [name], am serving as a scribe for Dr. Arthea Claudene.  I'm seeing this patient by the request  of:  Norleen Lynwood ORN, MD  CC: Neck and back pain follow-up  YEP:Dlagzrupcz  Tamara [name] is a 63 y.o. female coming in with complaint of back and neck pain. OMT on 07/11/2023. Patient states that she saw Dr. Joane on 7/3 for a cocktail injection for lower back pain. Believes that she did something to it while gardening. Pain has dissipated.   Medications patient has been prescribed: hydroxyzine  effexor  mobic   Taking:         Reviewed prior external information including notes and imaging from previsou exam, outside providers and external EMR if available.   As well as notes that were available from care everywhere and other healthcare systems.  Past medical history, social, surgical and family history all reviewed in electronic medical record.  No pertanent information unless stated regarding to the chief complaint.   Past Medical History:  Diagnosis Date   Allergic rhinitis, mild    Anemia    Depression    Hypertension    Ovarian cyst     Allergies  Allergen Reactions   Gabapentin  Swelling   Quinolones Other (See Comments)   Ciprofloxacin Other (See Comments)    Hx of asc. Aortic dilitation.   Family hx of Marfans     Review of Systems:  No headache, visual changes, nausea, vomiting, diarrhea, constipation, dizziness, abdominal pain, skin rash, fevers, chills, night sweats, weight loss, swollen lymph nodes, body aches, joint swelling, chest pain, shortness of breath, mood changes. POSITIVE muscle aches  Objective  Blood pressure (!) 122/94, pulse 97, height 5' 1 (1.549 m), weight 142 lb (64.4 kg), last menstrual period 06/28/2016, SpO2 98%.   General: No apparent distress alert and oriented x3 mood and affect normal, dressed appropriately.   HEENT: Pupils equal, extraocular movements intact  Respiratory: Patient's speak in full sentences and does not appear short of breath  Cardiovascular: No lower extremity edema, non tender, no erythema  Gait MSK:  Back does have some loss of lordosis noted.  Some tightness noted in the paraspinal musculature.  Tightness in the neck noted in sidebending bilaterally.  Osteopathic findings  C3 flexed rotated and side bent right C5 flexed rotated and side bent left T3 extended rotated and side bent right inhaled rib T9 extended rotated and side bent left L3 flexed rotated and side bent right L5 flexed rotated and side bent left Sacrum right on right       Assessment and Plan:  Stenosis of lateral recess of lumbar spine Continue stenosis noted.  Will need to consider the possibility of further imaging if worsening pain.  Patient has done well with conservative therapy as well as watching her weight overall.  Expecting patient to continue to do well hopefully.  Discussed icing regimen and home exercises.  Increase activity slowly.  Follow-up again in 6 to 8 weeks.  Aortic dilatation [facility]) Patient is due to have another echocardiogram.  Patient is going to reach out to cardiologist.  Asymptomatic at this time.  Family history of pancreatic cancer Father has had pancreatic cancer and will get labs to further evaluate.  Hopeful and we will having normal labs.  Having some reflux and has had some different stool recently.    Nonallopathic problems  Decision today to treat with OMT  was based on Physical Exam  After verbal consent patient was treated with HVLA, ME, FPR techniques in cervical, rib, thoracic, lumbar, and sacral  areas avoided HVLA on the cervical region.  Patient tolerated the procedure well with improvement in symptoms  Patient given exercises, stretches and lifestyle modifications  See medications in patient instructions if given  Patient will follow up in 4-8  weeks     The above documentation has been reviewed and is accurate and complete Tamara Louk M Roberto Hlavaty, DO         Note: This dictation was prepared with Dragon dictation along with smaller phrase technology. Any transcriptional errors that result from this process are unintentional.

## 2023-08-30 ENCOUNTER — Encounter: Payer: Self-pay | Admitting: Family Medicine

## 2023-09-05 ENCOUNTER — Ambulatory Visit: Payer: Self-pay | Admitting: Family Medicine

## 2023-09-05 ENCOUNTER — Encounter: Payer: Self-pay | Admitting: Family Medicine

## 2023-09-05 ENCOUNTER — Ambulatory Visit: Admitting: Family Medicine

## 2023-09-05 VITALS — BP 122/94 | HR 97 | Ht 61.0 in | Wt 142.0 lb

## 2023-09-05 DIAGNOSIS — M9903 Segmental and somatic dysfunction of lumbar region: Secondary | ICD-10-CM | POA: Diagnosis not present

## 2023-09-05 DIAGNOSIS — M2550 Pain in unspecified joint: Secondary | ICD-10-CM

## 2023-09-05 DIAGNOSIS — M9901 Segmental and somatic dysfunction of cervical region: Secondary | ICD-10-CM

## 2023-09-05 DIAGNOSIS — Z800 Family history of malignant neoplasm of digestive organs: Secondary | ICD-10-CM | POA: Insufficient documentation

## 2023-09-05 DIAGNOSIS — M9908 Segmental and somatic dysfunction of rib cage: Secondary | ICD-10-CM | POA: Diagnosis not present

## 2023-09-05 DIAGNOSIS — M9902 Segmental and somatic dysfunction of thoracic region: Secondary | ICD-10-CM

## 2023-09-05 DIAGNOSIS — M9904 Segmental and somatic dysfunction of sacral region: Secondary | ICD-10-CM

## 2023-09-05 DIAGNOSIS — M48061 Spinal stenosis, lumbar region without neurogenic claudication: Secondary | ICD-10-CM | POA: Diagnosis not present

## 2023-09-05 DIAGNOSIS — I77819 Aortic ectasia, unspecified site: Secondary | ICD-10-CM

## 2023-09-05 LAB — COMPREHENSIVE METABOLIC PANEL WITH GFR
ALT: 26 U/L (ref 0–35)
AST: 28 U/L (ref 0–37)
Albumin: 4.7 g/dL (ref 3.5–5.2)
Alkaline Phosphatase: 63 U/L (ref 39–117)
BUN: 16 mg/dL (ref 6–23)
CO2: 28 meq/L (ref 19–32)
Calcium: 9.3 mg/dL (ref 8.4–10.5)
Chloride: 100 meq/L (ref 96–112)
Creatinine, Ser: 0.95 mg/dL (ref 0.40–1.20)
GFR: 65.04 mL/min (ref >60.00)
Glucose, Bld: 92 mg/dL (ref 70–99)
Potassium: 4.4 meq/L (ref 3.5–5.1)
Sodium: 135 meq/L (ref 135–145)
Total Bilirubin: 0.9 mg/dL (ref 0.2–1.2)
Total Protein: 7 g/dL (ref 6.0–8.3)

## 2023-09-05 LAB — CBC WITH DIFFERENTIAL/PLATELET
Basophils Absolute: 0 K/uL (ref 0.0–0.1)
Basophils Relative: 0.6 % (ref 0.0–3.0)
Eosinophils Absolute: 0.4 K/uL (ref 0.0–0.7)
Eosinophils Relative: 7 % — ABNORMAL HIGH (ref 0.0–5.0)
HCT: 39.5 % (ref 36.0–46.0)
Hemoglobin: 13.3 g/dL (ref 12.0–15.0)
Lymphocytes Relative: 26.9 % (ref 12.0–46.0)
Lymphs Abs: 1.4 K/uL (ref 0.7–4.0)
MCHC: 33.7 g/dL (ref 30.0–36.0)
MCV: 98.2 fl (ref 78.0–100.0)
Monocytes Absolute: 0.6 K/uL (ref 0.1–1.0)
Monocytes Relative: 11.3 % (ref 3.0–12.0)
Neutro Abs: 2.9 K/uL (ref 1.4–7.7)
Neutrophils Relative %: 54.2 % (ref 43.0–77.0)
Platelets: 305 K/uL (ref 150.0–400.0)
RBC: 4.02 Mil/uL (ref 3.87–5.11)
RDW: 12.2 % (ref 11.5–15.5)
WBC: 5.3 K/uL (ref 4.0–10.5)

## 2023-09-05 NOTE — Assessment & Plan Note (Signed)
 Father has had pancreatic cancer and will get labs to further evaluate.  Hopeful and we will having normal labs.  Having some reflux and has had some different stool recently.

## 2023-09-05 NOTE — Assessment & Plan Note (Signed)
 Patient is due to have another echocardiogram.  Patient is going to reach out to cardiologist.  Asymptomatic at this time.

## 2023-09-05 NOTE — Assessment & Plan Note (Signed)
 Continue stenosis noted.  Will need to consider the possibility of further imaging if worsening pain.  Patient has done well with conservative therapy as well as watching her weight overall.  Expecting patient to continue to do well hopefully.  Discussed icing regimen and home exercises.  Increase activity slowly.  Follow-up again in 6 to 8 weeks.

## 2023-09-05 NOTE — Patient Instructions (Signed)
 Labs Let me know about echo See me in 2 months

## 2023-09-07 ENCOUNTER — Other Ambulatory Visit (HOSPITAL_COMMUNITY)
Admission: RE | Admit: 2023-09-07 | Discharge: 2023-09-07 | Disposition: A | Discharge status: Home / Self Care | Source: Ambulatory Visit | Attending: Radiology | Admitting: Radiology

## 2023-09-07 ENCOUNTER — Other Ambulatory Visit: Payer: Self-pay | Admitting: Family Medicine

## 2023-09-07 ENCOUNTER — Ambulatory Visit (INDEPENDENT_AMBULATORY_CARE_PROVIDER_SITE_OTHER): Payer: Commercial Managed Care - HMO | Admitting: Radiology

## 2023-09-07 ENCOUNTER — Encounter: Payer: Self-pay | Admitting: Radiology

## 2023-09-07 VITALS — BP 120/82 | HR 87 | Ht 60.75 in | Wt 146.2 lb

## 2023-09-07 DIAGNOSIS — Z01419 Encounter for gynecological examination (general) (routine) without abnormal findings: Secondary | ICD-10-CM | POA: Insufficient documentation

## 2023-09-07 DIAGNOSIS — E2839 Other primary ovarian failure: Secondary | ICD-10-CM | POA: Diagnosis not present

## 2023-09-07 DIAGNOSIS — Z1331 Encounter for screening for depression: Secondary | ICD-10-CM | POA: Diagnosis not present

## 2023-09-07 DIAGNOSIS — Z800 Family history of malignant neoplasm of digestive organs: Secondary | ICD-10-CM

## 2023-09-07 LAB — CEA: CEA: <2 ng/mL

## 2023-09-07 LAB — CANCER ANTIGEN 19-9: CA 19-9: 60 U/mL — ABNORMAL HIGH (ref <34)

## 2023-09-07 NOTE — Progress Notes (Signed)
   Tamara [name] [birth year] [id number]   History: Postmenopausal 62 y.o. presents for annual exam. No gyn concerns.    Gynecologic History Postmenopausal Last Pap: 2020. Results were: normal Last mammogram: 4/24. Results were: normal Last colonoscopy: 2017   Obstetric History OB History  Gravida Para Term Preterm AB Living  1    1 0  SAB IAB Ectopic Multiple Live Births  1        # Outcome Date GA Lbr Len/2nd Weight Sex Type Anes PTL Lv  1 SAB      SAB   FD       09/07/2023   10:12 AM 03/22/2023   10:42 AM 02/22/2022    1:37 PM  Depression screen PHQ 2/9  Decreased Interest 0 0 0  Down, Depressed, Hopeless 0 0 1  PHQ - 2 Score 0 0 1  Altered sleeping  0   Tired, decreased energy  0   Change in appetite  0   Feeling bad or failure about yourself   0   Trouble concentrating  0   Moving slowly or fidgety/restless  0   Suicidal thoughts  0   PHQ-9 Score  0   Difficult doing work/chores  Not difficult at all      The following portions of the patient's history were reviewed and updated as appropriate: allergies, current medications, past family history, past medical history, past social history, past surgical history, and problem list.  Review of Systems Pertinent items noted in HPI and remainder of comprehensive ROS otherwise negative.  Past medical history, past surgical history, family history and social history were all reviewed and documented in the EPIC chart.  Exam:  Vitals:   09/07/23 1012  BP: 120/82  Pulse: 87  SpO2: 98%  Weight: 146 lb 3.2 oz (66.3 kg)  Height: 5' 0.75 (1.543 m)   Body mass index is 27.85 kg/m.  General appearance:  Normal Thyroid :  Symmetrical, normal in size, without palpable masses or nodularity. Respiratory  Auscultation:  Clear without wheezing or rhonchi Cardiovascular  Auscultation:  Regular rate, without rubs, murmurs or gallops  Edema/varicosities:  Not grossly evident Abdominal  Soft,nontender, without masses,  guarding or rebound.  Liver/spleen:  No organomegaly noted  Hernia:  None appreciated  Skin  Inspection:  Grossly normal Breasts: Examined lying and sitting.   Right: Without masses, retractions, nipple discharge or axillary adenopathy.   Left: Without masses, retractions, nipple discharge or axillary adenopathy. Genitourinary   Inguinal/mons:  Normal without inguinal adenopathy  External genitalia:  Normal appearing vulva with no masses, tenderness, or lesions  BUS/Urethra/Skene's glands:  Normal  Vagina:  Normal appearing with normal color and discharge, no lesions. Atrophy: moderate   Cervix:  Normal appearing without discharge or lesions  Uterus:  Normal in size, shape and contour.  Midline and mobile, nontender  Adnexa/parametria:     Rt: Normal in size, without masses or tenderness.   Lt: Normal in size, without masses or tenderness.  Anus and perineum: Normal    Darice Hoit, CMA present for exam  Assessment/Plan:   1. Well woman exam with routine gynecological exam (Primary) - Cytology - PAP( [place]) - Schedule overdue mammogram  2. Depression screen negative  3. Estrogen deficiency - DG Bone Density; Future    Return in 1 year for annual or sooner prn.  Desyre Calma B WHNP-BC, 10:19 AM 09/07/2023

## 2023-09-07 NOTE — Patient Instructions (Signed)
 Preventive Care 16-61 Years Old, Female  Preventive care refers to lifestyle choices and visits with your health care provider that can promote health and wellness. Preventive care visits are also called wellness exams.  What can I expect for my preventive care visit?  Counseling  Your health care provider may ask you questions about your:  Medical history, including:  Past medical problems.  Family medical history.  Pregnancy history.  Current health, including:  Menstrual cycle.  Method of birth control.  Emotional well-being.  Home life and relationship well-being.  Sexual activity and sexual health.  Lifestyle, including:  Alcohol, nicotine or tobacco, and drug use.  Access to firearms.  Diet, exercise, and sleep habits.  Work and work Astronomer.  Sunscreen use.  Safety issues such as seatbelt and bike helmet use.  Physical exam  Your health care provider will check your:  Height and weight. These may be used to calculate your BMI (body mass index). BMI is a measurement that tells if you are at a healthy weight.  Waist circumference. This measures the distance around your waistline. This measurement also tells if you are at a healthy weight and may help predict your risk of certain diseases, such as type 2 diabetes and high blood pressure.  Heart rate and blood pressure.  Body temperature.  Skin for abnormal spots.  What immunizations do I need?    Vaccines are usually given at various ages, according to a schedule. Your health care provider will recommend vaccines for you based on your age, medical history, and lifestyle or other factors, such as travel or where you work.  What tests do I need?  Screening  Your health care provider may recommend screening tests for certain conditions. This may include:  Lipid and cholesterol levels.  Diabetes screening. This is done by checking your blood sugar (glucose) after you have not eaten for a while (fasting).  Pelvic exam and Pap test.  Hepatitis B test.  Hepatitis C  test.  HIV (human immunodeficiency virus) test.  STI (sexually transmitted infection) testing, if you are at risk.  Lung cancer screening.  Colorectal cancer screening.  Mammogram. Talk with your health care provider about when you should start having regular mammograms. This may depend on whether you have a family history of breast cancer.  BRCA-related cancer screening. This may be done if you have a family history of breast, ovarian, tubal, or peritoneal cancers.  Bone density scan. This is done to screen for osteoporosis.  Talk with your health care provider about your test results, treatment options, and if necessary, the need for more tests.  Follow these instructions at home:  Eating and drinking    Eat a diet that includes fresh fruits and vegetables, whole grains, lean protein, and low-fat dairy products.  Take vitamin and mineral supplements as recommended by your health care provider.  Do not drink alcohol if:  Your health care provider tells you not to drink.  You are pregnant, may be pregnant, or are planning to become pregnant.  If you drink alcohol:  Limit how much you have to 0-1 drink a day.  Know how much alcohol is in your drink. In the [place] one drink equals one 12 oz bottle of beer (355 mL), one 5 oz glass of wine (148 mL), or one 1 oz glass of hard liquor (44 mL).  Lifestyle  Brush your teeth every morning and night with fluoride toothpaste. Floss one time each day.  Exercise for at least  30 minutes 5 or more days each week.  Do not use any products that contain nicotine or tobacco. These products include cigarettes, chewing tobacco, and vaping devices, such as e-cigarettes. If you need help quitting, ask your health care provider.  Do not use drugs.  If you are sexually active, practice safe sex. Use a condom or other form of protection to prevent STIs.  If you do not wish to become pregnant, use a form of birth control. If you plan to become pregnant, see your health care provider for a  prepregnancy visit.  Take aspirin only as told by your health care provider. Make sure that you understand how much to take and what form to take. Work with your health care provider to find out whether it is safe and beneficial for you to take aspirin daily.  Find healthy ways to manage stress, such as:  Meditation, yoga, or listening to music.  Journaling.  Talking to a trusted person.  Spending time with friends and family.  Minimize exposure to UV radiation to reduce your risk of skin cancer.  Safety  Always wear your seat belt while driving or riding in a vehicle.  Do not drive:  If you have been drinking alcohol. Do not ride with someone who has been drinking.  When you are tired or distracted.  While texting.  If you have been using any mind-altering substances or drugs.  Wear a helmet and other protective equipment during sports activities.  If you have firearms in your house, make sure you follow all gun safety procedures.  Seek help if you have been physically or sexually abused.  What's next?  Visit your health care provider once a year for an annual wellness visit.  Ask your health care provider how often you should have your eyes and teeth checked.  Stay up to date on all vaccines.  This information is not intended to replace advice given to you by your health care provider. Make sure you discuss any questions you have with your health care provider.  Document Revised: 07/29/2020 Document Reviewed: 07/29/2020  Elsevier Patient Education  2024 [employer].

## 2023-09-08 ENCOUNTER — Other Ambulatory Visit: Payer: Self-pay | Admitting: Family Medicine

## 2023-09-08 DIAGNOSIS — Z800 Family history of malignant neoplasm of digestive organs: Secondary | ICD-10-CM

## 2023-09-13 ENCOUNTER — Ambulatory Visit: Payer: Self-pay | Admitting: Radiology

## 2023-09-13 LAB — CYTOLOGY - PAP
Comment: NEGATIVE
Diagnosis: NEGATIVE
High risk HPV: NEGATIVE

## 2023-09-15 ENCOUNTER — Other Ambulatory Visit: Payer: Self-pay | Admitting: Internal Medicine

## 2023-09-15 DIAGNOSIS — Z1231 Encounter for screening mammogram for malignant neoplasm of breast: Secondary | ICD-10-CM

## 2023-10-02 ENCOUNTER — Ambulatory Visit
Admission: RE | Admit: 2023-10-02 | Discharge: 2023-10-02 | Disposition: A | Discharge status: Home / Self Care | Source: Ambulatory Visit | Attending: Family Medicine | Admitting: Family Medicine

## 2023-10-02 ENCOUNTER — Ambulatory Visit: Payer: Self-pay | Admitting: Family Medicine

## 2023-10-02 DIAGNOSIS — Z800 Family history of malignant neoplasm of digestive organs: Secondary | ICD-10-CM

## 2023-10-02 MED ORDER — IOPAMIDOL (ISOVUE-300) INJECTION 61%
100.0000 mL | Freq: Once | INTRAVENOUS | Status: AC | PRN
  Administered 2023-10-02: 100 mL via INTRAVENOUS

## 2023-10-05 ENCOUNTER — Ambulatory Visit
Admission: RE | Admit: 2023-10-05 | Discharge: 2023-10-05 | Disposition: A | Discharge status: Home / Self Care | Source: Ambulatory Visit | Attending: Internal Medicine | Admitting: Internal Medicine

## 2023-10-05 DIAGNOSIS — Z1231 Encounter for screening mammogram for malignant neoplasm of breast: Secondary | ICD-10-CM

## 2023-10-13 ENCOUNTER — Other Ambulatory Visit: Payer: Self-pay | Admitting: Family Medicine

## 2023-10-24 ENCOUNTER — Encounter: Payer: Self-pay | Admitting: Internal Medicine

## 2023-10-27 MED ORDER — COVID-19 MRNA VAC-TRIS(PFIZER) 30 MCG/0.3ML IM SUSY: 0.3000 mL | PREFILLED_SYRINGE | Freq: Once | INTRAMUSCULAR | 0 refills | Status: AC

## 2023-10-27 NOTE — Telephone Encounter (Signed)
 I have sent in for her but would need a separate note for husband if requesting same.

## 2023-10-30 ENCOUNTER — Encounter (HOSPITAL_BASED_OUTPATIENT_CLINIC_OR_DEPARTMENT_OTHER): Payer: Self-pay

## 2023-10-30 ENCOUNTER — Ambulatory Visit (HOSPITAL_BASED_OUTPATIENT_CLINIC_OR_DEPARTMENT_OTHER)
Admission: RE | Admit: 2023-10-30 | Discharge: 2023-10-30 | Disposition: A | Discharge status: Home / Self Care | Source: Ambulatory Visit | Attending: Radiology | Admitting: Radiology

## 2023-10-30 DIAGNOSIS — E2839 Other primary ovarian failure: Secondary | ICD-10-CM

## 2023-10-30 NOTE — Telephone Encounter (Signed)
 Can you please call them to update the code? And apologize they sent her away? They could have just changed the diagnosis code.

## 2023-10-31 ENCOUNTER — Other Ambulatory Visit: Payer: Self-pay

## 2023-10-31 DIAGNOSIS — Z780 Asymptomatic menopausal state: Secondary | ICD-10-CM

## 2023-10-31 MED ORDER — COVID-19 MRNA VAC-TRIS(PFIZER) 30 MCG/0.3ML IM SUSY
0.3000 mL | PREFILLED_SYRINGE | Freq: Once | INTRAMUSCULAR | 0 refills | Status: AC
  Filled 2023-10-31: qty 0.3, 1d supply, fill #0

## 2023-10-31 MED ORDER — FLUZONE 0.5 ML IM SUSY
0.5000 mL | PREFILLED_SYRINGE | Freq: Once | INTRAMUSCULAR | 0 refills | Status: AC
Start: 2023-10-31 — End: 2023-11-01
  Filled 2023-10-31: qty 0.5, 1d supply, fill #0

## 2023-10-31 NOTE — Telephone Encounter (Signed)
 Form faxed to [facility]. Confirmation received. No additional notes added. Patient is aware.

## 2023-10-31 NOTE — Telephone Encounter (Signed)
 Patient called back & said her insurance told her that we have to submit a prior authorization for the bone density. Please advise. Routing to [place], Armed forces technical officer.

## 2023-10-31 NOTE — Telephone Encounter (Signed)
 I called & spoke with imaging center. Order was unable to be changed so I had to put in a new order. Patient is aware that new order is placed with post menopausal as diagnosis. She will call them to scheduled.

## 2023-11-01 ENCOUNTER — Other Ambulatory Visit: Payer: Self-pay

## 2023-11-03 NOTE — Progress Notes (Deleted)
  [name] [address] [place] [address] Phone: [phone] Subjective:    I'm seeing this patient by the request  of:  Norleen Lynwood ORN, MD  CC:   YEP:Dlagzrupcz  Tamara [name] is a 63 y.o. female coming in with complaint of back and neck pain. OMT 09/05/2023. Patient states   Medications patient has been prescribed: Hydroxizine, Meloxicam   Taking:         Reviewed prior external information including notes and imaging from previsou exam, outside providers and external EMR if available.   As well as notes that were available from care everywhere and other healthcare systems.  Past medical history, social, surgical and family history all reviewed in electronic medical record.  No pertanent information unless stated regarding to the chief complaint.   Past Medical History:  Diagnosis Date   Allergic rhinitis, mild    Anemia    Depression    Hypertension    Ovarian cyst     Allergies  Allergen Reactions   Gabapentin  Swelling   Quinolones Other (See Comments)   Ciprofloxacin Other (See Comments)    Hx of asc. Aortic dilitation.   Family hx of Marfans     Review of Systems:  No headache, visual changes, nausea, vomiting, diarrhea, constipation, dizziness, abdominal pain, skin rash, fevers, chills, night sweats, weight loss, swollen lymph nodes, body aches, joint swelling, chest pain, shortness of breath, mood changes. POSITIVE muscle aches  Objective  Last menstrual period 06/28/2016.   General: No apparent distress alert and oriented x3 mood and affect normal, dressed appropriately.  HEENT: Pupils equal, extraocular movements intact  Respiratory: Patient's speak in full sentences and does not appear short of breath  Cardiovascular: No lower extremity edema, non tender, no erythema  Gait MSK:  Back   Osteopathic findings  C2 flexed rotated and side bent right C6 flexed rotated and side bent left T3 extended rotated  and side bent right inhaled rib T9 extended rotated and side bent left L2 flexed rotated and side bent right Sacrum right on right       Assessment and Plan:  No problem-specific Assessment & Plan notes found for this encounter.    Nonallopathic problems  Decision today to treat with OMT was based on Physical Exam  After verbal consent patient was treated with HVLA, ME, FPR techniques in cervical, rib, thoracic, lumbar, and sacral  areas  Patient tolerated the procedure well with improvement in symptoms  Patient given exercises, stretches and lifestyle modifications  See medications in patient instructions if given  Patient will follow up in 4-8 weeks             Note: This dictation was prepared with Dragon dictation along with smaller phrase technology. Any transcriptional errors that result from this process are unintentional.

## 2023-11-05 ENCOUNTER — Encounter: Payer: Self-pay | Admitting: Family Medicine

## 2023-11-06 ENCOUNTER — Ambulatory Visit: Admitting: Family Medicine

## 2023-11-16 ENCOUNTER — Encounter: Payer: Self-pay | Admitting: Family Medicine

## 2023-11-16 ENCOUNTER — Other Ambulatory Visit: Payer: Self-pay

## 2023-11-16 MED ORDER — MELOXICAM 15 MG PO TABS: 15.0000 mg | ORAL_TABLET | Freq: Every day | ORAL | 0 refills | Status: DC

## 2023-11-21 NOTE — Telephone Encounter (Signed)
 Spoke with Obe at [employer]. Was advised no PA required for CPT 77080 and 22918. Was advised scheduling facility also called [employer] this morning and was advised the same.   Ref for call: Obe t. 11/21/23 3:50PM EST

## 2023-11-21 NOTE — Telephone Encounter (Signed)
 Routing to Tamara [name] to advise if she receives any updates via fax.

## 2023-11-21 NOTE — Telephone Encounter (Signed)
 I have not received anything. I have never seen a PA needed to be done for DEXA before- has anyone called her insurance company to see what code it needs to be ordered under?

## 2023-11-21 NOTE — Telephone Encounter (Signed)
 See Bone density PA form scanned into EPIC on 11/08/23.   PA faxed again to [employer],  number provided on form. Fax confirmed.   This message was sent via FAXCOM, a product from [employer]. [email or web address].biscom.com/                    -------Fax Transmission Report-------  To:               Recipient at [phone] Subject:          SECURE: Bone Density PA Result:           The transmission was successful. Explanation:      All Pages Ok Pages Sent:       3 Connect Time:     1 minutes, 33 seconds Transmit Time:    11/21/2023 08:06 Transfer Rate:    14400 Status Code:      0000 Retry Count:      0 Job Id:           [id number] Unique Id:        [id number] Fax Line:         54 Fax Server:       [id number]

## 2023-12-14 ENCOUNTER — Other Ambulatory Visit (HOSPITAL_BASED_OUTPATIENT_CLINIC_OR_DEPARTMENT_OTHER)

## 2023-12-14 ENCOUNTER — Encounter (HOSPITAL_BASED_OUTPATIENT_CLINIC_OR_DEPARTMENT_OTHER): Payer: Self-pay

## 2024-01-01 ENCOUNTER — Telehealth: Payer: Self-pay

## 2024-01-01 NOTE — Telephone Encounter (Signed)
 Ok with me, but will need Dr Harley to agree as well.    Thanks

## 2024-01-01 NOTE — Progress Notes (Unsigned)
 [name] [address] [place] [address] Phone: [phone] Subjective:   ISusannah [name], am serving as a scribe for Dr. Arthea Claudene.  I'm seeing this patient by the request  of:  Norleen Lynwood ORN, MD  CC: Back and neck pain follow-up  YEP:Dlagzrupcz  Tamara [name] is a 63 y.o. female coming in with complaint of back and neck pain. OMT 09/05/2203. Patient states hurting today. Neck and low back discomfort. Also wants to talk about other things, recommendation for new PCP, how is meloxicam  different than ibuprofen, if continued meloxicam  could go to 90 days. Pain under R arm.  Medications patient has been prescribed: Hydroxyzine , Meloxicam   Taking:    MAYBE GET A NEW CA 19-9      Reviewed prior external information including notes and imaging from previsou exam, outside providers and external EMR if available.   As well as notes that were available from care everywhere and other healthcare systems.  Past medical history, social, surgical and family history all reviewed in electronic medical record.  No pertanent information unless stated regarding to the chief complaint.   Past Medical History:  Diagnosis Date   Allergic rhinitis, mild    Anemia    Depression    Hypertension    Ovarian cyst     Allergies  Allergen Reactions   Gabapentin  Swelling   Quinolones Other (See Comments)   Ciprofloxacin Other (See Comments)    Hx of asc. Aortic dilitation.   Family hx of Marfans     Review of Systems:  No headache, visual changes, nausea, vomiting, diarrhea, constipation, dizziness, abdominal pain, skin rash, fevers, chills, night sweats, weight loss, swollen lymph nodes, body aches, joint swelling, chest pain, shortness of breath, mood changes. POSITIVE muscle aches  Objective  Blood pressure 122/84, pulse 100, height 5' (1.524 m), weight 148 lb (67.1 kg), last menstrual period 06/28/2016, SpO2 98%.   General: No apparent  distress alert and oriented x3 mood and affect normal, dressed appropriately.  HEENT: Pupils equal, extraocular movements intact  Respiratory: Patient's speak in full sentences and does not appear short of breath  Cardiovascular: No lower extremity edema, non tender, no erythema  Gait relatively normal MSK:  Back does have some loss lordosis noted.  Some tenderness to palpation in the paraspinal musculature.  Tightness with straight leg test.  Negative FABER test noted.  Osteopathic findings  C2 flexed rotated and side bent right C6 flexed rotated and side bent left C7 flexed rotated and side bent right T3 extended rotated and side bent right inhaled rib T9 extended rotated and side bent left L2 flexed rotated and side bent right L5 flexed rotated and side bent right Sacrum right on right   Assessment and Plan:  Family history of pancreatic cancer Patient has had elevation in the CA 19 and I do think repeat would be beneficial.  If continuing to be high an MRI of the abdomen and pelvis will be necessary.  Follow-up again 6 to 12 weeks otherwise.  Upper back pain Worsening upper back pain and neck pain that is multifactorial.  Potential increase in some stresses where discussed icing regimen and home exercises, which activities to do and which ones to avoid.  No change in medications.  Follow-up again in 6 to 8 weeks    Nonallopathic problems  Decision today to treat with OMT was based on Physical Exam  After verbal consent patient was treated with HVLA, ME, FPR techniques  in cervical, rib, thoracic, lumbar, and sacral  areas  Patient tolerated the procedure well with improvement in symptoms  Patient given exercises, stretches and lifestyle modifications  See medications in patient instructions if given  Patient will follow up in 4-8 weeks      The above documentation has been reviewed and is accurate and complete Arthea CHRISTELLA Sharps, DO        Note: This dictation was  prepared with Dragon dictation along with smaller phrase technology. Any transcriptional errors that result from this process are unintentional.

## 2024-01-01 NOTE — Telephone Encounter (Signed)
 Copied from CRM #[id number]. Topic: Appointments - Scheduling Inquiry for Clinic >> Jan 01, 2024  2:36 PM Rea ORN wrote: Reason for CRM: Pt called to ask if Dr. Garald would take her on as a new pt. I advised pt that he is not taking on new pt with Dr. Norleen leaving. Pt stated she seen him several times in the office when she couldn't see PCP. She would really like to establish with him. Please advise if she can establish, [phone].

## 2024-01-02 ENCOUNTER — Encounter: Payer: Self-pay | Admitting: Family Medicine

## 2024-01-02 ENCOUNTER — Ambulatory Visit: Admitting: Family Medicine

## 2024-01-02 VITALS — BP 122/84 | HR 100 | Ht 60.0 in | Wt 148.0 lb

## 2024-01-02 DIAGNOSIS — M549 Dorsalgia, unspecified: Secondary | ICD-10-CM

## 2024-01-02 DIAGNOSIS — Z800 Family history of malignant neoplasm of digestive organs: Secondary | ICD-10-CM | POA: Diagnosis not present

## 2024-01-02 DIAGNOSIS — M9904 Segmental and somatic dysfunction of sacral region: Secondary | ICD-10-CM | POA: Diagnosis not present

## 2024-01-02 DIAGNOSIS — M9902 Segmental and somatic dysfunction of thoracic region: Secondary | ICD-10-CM | POA: Diagnosis not present

## 2024-01-02 DIAGNOSIS — M9908 Segmental and somatic dysfunction of rib cage: Secondary | ICD-10-CM

## 2024-01-02 DIAGNOSIS — M9901 Segmental and somatic dysfunction of cervical region: Secondary | ICD-10-CM

## 2024-01-02 DIAGNOSIS — M9903 Segmental and somatic dysfunction of lumbar region: Secondary | ICD-10-CM | POA: Diagnosis not present

## 2024-01-02 LAB — CBC WITH DIFFERENTIAL/PLATELET
Basophils Absolute: 0 K/uL (ref 0.0–0.1)
Basophils Relative: 0.6 % (ref 0.0–3.0)
Eosinophils Absolute: 0.5 K/uL (ref 0.0–0.7)
Eosinophils Relative: 8.7 % — ABNORMAL HIGH (ref 0.0–5.0)
HCT: 40.8 % (ref 36.0–46.0)
Hemoglobin: 13.8 g/dL (ref 12.0–15.0)
Lymphocytes Relative: 28.9 % (ref 12.0–46.0)
Lymphs Abs: 1.5 K/uL (ref 0.7–4.0)
MCHC: 33.8 g/dL (ref 30.0–36.0)
MCV: 99 fl (ref 78.0–100.0)
Monocytes Absolute: 0.5 K/uL (ref 0.1–1.0)
Monocytes Relative: 9.3 % (ref 3.0–12.0)
Neutro Abs: 2.8 K/uL (ref 1.4–7.7)
Neutrophils Relative %: 52.5 % (ref 43.0–77.0)
Platelets: 324 K/uL (ref 150.0–400.0)
RBC: 4.12 Mil/uL (ref 3.87–5.11)
RDW: 11.8 % (ref 11.5–15.5)
WBC: 5.3 K/uL (ref 4.0–10.5)

## 2024-01-02 LAB — SEDIMENTATION RATE: Sed Rate: 2 mm/h (ref 0–30)

## 2024-01-02 NOTE — Patient Instructions (Signed)
 Good to see you! Labs today See you again in 2 months

## 2024-01-02 NOTE — Assessment & Plan Note (Signed)
 Patient has had elevation in the CA 19 and I do think repeat would be beneficial.  If continuing to be high an MRI of the abdomen and pelvis will be necessary.  Follow-up again 6 to 12 weeks otherwise.

## 2024-01-02 NOTE — Assessment & Plan Note (Signed)
 Worsening upper back pain and neck pain that is multifactorial.  Potential increase in some stresses where discussed icing regimen and home exercises, which activities to do and which ones to avoid.  No change in medications.  Follow-up again in 6 to 8 weeks

## 2024-01-03 ENCOUNTER — Other Ambulatory Visit: Payer: Self-pay

## 2024-01-03 ENCOUNTER — Encounter: Payer: Self-pay | Admitting: Family Medicine

## 2024-01-03 LAB — CANCER ANTIGEN 19-9: CA 19-9: 61 U/mL — ABNORMAL HIGH (ref <34)

## 2024-01-03 MED ORDER — MELOXICAM 15 MG PO TABS: 15.0000 mg | ORAL_TABLET | Freq: Every day | ORAL | 0 refills | Status: DC

## 2024-01-04 ENCOUNTER — Ambulatory Visit: Payer: Self-pay | Admitting: Family Medicine

## 2024-01-08 ENCOUNTER — Other Ambulatory Visit: Payer: Self-pay

## 2024-01-08 DIAGNOSIS — R978 Other abnormal tumor markers: Secondary | ICD-10-CM

## 2024-01-08 DIAGNOSIS — R109 Unspecified abdominal pain: Secondary | ICD-10-CM

## 2024-01-08 DIAGNOSIS — R102 Pelvic and perineal pain unspecified side: Secondary | ICD-10-CM

## 2024-01-09 ENCOUNTER — Other Ambulatory Visit: Payer: Self-pay

## 2024-01-09 ENCOUNTER — Telehealth: Payer: Self-pay

## 2024-01-09 DIAGNOSIS — R109 Unspecified abdominal pain: Secondary | ICD-10-CM

## 2024-01-09 NOTE — Telephone Encounter (Signed)
 Patients MRI's Are being sent to denial because of reasons stated below.  MRI Abdomen 25818 A picture study taken without dye would be insufficient for evaluating the area of concern listed in the clinical information provided. Two sets of pictures (without dye, then with dye) are needed. The following code(s) will be approved if requested: -25816- An MRI of your belly without and with the use of dye to enhance the image.  MRI Pelvis [id number]  Your patient recently had the same study or one very similar.  After viewing those results, we determined that study should have been sufficient. No additional imaging is needed at this time.  Your patient recently had an MRI scan approved for them. An approval letter was sent to them. We need to see evidence that the prior MRI was insufficient, or confirmation that the prior approval was not and will not be used. Without that information, we cannot approve another study.  One body region was requested, but a different body region is supported to evaluate the area of concern in the clinical information provided.  The only imaging I see is CT abdomen pelvis.   Can we switch the first MRI to MRI abdomen with and without as for the second imaging I can do an appeal if you can give me a reason or do a peer to peer

## 2024-01-15 NOTE — Telephone Encounter (Signed)
 Will look into this further tomorrow. I was looking through the denial reason and it states that patient has an MRI already approved on file but I do not see that looking over what's in EPIC.

## 2024-01-17 NOTE — Telephone Encounter (Signed)
 Patient has since been scheduled with Geni Shutter DO

## 2024-01-19 NOTE — Telephone Encounter (Signed)
 After extensive searching appeals letter written and faxed to [employer]

## 2024-01-22 NOTE — Telephone Encounter (Signed)
 FYI MRI abdomen with and without approved MRI pelvis is still not approved after appeal letter stating the requested body region would not show your doctor details needed to treat you. That the MRI abdomen is what is needed, which is already approved.

## 2024-02-12 ENCOUNTER — Other Ambulatory Visit: Payer: Self-pay | Admitting: Internal Medicine

## 2024-02-15 ENCOUNTER — Other Ambulatory Visit: Payer: Self-pay | Admitting: Family Medicine

## 2024-02-21 ENCOUNTER — Encounter: Admitting: Family Medicine

## 2024-02-28 NOTE — Progress Notes (Signed)
 " [name] [address] [place] [address] Phone: [phone] Subjective:   LILLETTE Berwyn Posey, am serving as a scribe for Dr. Arthea Claudene.  I'm seeing this patient by the request  of:  Norleen Lynwood ORN, MD  CC: Back and neck pain follow-up, right shoulder  YEP:Dlagzrupcz  VEORA FONTE is a 64 y.o. female coming in with complaint of back and neck pain. OMT 01/02/2024. Patient states that her neck and R shoulder seem to be a bit worse. Pain started in axilla but now surrounds the joint with flexion and IR.   Hands have been hurting as well over CMC joints.   Needs a refill of meloxicam .   Medications patient has been prescribed: Hydroxizine, Meloxicam , Effexor   Taking:         Reviewed prior external information including notes and imaging from previsou exam, outside providers and external EMR if available.   As well as notes that were available from care everywhere and other healthcare systems.  Past medical history, social, surgical and family history all reviewed in electronic medical record.  No pertanent information unless stated regarding to the chief complaint.   Past Medical History:  Diagnosis Date   Allergic rhinitis, mild    Anemia    Depression    Hypertension    Ovarian cyst     Allergies[1]   Review of Systems:  No headache, visual changes, nausea, vomiting, diarrhea, constipation, dizziness,  skin rash, fevers, chills, night sweats, weight loss, swollen lymph nodes, joint swelling, chest pain, shortness of breath, mood changes. POSITIVE muscle aches, fatigue, body aches, abdominal pain  Objective  Blood pressure 118/78, pulse 94, height 5' (1.524 m), weight 147 lb (66.7 kg), last menstrual period 06/28/2016, SpO2 98%.   General: No apparent distress alert and oriented x3 mood and affect normal, dressed appropriately.  HEENT: Pupils equal, extraocular movements intact  Respiratory: Patient's speak in full  sentences and does not appear short of breath  Cardiovascular: No lower extremity edema, non tender, no erythema  Low back does have some loss of lordosis noted.  Some tenderness to palpation in the paraspinal musculature.  Tightness noted with certain range of motion.  Neck exam does have some limited sidebending bilaterally. Right shoulder exam shows positive   Osteopathic findings  C2 flexed rotated and side bent right C6 flexed rotated and side bent left T3 extended rotated and side bent right inhaled rib T9 extended rotated and side bent left L2 flexed rotated and side bent right Sacrum right on right   Procedure: Real-time Ultrasound Guided Injection of right arm and clavicular joint Device: GE Logiq Q7 Ultrasound guided injection is preferred based studies that show increased duration, increased effect, greater accuracy, decreased procedural pain, increased response rate, and decreased cost with ultrasound guided versus blind injection.  Verbal informed consent obtained.  Time-out conducted.  Noted no overlying erythema, induration, or other signs of local infection.  Skin prepped in a sterile fashion.  Local anesthesia: Topical Ethyl chloride.  With sterile technique and under real time ultrasound guidance: With a 25-gauge half inch needle injected with 0.5 cc of 0.5% Marcaine and 0.5 cc of Kenalog  40 mg/mL Completed without difficulty  Pain immediately resolved suggesting accurate placement of the medication.  Advised to call if fevers/chills, erythema, induration, drainage, or persistent bleeding.  Images saved Impression: Technically successful ultrasound guided injection.    Assessment and Plan:  Family history of pancreatic cancer Continue send a family history  with an elevated CA 19-9.  Concerned that this is giving her some more of the pain and back pain.  Do feel need more specificity to further evaluate with an MRI of abdomen with contrast please.  Will try to order  this and patient will follow-up after imaging to discuss different treatment options or refer accordingly.  Stenosis of lateral recess of lumbar spine Known arthritic changes, discussed with patient about icing regimen and home exercises, discussed which activities to do and which ones to avoid.  Patient will continue work on core strengthening.  Follow-up again in 6 to 8 weeks    Nonallopathic problems  Decision today to treat with OMT was based on Physical Exam  After verbal consent patient was treated with HVLA, ME, FPR techniques in cervical, rib, thoracic, lumbar, and sacral  areas  Patient tolerated the procedure well with improvement in symptoms  Patient given exercises, stretches and lifestyle modifications  See medications in patient instructions if given  Patient will follow up in 4-8 weeks     The above documentation has been reviewed and is accurate and complete Bolden Hagerman M Dyan Creelman, DO         Note: This dictation was prepared with Dragon dictation along with smaller phrase technology. Any transcriptional errors that result from this process are unintentional.            [1]  Allergies Allergen Reactions   Gabapentin  Swelling   Quinolones Other (See Comments)   Ciprofloxacin Other (See Comments)    Hx of asc. Aortic dilitation.   Family hx of Marfans   "

## 2024-03-01 ENCOUNTER — Encounter: Payer: Self-pay | Admitting: Family Medicine

## 2024-03-05 ENCOUNTER — Encounter: Payer: Self-pay | Admitting: Family Medicine

## 2024-03-05 ENCOUNTER — Other Ambulatory Visit: Payer: Self-pay

## 2024-03-05 ENCOUNTER — Ambulatory Visit: Admitting: Family Medicine

## 2024-03-05 VITALS — BP 118/78 | HR 94 | Ht 60.0 in | Wt 147.0 lb

## 2024-03-05 DIAGNOSIS — M25511 Pain in right shoulder: Secondary | ICD-10-CM

## 2024-03-05 DIAGNOSIS — M9903 Segmental and somatic dysfunction of lumbar region: Secondary | ICD-10-CM

## 2024-03-05 DIAGNOSIS — M9902 Segmental and somatic dysfunction of thoracic region: Secondary | ICD-10-CM | POA: Diagnosis not present

## 2024-03-05 DIAGNOSIS — Z800 Family history of malignant neoplasm of digestive organs: Secondary | ICD-10-CM | POA: Diagnosis not present

## 2024-03-05 DIAGNOSIS — M9904 Segmental and somatic dysfunction of sacral region: Secondary | ICD-10-CM

## 2024-03-05 DIAGNOSIS — R978 Other abnormal tumor markers: Secondary | ICD-10-CM

## 2024-03-05 DIAGNOSIS — M9901 Segmental and somatic dysfunction of cervical region: Secondary | ICD-10-CM

## 2024-03-05 DIAGNOSIS — M19011 Primary osteoarthritis, right shoulder: Secondary | ICD-10-CM | POA: Diagnosis not present

## 2024-03-05 DIAGNOSIS — M48061 Spinal stenosis, lumbar region without neurogenic claudication: Secondary | ICD-10-CM | POA: Diagnosis not present

## 2024-03-05 MED ORDER — MELOXICAM 15 MG PO TABS
15.0000 mg | ORAL_TABLET | Freq: Every day | ORAL | 0 refills | Status: AC
  Filled 2024-03-05: qty 30, 30d supply, fill #0

## 2024-03-05 NOTE — Assessment & Plan Note (Signed)
 No known arthritic changes.  Discussed with patient about icing regimen and home exercises, discussed avoidance of activities.  Follow-up with me in 6 to 12 weeks

## 2024-03-05 NOTE — Assessment & Plan Note (Signed)
 Known arthritic changes, discussed with patient about icing regimen and home exercises, discussed which activities to do and which ones to avoid.  Patient will continue work on core strengthening.  Follow-up again in 6 to 8 weeks

## 2024-03-05 NOTE — Patient Instructions (Addendum)
 Injected [facility] joint Exercises MRI ab [phone] See me again in 6-8 weeks

## 2024-03-05 NOTE — Assessment & Plan Note (Signed)
 Continue send a family history with an elevated CA 19-9.  Concerned that this is giving her some more of the pain and back pain.  Do feel need more specificity to further evaluate with an MRI of abdomen with contrast please.  Will try to order this and patient will follow-up after imaging to discuss different treatment options or refer accordingly.

## 2024-03-06 ENCOUNTER — Encounter: Payer: Self-pay | Admitting: Family Medicine

## 2024-03-06 NOTE — Addendum Note (Signed)
 Addended by: GALA BERWYN CROME on: 03/06/2024 12:43 PM   Modules accepted: Orders

## 2024-03-06 NOTE — Addendum Note (Signed)
 Addended by: GEROME ASHLEY SAUNDERS on: 03/06/2024 08:57 AM   Modules accepted: Orders

## 2024-03-13 ENCOUNTER — Encounter: Payer: Self-pay | Admitting: Family Medicine

## 2024-03-26 ENCOUNTER — Encounter: Admitting: Family Medicine

## 2024-03-29 ENCOUNTER — Ambulatory Visit (HOSPITAL_BASED_OUTPATIENT_CLINIC_OR_DEPARTMENT_OTHER)

## 2024-04-18 ENCOUNTER — Ambulatory Visit: Admitting: Family Medicine

## 2024-09-10 ENCOUNTER — Ambulatory Visit: Admitting: Radiology
